# Patient Record
Sex: Male | Born: 1960 | Race: White | Hispanic: No | Marital: Married | Smoking: Never smoker
Health system: Southern US, Community
[De-identification: ages and names within clinical notes are randomized; demographics above are authoritative.]

## PROBLEM LIST (undated history)

## (undated) DIAGNOSIS — E785 Hyperlipidemia, unspecified: Secondary | ICD-10-CM

## (undated) DIAGNOSIS — J209 Acute bronchitis, unspecified: Secondary | ICD-10-CM

## (undated) DIAGNOSIS — M65839 Other synovitis and tenosynovitis, unspecified forearm: Secondary | ICD-10-CM

## (undated) DIAGNOSIS — R062 Wheezing: Secondary | ICD-10-CM

## (undated) DIAGNOSIS — K649 Unspecified hemorrhoids: Secondary | ICD-10-CM

## (undated) DIAGNOSIS — IMO0002 Reserved for concepts with insufficient information to code with codable children: Secondary | ICD-10-CM

## (undated) DIAGNOSIS — E1165 Type 2 diabetes mellitus with hyperglycemia: Secondary | ICD-10-CM

## (undated) DIAGNOSIS — H6690 Otitis media, unspecified, unspecified ear: Secondary | ICD-10-CM

## (undated) DIAGNOSIS — K429 Umbilical hernia without obstruction or gangrene: Secondary | ICD-10-CM

## (undated) DIAGNOSIS — I10 Essential (primary) hypertension: Secondary | ICD-10-CM

## (undated) DIAGNOSIS — M65849 Other synovitis and tenosynovitis, unspecified hand: Secondary | ICD-10-CM

## (undated) DIAGNOSIS — J309 Allergic rhinitis, unspecified: Secondary | ICD-10-CM

## (undated) HISTORY — DX: Essential (primary) hypertension: I10

## (undated) HISTORY — DX: Morbid (severe) obesity due to excess calories: E66.01

## (undated) HISTORY — DX: Unspecified hemorrhoids: K64.9

## (undated) HISTORY — DX: Acute bronchitis, unspecified: J20.9

## (undated) HISTORY — DX: Wheezing: R06.2

## (undated) HISTORY — PX: WISDOM TOOTH EXTRACTION: SHX21

## (undated) HISTORY — DX: Allergic rhinitis, unspecified: J30.9

## (undated) HISTORY — DX: Other synovitis and tenosynovitis, unspecified hand: M65.849

## (undated) HISTORY — DX: Type 2 diabetes mellitus with hyperglycemia: E11.65

## (undated) HISTORY — DX: Hyperlipidemia, unspecified: E78.5

## (undated) HISTORY — DX: Umbilical hernia without obstruction or gangrene: K42.9

## (undated) HISTORY — DX: Otitis media, unspecified, unspecified ear: H66.90

## (undated) HISTORY — DX: Reserved for concepts with insufficient information to code with codable children: IMO0002

## (undated) HISTORY — DX: Other synovitis and tenosynovitis, unspecified forearm: M65.839

---

## 1999-03-19 ENCOUNTER — Encounter: Payer: Self-pay | Admitting: Internal Medicine

## 1999-03-19 ENCOUNTER — Ambulatory Visit (HOSPITAL_COMMUNITY): Admission: RE | Admit: 1999-03-19 | Discharge: 1999-03-19 | Payer: Self-pay | Admitting: Internal Medicine

## 2004-08-21 ENCOUNTER — Ambulatory Visit: Payer: Self-pay | Admitting: Internal Medicine

## 2005-10-02 ENCOUNTER — Ambulatory Visit: Payer: Self-pay | Admitting: Family Medicine

## 2006-03-03 ENCOUNTER — Ambulatory Visit: Payer: Self-pay | Admitting: Internal Medicine

## 2006-03-10 ENCOUNTER — Ambulatory Visit: Payer: Self-pay | Admitting: Internal Medicine

## 2006-10-05 ENCOUNTER — Ambulatory Visit: Payer: Self-pay | Admitting: Internal Medicine

## 2006-12-07 ENCOUNTER — Ambulatory Visit: Payer: Self-pay | Admitting: Endocrinology

## 2007-07-03 ENCOUNTER — Encounter: Payer: Self-pay | Admitting: *Deleted

## 2007-07-03 DIAGNOSIS — I10 Essential (primary) hypertension: Secondary | ICD-10-CM

## 2007-07-03 DIAGNOSIS — K649 Unspecified hemorrhoids: Secondary | ICD-10-CM

## 2007-07-03 HISTORY — DX: Unspecified hemorrhoids: K64.9

## 2007-07-03 HISTORY — DX: Morbid (severe) obesity due to excess calories: E66.01

## 2007-07-03 HISTORY — DX: Essential (primary) hypertension: I10

## 2007-08-29 ENCOUNTER — Ambulatory Visit: Payer: Self-pay | Admitting: Internal Medicine

## 2007-08-29 LAB — CONVERTED CEMR LAB
ALT: 33 units/L (ref 0–53)
AST: 26 units/L (ref 0–37)
Albumin: 4 g/dL (ref 3.5–5.2)
Alkaline Phosphatase: 54 units/L (ref 39–117)
BUN: 9 mg/dL (ref 6–23)
Bacteria, UA: NEGATIVE
Basophils Absolute: 0 10*3/uL (ref 0.0–0.1)
Basophils Relative: 0.2 % (ref 0.0–1.0)
Bilirubin Urine: NEGATIVE
Bilirubin, Direct: 0.1 mg/dL (ref 0.0–0.3)
CO2: 28 meq/L (ref 19–32)
Calcium: 9.1 mg/dL (ref 8.4–10.5)
Chloride: 104 meq/L (ref 96–112)
Cholesterol: 159 mg/dL (ref 0–200)
Creatinine, Ser: 1 mg/dL (ref 0.4–1.5)
Crystals: NEGATIVE
Eosinophils Absolute: 0.3 10*3/uL (ref 0.0–0.6)
Eosinophils Relative: 3.1 % (ref 0.0–5.0)
GFR calc Af Amer: 104 mL/min
GFR calc non Af Amer: 86 mL/min
Glucose, Bld: 98 mg/dL (ref 70–99)
HCT: 40.6 % (ref 39.0–52.0)
HDL: 34.4 mg/dL — ABNORMAL LOW (ref >39.0)
Hemoglobin: 14.1 g/dL (ref 13.0–17.0)
Ketones, ur: NEGATIVE mg/dL
LDL Cholesterol: 107 mg/dL — ABNORMAL HIGH (ref 0–99)
Leukocytes, UA: NEGATIVE
Lymphocytes Relative: 26 % (ref 12.0–46.0)
MCHC: 34.7 g/dL (ref 30.0–36.0)
MCV: 86.8 fL (ref 78.0–100.0)
Monocytes Absolute: 0.6 10*3/uL (ref 0.2–0.7)
Monocytes Relative: 6 % (ref 3.0–11.0)
Neutro Abs: 6.1 10*3/uL (ref 1.4–7.7)
Neutrophils Relative %: 64.7 % (ref 43.0–77.0)
Nitrite: NEGATIVE
PSA: 0.3 ng/mL (ref 0.10–4.00)
Platelets: 300 10*3/uL (ref 150–400)
Potassium: 4.2 meq/L (ref 3.5–5.1)
RBC: 4.67 M/uL (ref 4.22–5.81)
RDW: 12.4 % (ref 11.5–14.6)
Sodium: 140 meq/L (ref 135–145)
Specific Gravity, Urine: 1.025 (ref 1.000–1.03)
Squamous Epithelial / HPF: NEGATIVE /lpf
TSH: 1.48 microintl units/mL (ref 0.35–5.50)
Total Bilirubin: 0.6 mg/dL (ref 0.3–1.2)
Total CHOL/HDL Ratio: 4.6
Total Protein, Urine: NEGATIVE mg/dL
Total Protein: 7.3 g/dL (ref 6.0–8.3)
Triglycerides: 87 mg/dL (ref 0–149)
Urine Glucose: NEGATIVE mg/dL
Urobilinogen, UA: 0.2 (ref 0.0–1.0)
VLDL: 17 mg/dL (ref 0–40)
WBC, UA: NONE SEEN cells/hpf
WBC: 9.4 10*3/uL (ref 4.5–10.5)
pH: 5.5 (ref 5.0–8.0)

## 2007-09-06 ENCOUNTER — Ambulatory Visit: Payer: Self-pay | Admitting: Internal Medicine

## 2007-09-06 DIAGNOSIS — E785 Hyperlipidemia, unspecified: Secondary | ICD-10-CM | POA: Insufficient documentation

## 2007-09-06 DIAGNOSIS — J309 Allergic rhinitis, unspecified: Secondary | ICD-10-CM

## 2007-09-06 HISTORY — DX: Allergic rhinitis, unspecified: J30.9

## 2007-09-06 HISTORY — DX: Hyperlipidemia, unspecified: E78.5

## 2007-11-16 ENCOUNTER — Ambulatory Visit: Payer: Self-pay | Admitting: Internal Medicine

## 2007-11-16 DIAGNOSIS — J209 Acute bronchitis, unspecified: Secondary | ICD-10-CM

## 2007-11-16 HISTORY — DX: Acute bronchitis, unspecified: J20.9

## 2008-06-23 ENCOUNTER — Emergency Department (HOSPITAL_COMMUNITY): Admission: EM | Admit: 2008-06-23 | Discharge: 2008-06-23 | Payer: Self-pay | Admitting: Emergency Medicine

## 2008-07-30 ENCOUNTER — Ambulatory Visit: Payer: Self-pay | Admitting: Internal Medicine

## 2008-07-30 DIAGNOSIS — IMO0002 Reserved for concepts with insufficient information to code with codable children: Secondary | ICD-10-CM | POA: Insufficient documentation

## 2008-07-30 HISTORY — DX: Reserved for concepts with insufficient information to code with codable children: IMO0002

## 2008-07-30 LAB — CONVERTED CEMR LAB
ALT: 27 units/L (ref 0–53)
AST: 23 units/L (ref 0–37)
Albumin: 3.9 g/dL (ref 3.5–5.2)
Alkaline Phosphatase: 51 units/L (ref 39–117)
Bilirubin, Direct: 0.2 mg/dL (ref 0.0–0.3)
Cholesterol: 150 mg/dL (ref 0–200)
HDL: 30.1 mg/dL — ABNORMAL LOW (ref >39.0)
LDL Cholesterol: 92 mg/dL (ref 0–99)
PSA: 1.21 ng/mL (ref 0.10–4.00)
Total Bilirubin: 0.6 mg/dL (ref 0.3–1.2)
Total CHOL/HDL Ratio: 5
Total Protein: 7.2 g/dL (ref 6.0–8.3)
Triglycerides: 141 mg/dL (ref 0–149)
VLDL: 28 mg/dL (ref 0–40)

## 2009-05-08 ENCOUNTER — Telehealth (INDEPENDENT_AMBULATORY_CARE_PROVIDER_SITE_OTHER): Payer: Self-pay | Admitting: *Deleted

## 2009-05-09 ENCOUNTER — Telehealth (INDEPENDENT_AMBULATORY_CARE_PROVIDER_SITE_OTHER): Payer: Self-pay | Admitting: *Deleted

## 2009-05-12 ENCOUNTER — Telehealth: Payer: Self-pay | Admitting: Internal Medicine

## 2009-06-06 ENCOUNTER — Telehealth: Payer: Self-pay | Admitting: Internal Medicine

## 2009-06-16 ENCOUNTER — Ambulatory Visit: Payer: Self-pay | Admitting: Internal Medicine

## 2009-06-17 LAB — CONVERTED CEMR LAB
ALT: 39 units/L (ref 0–53)
AST: 37 units/L (ref 0–37)
Albumin: 3.8 g/dL (ref 3.5–5.2)
Alkaline Phosphatase: 53 units/L (ref 39–117)
BUN: 7 mg/dL (ref 6–23)
Basophils Absolute: 0 10*3/uL (ref 0.0–0.1)
Basophils Relative: 0.2 % (ref 0.0–3.0)
Bilirubin Urine: NEGATIVE
Bilirubin, Direct: 0.1 mg/dL (ref 0.0–0.3)
CO2: 29 meq/L (ref 19–32)
Calcium: 9 mg/dL (ref 8.4–10.5)
Chloride: 106 meq/L (ref 96–112)
Cholesterol: 160 mg/dL (ref 0–200)
Creatinine, Ser: 1 mg/dL (ref 0.4–1.5)
Eosinophils Absolute: 0.3 10*3/uL (ref 0.0–0.7)
Eosinophils Relative: 4.7 % (ref 0.0–5.0)
GFR calc non Af Amer: 85.2 mL/min (ref >60)
Glucose, Bld: 111 mg/dL — ABNORMAL HIGH (ref 70–99)
HCT: 41.5 % (ref 39.0–52.0)
HDL: 36 mg/dL — ABNORMAL LOW (ref >39.00)
Hemoglobin: 14.2 g/dL (ref 13.0–17.0)
Ketones, ur: NEGATIVE mg/dL
LDL Cholesterol: 107 mg/dL — ABNORMAL HIGH (ref 0–99)
Leukocytes, UA: NEGATIVE
Lymphocytes Relative: 33.1 % (ref 12.0–46.0)
Lymphs Abs: 2.3 10*3/uL (ref 0.7–4.0)
MCHC: 34.1 g/dL (ref 30.0–36.0)
MCV: 88.8 fL (ref 78.0–100.0)
Monocytes Absolute: 0.5 10*3/uL (ref 0.1–1.0)
Monocytes Relative: 7.3 % (ref 3.0–12.0)
Neutro Abs: 3.9 10*3/uL (ref 1.4–7.7)
Neutrophils Relative %: 54.7 % (ref 43.0–77.0)
Nitrite: NEGATIVE
PSA: 0.64 ng/mL (ref 0.10–4.00)
Platelets: 236 10*3/uL (ref 150.0–400.0)
Potassium: 3.8 meq/L (ref 3.5–5.1)
RBC: 4.68 M/uL (ref 4.22–5.81)
RDW: 12.5 % (ref 11.5–14.6)
Sodium: 142 meq/L (ref 135–145)
Specific Gravity, Urine: 1.025 (ref 1.000–1.030)
TSH: 1.91 microintl units/mL (ref 0.35–5.50)
Total Bilirubin: 0.8 mg/dL (ref 0.3–1.2)
Total CHOL/HDL Ratio: 4
Total Protein, Urine: NEGATIVE mg/dL
Total Protein: 7.5 g/dL (ref 6.0–8.3)
Triglycerides: 83 mg/dL (ref 0.0–149.0)
Urine Glucose: 250 mg/dL
Urobilinogen, UA: 0.2 (ref 0.0–1.0)
VLDL: 16.6 mg/dL (ref 0.0–40.0)
WBC: 7 10*3/uL (ref 4.5–10.5)
pH: 6 (ref 5.0–8.0)

## 2009-06-18 ENCOUNTER — Ambulatory Visit: Payer: Self-pay | Admitting: Internal Medicine

## 2009-09-09 ENCOUNTER — Ambulatory Visit: Payer: Self-pay | Admitting: Internal Medicine

## 2009-09-09 DIAGNOSIS — R062 Wheezing: Secondary | ICD-10-CM

## 2009-09-09 HISTORY — DX: Wheezing: R06.2

## 2009-11-10 ENCOUNTER — Ambulatory Visit: Payer: Self-pay | Admitting: Internal Medicine

## 2009-11-10 DIAGNOSIS — H6690 Otitis media, unspecified, unspecified ear: Secondary | ICD-10-CM | POA: Insufficient documentation

## 2009-11-10 HISTORY — DX: Otitis media, unspecified, unspecified ear: H66.90

## 2010-06-11 ENCOUNTER — Ambulatory Visit: Payer: Self-pay | Admitting: Internal Medicine

## 2010-06-11 DIAGNOSIS — M65839 Other synovitis and tenosynovitis, unspecified forearm: Secondary | ICD-10-CM

## 2010-06-11 DIAGNOSIS — M65849 Other synovitis and tenosynovitis, unspecified hand: Secondary | ICD-10-CM

## 2010-06-11 HISTORY — DX: Other synovitis and tenosynovitis, unspecified forearm: M65.839

## 2010-08-31 ENCOUNTER — Ambulatory Visit: Payer: Self-pay | Admitting: Internal Medicine

## 2010-09-30 ENCOUNTER — Ambulatory Visit: Payer: Self-pay | Admitting: Internal Medicine

## 2010-09-30 DIAGNOSIS — E559 Vitamin D deficiency, unspecified: Secondary | ICD-10-CM

## 2010-10-02 LAB — CONVERTED CEMR LAB
ALT: 33 units/L (ref 0–53)
Alkaline Phosphatase: 62 units/L (ref 39–117)
Basophils Relative: 0.2 % (ref 0.0–3.0)
Bilirubin, Direct: 0.1 mg/dL (ref 0.0–0.3)
Chloride: 105 meq/L (ref 96–112)
Cholesterol: 136 mg/dL (ref 0–200)
Creatinine, Ser: 0.9 mg/dL (ref 0.4–1.5)
Eosinophils Relative: 3.6 % (ref 0.0–5.0)
LDL Cholesterol: 89 mg/dL (ref 0–99)
Leukocytes, UA: NEGATIVE
MCV: 88.6 fL (ref 78.0–100.0)
Monocytes Absolute: 0.4 10*3/uL (ref 0.1–1.0)
Neutrophils Relative %: 64.9 % (ref 43.0–77.0)
Nitrite: NEGATIVE
PSA: 0.38 ng/mL (ref 0.10–4.00)
Potassium: 4.5 meq/L (ref 3.5–5.1)
RBC: 4.59 M/uL (ref 4.22–5.81)
Sodium: 139 meq/L (ref 135–145)
Total CHOL/HDL Ratio: 4
Total Protein: 7 g/dL (ref 6.0–8.3)
Urobilinogen, UA: 0.2 (ref 0.0–1.0)
WBC: 8.5 10*3/uL (ref 4.5–10.5)

## 2010-10-06 ENCOUNTER — Ambulatory Visit
Admission: RE | Admit: 2010-10-06 | Discharge: 2010-10-06 | Payer: Self-pay | Source: Home / Self Care | Attending: Internal Medicine | Admitting: Internal Medicine

## 2010-10-06 ENCOUNTER — Encounter: Payer: Self-pay | Admitting: Internal Medicine

## 2010-10-06 LAB — CONVERTED CEMR LAB
HDL goal, serum: 40 mg/dL
LDL Goal: 130 mg/dL

## 2010-11-03 NOTE — Assessment & Plan Note (Signed)
Summary: COUGH--CONGESTION  STC   Vital Signs:  Patient profile:   51 year old male Height:      71 inches Weight:      337 pounds BMI:     47.17 O2 Sat:      96 % on Room air Temp:     98.2 degrees F oral Pulse rate:   85 / minute BP sitting:   108 / 60  (left arm) Cuff size:   large  Vitals Entered ByZella Ball Ewing (November 10, 2009 4:06 PM)  O2 Flow:  Room air CC: congestion and cough/RE   CC:  congestion and cough/RE.  History of Present Illness: here with 3 days onset severe URI adn left ear pain , pressure, feverish with mild ST and prod cough greenish sputum; no wheezing and Pt denies CP, sob, doe, wheezing, orthopnea, pnd, worsening LE edema, palps, dizziness or syncope   Overall good complaicne with meds, tolerating well, no other signfiicant symtpoms.  Pt denies new neuro symptoms such as headache, facial or extremity weakness   Problems Prior to Update: 1)  Bronchitis-acute  (ICD-466.0) 2)  Otitis Media, Acute, Left  (ICD-382.9) 3)  Wheezing  (ICD-786.07) 4)  Hepatotoxicity, Drug-induced, Risk of  (ICD-V58.69) 5)  Special Screening Malig Neoplasms Other Sites  (ICD-V76.49) 6)  Knee Sprain, Left  (ICD-844.9) 7)  Asthmatic Bronchitis, Acute  (ICD-466.0) 8)  Preventive Health Care  (ICD-V70.0) 9)  Allergic Rhinitis  (ICD-477.9) 10)  Hyperlipidemia  (ICD-272.4) 11)  Special Screening Malignant Neoplasm of Prostate  (ICD-V76.44) 12)  Routine General Medical Exam@[facility]  (ICD-V70.0) 13)  Obesity, Morbid  (ICD-278.01) 14)  Hemorrhoids  (ICD-455.6) 15)  Hypertension  (ICD-401.9)  Medications Prior to Update: 1)  Lisinopril 20 Mg  Tabs (Lisinopril) .... Take 1 By Mouth Once Daily 2)  Crestor 20 Mg  Tabs (Rosuvastatin Calcium) .Marland Kitchen.. 1 By Mouth Once Daily 3)  Ecotrin Low Strength 81 Mg  Tbec (Aspirin) .Marland Kitchen.. 1 By Mouth Qd 4)  Ibuprofen 800 Mg Tabs (Ibuprofen) .Marland Kitchen.. 1 By Mouth Q 6 Hr As Needed 5)  Azithromycin 250 Mg Tabs (Azithromycin) .... 2po Qd For 1 Day, Then  1po Qd For 4days, Then Stop 6)  Hydrocodone-Homatropine 5-1.5 Mg/46ml Syrp (Hydrocodone-Homatropine) .Marland Kitchen.. 1 Tsp By Mouth Q 6 Hrs As Needed Cough 7)  Prednisone 10 Mg Tabs (Prednisone) .... 3po Qd For 3days, Then 2po Qd For 3days, Then 1po Qd For 3days, Then Stop  Current Medications (verified): 1)  Lisinopril 20 Mg  Tabs (Lisinopril) .... Take 1 By Mouth Once Daily 2)  Crestor 20 Mg  Tabs (Rosuvastatin Calcium) .Marland Kitchen.. 1 By Mouth Once Daily 3)  Ecotrin Low Strength 81 Mg  Tbec (Aspirin) .Marland Kitchen.. 1 By Mouth Qd 4)  Ibuprofen 800 Mg Tabs (Ibuprofen) .Marland Kitchen.. 1 By Mouth Q 6 Hr As Needed 5)  Clarithromycin 500 Mg Tabs (Clarithromycin) .Marland Kitchen.. 1 By Mouth Two Times A Day 6)  Hydrocodone-Homatropine 5-1.5 Mg/38ml Syrp (Hydrocodone-Homatropine) .Marland Kitchen.. 1 Tsp By Mouth Q 6 Hrs As Needed Cough  Allergies (verified): No Known Drug Allergies  Past History:  Past Medical History: Last updated: 09/06/2007 Hypertension Hyperlipidemia Allergic rhinitis  Past Surgical History: Last updated: 09/06/2007 Denies surgical history  Social History: Last updated: 06/18/2009 Never Smoked Alcohol use-no Married  Risk Factors: Smoking Status: never (09/06/2007)  Review of Systems       all otherwise negative per pt -  Physical Exam  General:  alert and overweight-appearing.  , mild ill  Head:  normocephalic and atraumatic.   Eyes:  vision grossly intact, pupils equal, and pupils round.   Ears:  bilat tms' red, sinus nontender , but left tm with severe red and bulging as well  Nose:  nasal dischargemucosal pallor and mucosal erythema.   Mouth:  pharyngeal erythema and fair dentition.   Neck:  supple and cervical lymphadenopathy.   Lungs:  normal respiratory effort, normal breath sounds, and no wheezes.   Heart:  normal rate and regular rhythm.   Extremities:  no edema, no erythema    Impression & Recommendations:  Problem # 1:  OTITIS MEDIA, ACUTE, LEFT (ICD-382.9)  His updated medication list for this  problem includes:    Ecotrin Low Strength 81 Mg Tbec (Aspirin) .Marland Kitchen... 1 by mouth qd    Ibuprofen 800 Mg Tabs (Ibuprofen) .Marland Kitchen... 1 by mouth q 6 hr as needed    Clarithromycin 500 Mg Tabs (Clarithromycin) .Marland Kitchen... 1 by mouth two times a day treat as above, f/u any worsening signs or symptoms   Problem # 2:  BRONCHITIS-ACUTE (ICD-466.0)  His updated medication list for this problem includes:    Clarithromycin 500 Mg Tabs (Clarithromycin) .Marland Kitchen... 1 by mouth two times a day    Hydrocodone-homatropine 5-1.5 Mg/18ml Syrp (Hydrocodone-homatropine) .Marland Kitchen... 1 tsp by mouth q 6 hrs as needed cough treat as above, f/u any worsening signs or symptoms   Problem # 3:  WHEEZING (ICD-786.07) none this visit, reassured, does not appear need for prednisone this visit, as before  Problem # 4:  HYPERTENSION (ICD-401.9)  His updated medication list for this problem includes:    Lisinopril 20 Mg Tabs (Lisinopril) .Marland Kitchen... Take 1 by mouth once daily  BP today: 108/60 Prior BP: 110/64 (09/09/2009)  Labs Reviewed: K+: 3.8 (06/16/2009) Creat: : 1.0 (06/16/2009)   Chol: 160 (06/16/2009)   HDL: 36.00 (06/16/2009)   LDL: 107 (06/16/2009)   TG: 83.0 (06/16/2009) stable overall by hx and exam, ok to continue meds/tx as is   Complete Medication List: 1)  Lisinopril 20 Mg Tabs (Lisinopril) .... Take 1 by mouth once daily 2)  Crestor 20 Mg Tabs (Rosuvastatin calcium) .Marland Kitchen.. 1 by mouth once daily 3)  Ecotrin Low Strength 81 Mg Tbec (Aspirin) .Marland Kitchen.. 1 by mouth qd 4)  Ibuprofen 800 Mg Tabs (Ibuprofen) .Marland Kitchen.. 1 by mouth q 6 hr as needed 5)  Clarithromycin 500 Mg Tabs (Clarithromycin) .Marland Kitchen.. 1 by mouth two times a day 6)  Hydrocodone-homatropine 5-1.5 Mg/22ml Syrp (Hydrocodone-homatropine) .Marland Kitchen.. 1 tsp by mouth q 6 hrs as needed cough  Patient Instructions: 1)  Please take all new medications as prescribed 2)  Continue all previous medications as before this visit  3)  You can also use Mucinex OTC or it's generic for congestion  4)  Please  schedule a follow-up appointment in Sept 2011 with CPX labs for yearly exam, or sooner if needed Prescriptions: CLARITHROMYCIN 500 MG TABS (CLARITHROMYCIN) 1 by mouth two times a day  #20 x 0   Entered and Authorized by:   [name] MD   Signed by:   [name] MD on 11/10/2009   Method used:   Print then Give to Patient   RxID:   [id number] HYDROCODONE-HOMATROPINE 5-1.5 MG/5ML SYRP (HYDROCODONE-HOMATROPINE) 1 tsp by mouth q 6 hrs as needed cough  #6 oz x 1   Entered and Authorized by:   [name] MD   Signed by:   [name] MD on 11/10/2009   Method used:  Print then Give to Patient   RxID:   [id number]

## 2010-11-03 NOTE — Assessment & Plan Note (Signed)
Summary: hand pain/cd   Vital Signs:  Patient profile:   50 year old male Height:      71.5 inches Weight:      342 pounds BMI:     47.20 O2 Sat:      96 % on Room air Temp:     98.2 degrees F oral Pulse rate:   78 / minute BP sitting:   110 / 62  (left arm) Cuff size:   large  Vitals Entered By: Zella Ball Ewing CMA Duncan Dull) (June 11, 2010 11:37 AM)  O2 Flow:  Room air  CC: Hand pain/RE   CC:  Hand pain/RE.  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  Also pain on the right radiates to the right lateral epicondylar region when the wrist is extended.  Has gotten away from taking his statin, but wants to re-start.  Denies polydipsia, polyuria.  Pt denies CP, worsening sob, doe, wheezing, orthopnea, pnd, worsening LE edema, palps, dizziness or syncope  Pt denies new neuro symptoms such as headache, facial or extremity weakness  No fever, wt loss, night sweats, loss of appetite or other constitutional symptoms   Problems Prior to Update: 1)  Other Tenosynovitis of Hand and Wrist  (ICD-727.05) 2)  Otitis Media, Acute, Left  (ICD-382.9) 3)  Wheezing  (ICD-786.07) 4)  Hepatotoxicity, Drug-induced, Risk of  (ICD-V58.69) 5)  Special Screening Malig Neoplasms Other Sites  (ICD-V76.49) 6)  Knee Sprain, Left  (ICD-844.9) 7)  Asthmatic Bronchitis, Acute  (ICD-466.0) 8)  Preventive Health Care  (ICD-V70.0) 9)   Allergic Rhinitis  (ICD-477.9) 10)  Hyperlipidemia  (ICD-272.4) 11)  Special Screening Malignant Neoplasm of Prostate  (ICD-V76.44) 12)  Routine General Medical Exam@[facility]  (ICD-V70.0) 13)  Obesity, Morbid  (ICD-278.01) 14)  Hemorrhoids  (ICD-455.6) 15)  Hypertension  (ICD-401.9)  Medications Prior to Update: 1)  Lisinopril 20 Mg  Tabs (Lisinopril) .... Take 1 By Mouth Once Daily 2)  Crestor 20 Mg  Tabs (Rosuvastatin Calcium) .Marland Kitchen.. 1 By Mouth Once Daily 3)  Ecotrin Low Strength 81 Mg  Tbec (Aspirin) .Marland Kitchen.. 1 By Mouth Qd 4)  Ibuprofen 800 Mg Tabs (Ibuprofen) .Marland Kitchen.. 1 By Mouth Q 6 Hr As Needed 5)  Clarithromycin 500 Mg Tabs (Clarithromycin) .Marland Kitchen.. 1 By Mouth Two Times A Day 6)  Hydrocodone-Homatropine 5-1.5 Mg/20ml Syrp (Hydrocodone-Homatropine) .Marland Kitchen.. 1 Tsp By Mouth Q 6 Hrs As Needed Cough  Current Medications (verified): 1)  Lisinopril 20 Mg  Tabs (Lisinopril) .... Take 1 By Mouth Once Daily 2)  Crestor 20 Mg  Tabs (Rosuvastatin Calcium) .Marland Kitchen.. 1 By Mouth Once Daily 3)  Ecotrin Low Strength 81 Mg  Tbec (Aspirin) .Marland Kitchen.. 1 By Mouth Qd 4)  Ibuprofen 800 Mg Tabs (Ibuprofen) .Marland Kitchen.. 1 By Mouth Q 6 Hr As Needed 5)  Prednisone 10  Mg Tabs (Prednisone) .... 4po Qd For 3days, Then 3po Qd For 3days, Then 2po Qd For 3days, Then 1po Qd For 3 Days, Then Stop 6)  Tramadol Hcl 50 Mg Tabs (Tramadol Hcl) .Marland Kitchen.. 1 By Mouth Q 6 Hrs As Needed Pain  Allergies (verified): No Known Drug Allergies  Past History:  Past Medical History: Last updated: 09/06/2007 Hypertension Hyperlipidemia Allergic rhinitis  Past Surgical History: Last updated: 09/06/2007 Denies surgical history  Social History: Last updated: 06/18/2009 Never Smoked Alcohol use-no Married  Risk Factors: Smoking Status: never (09/06/2007)  Review of Systems       all otherwise negative per pt -    Physical Exam  General:  alert and overweight-appearing.  , mild ill  Head:  normocephalic and atraumatic.   Eyes:  vision grossly  intact, pupils equal, and pupils round.   Ears:  R ear normal and L ear normal.   Nose:  no external deformity and no nasal discharge.   Mouth:  no gingival abnormalities and pharynx pink and moist.   Neck:  supple and no masses.   Lungs:  normal respiratory effort and normal breath sounds.   Heart:  normal rate and regular rhythm.   Msk:  diffuse tender of all tendons to post right hand/wrist/arm Extremities:  no edema, no erythema  Neurologic:  strength normal in all extremities, sensation intact to light touch, and gait normal.   Skin:  color normal and no rashes.   Psych:  slightly anxious.     Impression & Recommendations:  Problem # 1:  OTHER TENOSYNOVITIS OF HAND AND WRIST (ICD-727.05)  bilat modearte on the right, mild on the left, likely due to overuse;  ok for depo shot today, and pred pack for home,;  could consider EMG/NCS but doubt needed at this time  Orders: Depo- Medrol 40mg  (J1030) Depo- Medrol 80mg  (J1040) Admin of Therapeutic Inj  intramuscular or subcutaneous (78295)  Problem # 2:  HYPERTENSION (ICD-401.9)  His updated medication list for this problem includes:    Lisinopril 20 Mg Tabs (Lisinopril) .Marland Kitchen... Take 1 by mouth once daily  BP today: 110/62 Prior BP: 108/60 (11/10/2009)  Labs Reviewed: K+: 3.8 (06/16/2009) Creat: : 1.0 (06/16/2009)   Chol: 160 (06/16/2009)   HDL: 36.00 (06/16/2009)   LDL: 107 (06/16/2009)   TG: 83.0 (06/16/2009) stable overall by hx and exam, ok to continue meds/tx as is   Problem # 3:  HYPERLIPIDEMIA (ICD-272.4)  His updated medication list for this problem includes:    Crestor 20 Mg Tabs (Rosuvastatin calcium) .Marland Kitchen... 1 by mouth once daily  Labs Reviewed: SGOT: 37 (06/16/2009)   SGPT: 39 (06/16/2009)   HDL:36.00 (06/16/2009), 30.1 (07/30/2008)  LDL:107 (06/16/2009), 92 (62/13/0865)  Chol:160 (06/16/2009), 150 (07/30/2008)  Trig:83.0 (06/16/2009), 141 (07/30/2008) re-start med - cont diet, f/u lipids with next visit  Complete  Medication List: 1)  Lisinopril 20 Mg Tabs (Lisinopril) .... Take 1 by mouth once daily 2)  Crestor 20 Mg Tabs (Rosuvastatin calcium) .Marland Kitchen.. 1 by mouth once daily 3)  Ecotrin Low Strength 81 Mg Tbec (Aspirin) .Marland Kitchen.. 1 by mouth qd 4)  Ibuprofen 800 Mg Tabs (Ibuprofen) .Marland Kitchen.. 1 by mouth q 6 hr as needed 5)  Prednisone 10 Mg Tabs (Prednisone) .... 4po qd for 3days, then 3po qd for 3days, then 2po qd for 3days, then 1po qd for 3 days, then stop 6)  Tramadol Hcl 50 Mg Tabs (Tramadol hcl) .Marland Kitchen.. 1 by mouth q 6 hrs as needed pain  Patient Instructions: 1)  you had the steroid shot today 2)  Please take all new medications as prescribed  - the prednisone for inflammation, and tramadol for pain 3)  please do not take the ibuprofen when you take the prednisone due to risk of stomach ulcers 4)  Continue all previous medications as before this visit 5)  Please schedule a follow-up appointment in 1 month with CPX labs Prescriptions: CRESTOR 20 MG  TABS (ROSUVASTATIN CALCIUM) 1 by mouth once daily  #30 x 0   Entered and Authorized by:   [name] MD   Signed by:   [name] MD on 06/11/2010   Method used:   Print then Give to Patient   RxID:   [id number] LISINOPRIL 20 MG  TABS (LISINOPRIL) take 1 by mouth once daily  #30 x 0   Entered and Authorized by:   [name] MD   Signed by:   [name] MD on 06/11/2010   Method used:   Print then Give to Patient   RxID:   [id number] TRAMADOL HCL 50 MG TABS (TRAMADOL HCL) 1 by mouth q 6 hrs as needed pain  #60 x 0   Entered and Authorized by:   [name] MD   Signed by:   [name] MD on 06/11/2010   Method used:   Print then Give to Patient   RxID:   [id number] PREDNISONE 10 MG TABS (PREDNISONE) 4po qd for 3days, then 3po qd for 3days, then 2po qd for 3days, then 1po qd for 3 days, then stop  #30 x 0   Entered and Authorized by:   [name] MD   Signed by:   [name] MD on 06/11/2010   Method used:   Print then Give to  Patient   RxID:   [id number]    Medication Administration  Injection # 1:    Medication: Depo- Medrol 40mg     Diagnosis: OTHER TENOSYNOVITIS OF HAND AND WRIST (ICD-727.05)    Route: IM    Site: LUOQ gluteus    Exp Date: 01/2013    Lot #: [id number]    Mfr: [employer]    Comments: patient received 120mg  Depo-Medrol    Patient tolerated injection without complications    Given by: Zella Ball Ewing CMA (AAMA) (June 11, 2010 1:03 PM)  Injection # 2:    Medication: Depo- Medrol 80mg     Diagnosis: OTHER TENOSYNOVITIS OF HAND AND WRIST (ICD-727.05)    Route: IM    Site: LUOQ gluteus    Exp Date: 01/2013    Lot #: [id number]    Mfr: [employer]    Given by: Zella Ball Ewing CMA [facility] Dull) (June 11, 2010 1:03 PM)  Orders Added: 1)  Depo- Medrol 40mg  [J1030] 2)  Depo- Medrol 80mg  [J1040] 3)  Admin of Therapeutic Inj  intramuscular or subcutaneous [96372] 4)  Est. Patient Level IV [42595]

## 2010-11-03 NOTE — Assessment & Plan Note (Signed)
Summary: cold/cd   Vital Signs:  Patient profile:   50 year old male Height:      71.5 inches Weight:      336.25 pounds BMI:     46.41 O2 Sat:      97 % on Room air Temp:     98.6 degrees F oral Pulse rate:   93 / minute BP sitting:   112 / 72  (left arm) Cuff size:   large  Vitals Entered By: Zella Ball Ewing CMA (AAMA) (August 31, 2010 11:07 AM)  O2 Flow:  Room air CC: Cough and congestion/RE   CC:  Cough and congestion/RE.  History of Present Illness: here to f/u;  overall doing ok but with acute onset midl to mod  3 days fever, ST, with prod cough with greenish sputum, but Pt denies CP, worsening sob, doe, wheezing, orthopnea, pnd, worsening LE edema, palps, dizziness or syncope  Pt denies new neuro symptoms such as headache, facial or extremity weakness  Pt denies polydipsia, polyuria   Overall good compliance with meds, trying to follow low chol diet, wt stable, little excercise however .  No recent wt loss, night sweats, loss of appetite or other constitutional symptoms   Preventive Screening-Counseling & Management      Drug Use:  no.    Problems Prior to Update: 1)  Bronchitis-acute  (ICD-466.0) 2)  Other Tenosynovitis of Hand and Wrist  (ICD-727.05) 3)  Otitis Media, Acute, Left  (ICD-382.9) 4)  Wheezing  (ICD-786.07) 5)  Hepatotoxicity, Drug-induced, Risk of  (ICD-V58.69) 6)  Special Screening Malig Neoplasms Other Sites  (ICD-V76.49) 7)  Knee Sprain, Left  (ICD-844.9) 8)  Asthmatic Bronchitis, Acute  (ICD-466.0) 9)  Preventive Health Care  (ICD-V70.0) 10)  Allergic Rhinitis  (ICD-477.9) 11)  Hyperlipidemia  (ICD-272.4) 12)  Special Screening Malignant Neoplasm of Prostate  (ICD-V76.44) 13)  Routine General Medical Exam[facility]  (ICD-V70.0) 14)  Obesity, Morbid  (ICD-278.01) 15)  Hemorrhoids  (ICD-455.6) 16)  Hypertension  (ICD-401.9)  Medications Prior to Update: 1)  Lisinopril 20 Mg  Tabs (Lisinopril) .... Take 1 By Mouth Once Daily 2)  Crestor 20  Mg  Tabs (Rosuvastatin Calcium) .Marland Kitchen.. 1 By Mouth Once Daily 3)  Ecotrin Low Strength 81 Mg  Tbec (Aspirin) .Marland Kitchen.. 1 By Mouth Qd 4)  Ibuprofen 800 Mg Tabs (Ibuprofen) .Marland Kitchen.. 1 By Mouth Q 6 Hr As Needed 5)  Prednisone 10 Mg Tabs (Prednisone) .... 4po Qd For 3days, Then 3po Qd For 3days, Then 2po Qd For 3days, Then 1po Qd For 3 Days, Then Stop 6)  Tramadol Hcl 50 Mg Tabs (Tramadol Hcl) .Marland Kitchen.. 1 By Mouth Q 6 Hrs As Needed Pain  Current Medications (verified): 1)  Lisinopril 20 Mg  Tabs (Lisinopril) .... Take 1 By Mouth Once Daily 2)  Crestor 20 Mg  Tabs (Rosuvastatin Calcium) .Marland Kitchen.. 1 By Mouth Once Daily 3)  Ecotrin Low Strength 81 Mg  Tbec (Aspirin) .Marland Kitchen.. 1 By Mouth Qd 4)  Ibuprofen 800 Mg Tabs (Ibuprofen) .Marland Kitchen.. 1 By Mouth Q 6 Hr As Needed 5)  Tramadol Hcl 50 Mg Tabs (Tramadol Hcl) .Marland Kitchen.. 1 By Mouth Q 6 Hrs As Needed Pain 6)  Azithromycin 250 Mg Tabs (Azithromycin) .... 2po Qd For 1 Day, Then 1po Qd For 4days, Then Stop 7)  Hydrocodone-Homatropine 5-1.5 Mg/28ml Syrp (Hydrocodone-Homatropine) .Marland Kitchen.. 1 Tsp By Mouth Q 6 Hrs As Needed  Allergies (verified): No Known Drug Allergies  Past History:  Past Medical History: Last updated: 09/06/2007 Hypertension Hyperlipidemia  Allergic rhinitis  Past Surgical History: Last updated: 09/06/2007 Denies surgical history  Social History: Last updated: 08/31/2010 Never Smoked Alcohol use-no Married Drug use-no  Risk Factors: Smoking Status: never (09/06/2007)  Social History: Never Smoked Alcohol use-no Married Drug use-no Drug Use:  no  Review of Systems       all otherwise negative per pt -    Physical Exam  General:  alert and overweight-appearing.  , mild ill  Head:  normocephalic and atraumatic.   Eyes:  vision grossly intact, pupils equal, and pupils round.   Ears:  bilat tm's red, sinus nontender Nose:  nasal dischargemucosal pallor and mucosal edema.   Mouth:  pharyngeal erythema and fair dentition.   Neck:  supple and cervical  lymphadenopathy.   Lungs:  normal respiratory effort and normal breath sounds.   Heart:  normal rate and regular rhythm.   Extremities:  no edema, no erythema    Impression & Recommendations:  Problem # 1:  BRONCHITIS-ACUTE (ICD-466.0)  no wheezing today - treat as above, f/u any worsening signs or symptoms   His updated medication list for this problem includes:    Azithromycin 250 Mg Tabs (Azithromycin) .Marland Kitchen... 2po qd for 1 day, then 1po qd for 4days, then stop    Hydrocodone-homatropine 5-1.5 Mg/64ml Syrp (Hydrocodone-homatropine) .Marland Kitchen... 1 tsp by mouth q 6 hrs as needed  Problem # 2:  HYPERTENSION (ICD-401.9)  His updated medication list for this problem includes:    Lisinopril 20 Mg Tabs (Lisinopril) .Marland Kitchen... Take 1 by mouth once daily  BP today: 112/72 Prior BP: 110/62 (06/11/2010)  Labs Reviewed: K+: 3.8 (06/16/2009) Creat: : 1.0 (06/16/2009)   Chol: 160 (06/16/2009)   HDL: 36.00 (06/16/2009)   LDL: 107 (06/16/2009)   TG: 83.0 (06/16/2009) stable overall by hx and exam, ok to continue meds/tx as is   Problem # 3:  HYPERLIPIDEMIA (ICD-272.4)  His updated medication list for this problem includes:    Crestor 20 Mg Tabs (Rosuvastatin calcium) .Marland Kitchen... 1 by mouth once daily  Labs Reviewed: SGOT: 37 (06/16/2009)   SGPT: 39 (06/16/2009)   HDL:36.00 (06/16/2009), 30.1 (07/30/2008)  LDL:107 (06/16/2009), 92 (94/85/4627)  Chol:160 (06/16/2009), 150 (07/30/2008)  Trig:83.0 (06/16/2009), 141 (07/30/2008) d/w pt - stable overall by hx and exam, ok to continue meds/tx as is , Pt to continue diet efforts, good med tolerance; to check labs next visit - goal LDL less than 100  Complete Medication List: 1)  Lisinopril 20 Mg Tabs (Lisinopril) .... Take 1 by mouth once daily 2)  Crestor 20 Mg Tabs (Rosuvastatin calcium) .Marland Kitchen.. 1 by mouth once daily 3)  Ecotrin Low Strength 81 Mg Tbec (Aspirin) .Marland Kitchen.. 1 by mouth qd 4)  Ibuprofen 800 Mg Tabs (Ibuprofen) .Marland Kitchen.. 1 by mouth q 6 hr as needed 5)  Tramadol  Hcl 50 Mg Tabs (Tramadol hcl) .Marland Kitchen.. 1 by mouth q 6 hrs as needed pain 6)  Azithromycin 250 Mg Tabs (Azithromycin) .... 2po qd for 1 day, then 1po qd for 4days, then stop 7)  Hydrocodone-homatropine 5-1.5 Mg/36ml Syrp (Hydrocodone-homatropine) .Marland Kitchen.. 1 tsp by mouth q 6 hrs as needed  Patient Instructions: 1)  Please take all new medications as prescribed 2)  Continue all previous medications as before this visit  3)  Please schedule a follow-up appointment in 2 months with CPX and labs Prescriptions: HYDROCODONE-HOMATROPINE 5-1.5 MG/5ML SYRP (HYDROCODONE-HOMATROPINE) 1 tsp by mouth q 6 hrs as needed  #6oz x 1   Entered and Authorized by:   [name]  MD   Signed by:   [name] MD on 08/31/2010   Method used:   Print then Give to Patient   RxID:   [id number] AZITHROMYCIN 250 MG TABS (AZITHROMYCIN) 2po qd for 1 day, then 1po qd for 4days, then stop  #6 x 1   Entered and Authorized by:   [name] MD   Signed by:   [name] MD on 08/31/2010   Method used:   Print then Give to Patient   RxID:   [id number]    Orders Added: 1)  Est. Patient Level IV [84696]

## 2010-11-05 NOTE — Assessment & Plan Note (Signed)
Summary: PHYSICAL  STC   Vital Signs:  Patient profile:   50 year old male Height:      71.5 inches Weight:      337.50 pounds BMI:     46.58 O2 Sat:      99 % on Room air Temp:     98.3 degrees F oral Pulse rate:   79 / minute BP sitting:   118 / 60  (left arm) Cuff size:   large  Vitals Entered By: Zella Ball Ewing CMA (AAMA) (October 06, 2010 1:11 PM)  O2 Flow:  Room air  CC: ADult Physical/RE, Lipid Management   CC:  ADult Physical/RE and Lipid Management.  History of Present Illness: here for wellness, overall doing ok;  Pt denies CP, worsening sob, doe, wheezing, orthopnea, pnd, worsening LE edema, palps, dizziness or syncope  Pt denies new neuro symptoms such as headache, facial or extremity weakness  Pt denies polydipsia, polyuria   Overall good compliance with meds, trying to follow low chol diet, wt stable, little excercise however .  No fever, wt loss, night sweats, loss of appetite or other constitutional symptoms  Denies worsening depressive symptoms, suicidal ideation, or panic.   Overall good compliance with meds, and good tolerability.  Pt states good ability with ADL's, low fall risk, home safety reviewed and adequate, no significant change in hearing or vision, trying to follow lower chol diet, and occasionally active only with regular excercise.  Does have mild nasal allergy symtpoms and post nasal gtt/mild non prod cough over several months   Problems Prior to Update: 1)  Other Tenosynovitis of Hand and Wrist  (ICD-727.05) 2)  Otitis Media, Acute, Left  (ICD-382.9) 3)  Wheezing  (ICD-786.07) 4)  Hepatotoxicity, Drug-induced, Risk of  (ICD-V58.69) 5)  Special Screening Malig Neoplasms Other Sites  (ICD-V76.49) 6)  Knee Sprain, Left  (ICD-844.9) 7)  Asthmatic Bronchitis, Acute  (ICD-466.0) 8)  Preventive Health Care  (ICD-V70.0) 9)  Allergic Rhinitis  (ICD-477.9) 10)  Hyperlipidemia  (ICD-272.4) 11)  Special Screening Malignant Neoplasm of Prostate   (ICD-V76.44) 12)  Routine General Medical Exam@[facility]  (ICD-V70.0) 13)  Obesity, Morbid  (ICD-278.01) 14)  Hemorrhoids  (ICD-455.6) 15)  Hypertension  (ICD-401.9)  Medications Prior to Update: 1)  Lisinopril 20 Mg  Tabs (Lisinopril) .... Take 1 By Mouth Once Daily 2)  Crestor 20 Mg  Tabs (Rosuvastatin Calcium) .Marland Kitchen.. 1 By Mouth Once Daily 3)  Ecotrin Low Strength 81 Mg  Tbec (Aspirin) .Marland Kitchen.. 1 By Mouth Qd 4)  Ibuprofen 800 Mg Tabs (Ibuprofen) .Marland Kitchen.. 1 By Mouth Q 6 Hr As Needed 5)  Tramadol Hcl 50 Mg Tabs (Tramadol Hcl) .Marland Kitchen.. 1 By Mouth Q 6 Hrs As Needed Pain 6)  Azithromycin 250 Mg Tabs (Azithromycin) .... 2po Qd For 1 Day, Then 1po Qd For 4days, Then Stop 7)  Hydrocodone-Homatropine 5-1.5 Mg/57ml Syrp (Hydrocodone-Homatropine) .Marland Kitchen.. 1 Tsp By Mouth Q 6 Hrs As Needed  Current Medications (verified): 1)  Lisinopril 20 Mg  Tabs (Lisinopril) .... Take 1 By Mouth Once Daily 2)  Crestor 20 Mg  Tabs (Rosuvastatin Calcium) .Marland Kitchen.. 1 By Mouth Once Daily 3)  Ecotrin Low Strength 81 Mg  Tbec (Aspirin) .Marland Kitchen.. 1 By Mouth Qd 4)  Ibuprofen 800 Mg Tabs (Ibuprofen) .Marland Kitchen.. 1 By Mouth Q 6 Hr As Needed  Allergies (verified): No Known Drug Allergies  Past History:  Past Medical History: Last updated: 09/06/2007 Hypertension Hyperlipidemia Allergic rhinitis  Past Surgical History: Last updated: 09/06/2007 Denies surgical  history  Family History: Last updated: Sep 28, 2007 father died with MI at 95 yo brother died with MI at 27yo  Social History: Last updated: 08/31/2010 Never Smoked Alcohol use-no Married Drug use-no  Risk Factors: Smoking Status: never (September 28, 2007)  Review of Systems  The patient denies anorexia, fever, vision loss, decreased hearing, hoarseness, chest pain, syncope, dyspnea on exertion, peripheral edema, prolonged cough, headaches, hemoptysis, abdominal pain, melena, hematochezia, severe indigestion/heartburn, hematuria, muscle weakness, suspicious skin lesions, transient  blindness, difficulty walking, depression, unusual weight change, abnormal bleeding, enlarged lymph nodes, and angioedema.         all otherwise negative per pt -    Physical Exam  General:  alert and overweight-appearing. Head:  normocephalic and atraumatic.   Eyes:  vision grossly intact, pupils equal, and pupils round.   Ears:  R ear normal and L ear normal.   Nose:  no external deformity and no nasal discharge.   Mouth:  no gingival abnormalities and pharynx pink and moist.   Neck:  supple and no masses.   Lungs:  normal respiratory effort and normal breath sounds.   Heart:  normal rate and regular rhythm.   Abdomen:  soft, non-tender, and normal bowel sounds.   Msk:  no joint tenderness and no joint swelling.   Extremities:  no edema, no erythema  Neurologic:  cranial nerves II-XII intact, strength normal in all extremities, and gait normal.   Skin:  color normal and no rashes.   Psych:  not depressed appearing and slightly anxious.     Impression & Recommendations:  Problem # 1:  Preventive Health Care (ICD-V70.0) Overall doing well, age appropriate education and counseling updated, referral for preventive services and immunizations addressed, dietary counseling and smoking status adressed , most recent labs reviewed, ecg reviewed I have personally reviewed and have noted 1.The patient's medical and social history 2.Their use of alcohol, tobacco or illicit drugs 3.Their current medications and supplements 4. Functional ability including ADL's, fall risk, home safety risk, hearing & visual impairment  5.Diet and physical activities 6.Evidence for depression or mood disorders The patients weight, height, BMI  have been recorded in the chart I have made referrals, counseling and provided education to the patient based review of the above  Orders: EKG w/ Interpretation (93000)  Problem # 2:  ALLERGIC RHINITIS (ICD-477.9) with cough - for otc allegra as needed   Complete  Medication List: 1)  Lisinopril 20 Mg Tabs (Lisinopril) .... Take 1 by mouth once daily 2)  Crestor 20 Mg Tabs (Rosuvastatin calcium) .Marland Kitchen.. 1 by mouth once daily 3)  Ecotrin Low Strength 81 Mg Tbec (Aspirin) .Marland Kitchen.. 1 by mouth qd 4)  Ibuprofen 800 Mg Tabs (Ibuprofen) .Marland Kitchen.. 1 by mouth q 6 hr as needed  Other Orders: Flu Vaccine 31yrs + (54270) Admin 1st Vaccine (62376)  Patient Instructions: 1)  you had the flu shot today 2)  Continue all previous medications as before this visit  3)  Please schedule a follow-up appointment in 1 year, or sooner if needed   Orders Added: 1)  EKG w/ Interpretation [93000] 2)  Flu Vaccine 63yrs + [90658] 3)  Admin 1st Vaccine [90471] 4)  Est. Patient 40-64 years [99396]   Immunizations Administered:  Influenza Vaccine # 1:    Vaccine Type: Fluvax 3+    Site: left deltoid    Mfr: Sanofi Pasteur    Dose: 0.5 ml    Route: IM    Given by: Zella Ball Ewing CMA (AAMA)  Exp. Date: 04/03/2011    Lot #: [id number]    VIS given: 04/28/10 version given October 06, 2010.  Flu Vaccine Consent Questions:    Do you have a history of severe allergic reactions to this vaccine? no    Any prior history of allergic reactions to egg and/or gelatin? no    Do you have a sensitivity to the preservative Thimersol? no    Do you have a past history of Guillan-Barre Syndrome? no    Do you currently have an acute febrile illness? no    Have you ever had a severe reaction to latex? no    Vaccine information given and explained to patient? yes   Immunizations Administered:  Influenza Vaccine # 1:    Vaccine Type: Fluvax 3+    Site: left deltoid    Mfr: Sanofi Pasteur    Dose: 0.5 ml    Route: IM    Given by: Zella Ball Ewing CMA (AAMA)    Exp. Date: 04/03/2011    Lot #: [id number]    VIS given: 04/28/10 version given October 06, 2010.

## 2011-07-05 LAB — DIFFERENTIAL
Basophils Absolute: 0
Basophils Relative: 0
Monocytes Absolute: 0.7
Neutro Abs: 5.7
Neutrophils Relative %: 60

## 2011-07-05 LAB — POCT I-STAT, CHEM 8
BUN: 11
Calcium, Ion: 1.18
Glucose, Bld: 112 — ABNORMAL HIGH
TCO2: 26

## 2011-07-05 LAB — POCT CARDIAC MARKERS
CKMB, poc: 1.1
Troponin i, poc: <0.05

## 2011-07-05 LAB — CBC
MCHC: 33.9
RDW: 13

## 2011-08-02 ENCOUNTER — Other Ambulatory Visit: Payer: Self-pay | Admitting: Internal Medicine

## 2011-09-15 ENCOUNTER — Ambulatory Visit (INDEPENDENT_AMBULATORY_CARE_PROVIDER_SITE_OTHER): Payer: PRIVATE HEALTH INSURANCE | Admitting: Internal Medicine

## 2011-09-15 ENCOUNTER — Encounter: Payer: Self-pay | Admitting: Internal Medicine

## 2011-09-15 VITALS — BP 102/60 | HR 89 | Temp 98.8°F | Ht 71.5 in | Wt 332.1 lb

## 2011-09-15 DIAGNOSIS — I10 Essential (primary) hypertension: Secondary | ICD-10-CM

## 2011-09-15 DIAGNOSIS — J309 Allergic rhinitis, unspecified: Secondary | ICD-10-CM

## 2011-09-15 DIAGNOSIS — Z0000 Encounter for general adult medical examination without abnormal findings: Secondary | ICD-10-CM

## 2011-09-15 DIAGNOSIS — J209 Acute bronchitis, unspecified: Secondary | ICD-10-CM

## 2011-09-15 DIAGNOSIS — Z0001 Encounter for general adult medical examination with abnormal findings: Secondary | ICD-10-CM | POA: Insufficient documentation

## 2011-09-15 MED ORDER — HYDROCODONE-HOMATROPINE 5-1.5 MG/5ML PO SYRP: 5.0000 mL | ORAL_SOLUTION | Freq: Four times a day (QID) | ORAL | Status: DC | PRN

## 2011-09-15 MED ORDER — ROSUVASTATIN CALCIUM 20 MG PO TABS: 20.0000 mg | ORAL_TABLET | Freq: Every day | ORAL | Status: DC

## 2011-09-15 MED ORDER — LEVOFLOXACIN 250 MG PO TABS: 250.0000 mg | ORAL_TABLET | Freq: Every day | ORAL | Status: AC

## 2011-09-15 MED ORDER — LISINOPRIL 20 MG PO TABS: 20.0000 mg | ORAL_TABLET | Freq: Every day | ORAL | Status: DC

## 2011-09-15 NOTE — Patient Instructions (Signed)
Take all new medications as prescribed Continue all other medications as before Please go to LAB in the Basement for the blood and/or urine tests to be done today Please call the phone number [phone] (the [employer] System) for results of testing in 2-3 days;  When calling, simply dial the number, and when prompted enter the MRN number above (the Medical Record Number) and the # key, then the message should start. Please return in 1 year for your yearly visit, or sooner if needed, with Lab testing done 3-5 days before  

## 2011-09-19 ENCOUNTER — Encounter: Payer: Self-pay | Admitting: Internal Medicine

## 2011-09-19 NOTE — Assessment & Plan Note (Signed)
Mild to mod, for antibx course,  to f/u any worsening symptoms or concerns 

## 2011-09-19 NOTE — Assessment & Plan Note (Signed)
Mild to mod, for allegra otc course,  to f/u any worsening symptoms or concerns

## 2011-09-19 NOTE — Assessment & Plan Note (Signed)
stable overall by hx and exam, most recent data reviewed with pt, and pt to continue medical treatment as before BP Readings from Last 3 Encounters:  09/15/11 102/60  10/06/10 118/60  08/31/10 112/72

## 2011-09-19 NOTE — Progress Notes (Signed)
Subjective:    Patient ID: Martin Lozano, male    DOB: [birth year], 50 y.o.   MRN: [id number]  HPI   Here for wellness and f/u;  Overall doing ok;  Pt denies CP, worsening SOB, DOE, wheezing, orthopnea, PND, worsening LE edema, palpitations, dizziness or syncope.  Pt denies neurological change such as new Headache, facial or extremity weakness.  Pt denies polydipsia, polyuria, or low sugar symptoms. Pt states overall good compliance with treatment and medications, good tolerability, and trying to follow lower cholesterol diet.  Pt denies worsening depressive symptoms, suicidal ideation or panic. No fever, wt loss, night sweats, loss of appetite, or other constitutional symptoms.  Pt states good ability with ADL's, low fall risk, home safety reviewed and adequate, no significant changes in hearing or vision, and occasionally active with exercise.  Also  Here with acute onset mild to mod 2-3 days ST, HA, general weakness and malaise, with prod cough greenish sputum,  Does have several wks ongoing nasal allergy symptoms with clear congestion, itch and sneeze, without fever, pain, ST, cough or wheezing. Past Medical History  Diagnosis Date  . ALLERGIC RHINITIS 09/06/2007  . ASTHMATIC BRONCHITIS, ACUTE 11/16/2007  . HEMORRHOIDS 07/03/2007  . HYPERLIPIDEMIA 09/06/2007  . HYPERTENSION 07/03/2007  . KNEE SPRAIN, LEFT 07/30/2008  . OBESITY, MORBID 07/03/2007  . Other tenosynovitis of hand and wrist 06/11/2010  . OTITIS MEDIA, ACUTE, LEFT 11/10/2009  . Wheezing 09/09/2009   No past surgical history on file.  reports that he has never smoked. He does not have any smokeless tobacco history on file. He reports that he does not drink alcohol or use illicit drugs. family history is not on file. No Known Allergies Current Outpatient Prescriptions on File Prior to Visit  Medication Sig Dispense Refill  . aspirin 81 MG tablet Take 81 mg by mouth daily.        Marland Kitchen ibuprofen (ADVIL,MOTRIN) 800 MG tablet Take 800 mg by  mouth every 6 (six) hours as needed.         Review of Systems Review of Systems  Constitutional: Negative for diaphoresis, activity change, appetite change and unexpected weight change.  HENT: Negative for hearing loss, ear pain, facial swelling, mouth sores and neck stiffness.   Eyes: Negative for pain, redness and visual disturbance.  Respiratory: Negative for shortness of breath and wheezing.   Cardiovascular: Negative for chest pain and palpitations.  Gastrointestinal: Negative for diarrhea, blood in stool, abdominal distention and rectal pain.  Genitourinary: Negative for hematuria, flank pain and decreased urine volume.  Musculoskeletal: Negative for myalgias and joint swelling.  Skin: Negative for color change and wound.  Neurological: Negative for syncope and numbness.  Hematological: Negative for adenopathy.  Psychiatric/Behavioral: Negative for hallucinations, self-injury, decreased concentration and agitation.      Objective:   Physical Exam BP 102/60  Pulse 89  Temp(Src) 98.8 F (37.1 C) (Oral)  Ht 5' 11.5" (1.816 m)  Wt 332 lb 2 oz (150.651 kg)  BMI 45.68 kg/m2  SpO2 98% Physical Exam  VS noted, mild ill Constitutional: Pt appears well-developed and well-nourished.  HENT: Head: Normocephalic.  Right Ear: External ear normal.  Left Ear: External ear normal.  Bilat tm's mild erythema.  Sinus nontender.  Pharynx mild erythema Eyes: Conjunctivae and EOM are normal. Pupils are equal, round, and reactive to light.  Neck: Normal range of motion. Neck supple.  Cardiovascular: Normal rate and regular rhythm.   Pulmonary/Chest: Effort normal and breath sounds normal.  Neurological: Pt is  alert. No cranial nerve deficit.  Skin: Skin is warm. No erythema.  Psychiatric: Pt behavior is normal. Thought content normal.     Assessment & Plan:

## 2011-09-22 ENCOUNTER — Other Ambulatory Visit: Payer: Self-pay | Admitting: Internal Medicine

## 2011-09-23 ENCOUNTER — Other Ambulatory Visit: Payer: Self-pay | Admitting: Internal Medicine

## 2011-09-23 NOTE — Telephone Encounter (Signed)
Faxed hardcopy to pharmacy. 

## 2011-09-23 NOTE — Telephone Encounter (Signed)
Done hardcopy to robin  

## 2011-10-07 ENCOUNTER — Other Ambulatory Visit: Payer: Self-pay | Admitting: Internal Medicine

## 2011-10-17 ENCOUNTER — Other Ambulatory Visit: Payer: Self-pay | Admitting: Internal Medicine

## 2011-10-18 ENCOUNTER — Other Ambulatory Visit: Payer: Self-pay | Admitting: Internal Medicine

## 2011-11-01 ENCOUNTER — Other Ambulatory Visit: Payer: Self-pay | Admitting: Internal Medicine

## 2011-11-04 ENCOUNTER — Other Ambulatory Visit (INDEPENDENT_AMBULATORY_CARE_PROVIDER_SITE_OTHER): Payer: PRIVATE HEALTH INSURANCE

## 2011-11-04 DIAGNOSIS — Z0000 Encounter for general adult medical examination without abnormal findings: Secondary | ICD-10-CM

## 2011-11-04 LAB — BASIC METABOLIC PANEL
CO2: 28 mEq/L (ref 19–32)
Chloride: 103 mEq/L (ref 96–112)
Creatinine, Ser: 1 mg/dL (ref 0.4–1.5)
Potassium: 3.7 mEq/L (ref 3.5–5.1)

## 2011-11-04 LAB — CBC WITH DIFFERENTIAL/PLATELET
Basophils Absolute: 0.1 10*3/uL (ref 0.0–0.1)
Eosinophils Absolute: 0.3 10*3/uL (ref 0.0–0.7)
Lymphocytes Relative: 26.9 % (ref 12.0–46.0)
MCHC: 34.5 g/dL (ref 30.0–36.0)
MCV: 87.8 fl (ref 78.0–100.0)
Monocytes Absolute: 0.4 10*3/uL (ref 0.1–1.0)
Neutrophils Relative %: 64.3 % (ref 43.0–77.0)
Platelets: 262 10*3/uL (ref 150.0–400.0)
RBC: 4.66 Mil/uL (ref 4.22–5.81)
RDW: 13 % (ref 11.5–14.6)

## 2011-11-04 LAB — URINALYSIS, ROUTINE W REFLEX MICROSCOPIC
Bilirubin Urine: NEGATIVE
Nitrite: NEGATIVE
Urobilinogen, UA: 0.2 (ref 0.0–1.0)

## 2011-11-04 LAB — TSH: TSH: 0.94 u[IU]/mL (ref 0.35–5.50)

## 2011-11-04 LAB — HEPATIC FUNCTION PANEL
ALT: 21 U/L (ref 0–53)
AST: 29 U/L (ref 0–37)
Alkaline Phosphatase: 57 U/L (ref 39–117)
Bilirubin, Direct: 0.2 mg/dL (ref 0.0–0.3)
Total Protein: 6.9 g/dL (ref 6.0–8.3)

## 2011-11-04 LAB — LIPID PANEL
Cholesterol: 144 mg/dL (ref 0–200)
Total CHOL/HDL Ratio: 4

## 2011-11-05 LAB — HEMOGLOBIN A1C: Hgb A1c MFr Bld: 6.1 % (ref 4.6–6.5)

## 2011-11-05 LAB — LDL CHOLESTEROL, DIRECT: Direct LDL: 86.5 mg/dL

## 2011-11-09 ENCOUNTER — Other Ambulatory Visit: Payer: Self-pay | Admitting: Internal Medicine

## 2012-01-04 ENCOUNTER — Ambulatory Visit (INDEPENDENT_AMBULATORY_CARE_PROVIDER_SITE_OTHER): Payer: PRIVATE HEALTH INSURANCE | Admitting: Internal Medicine

## 2012-01-04 ENCOUNTER — Encounter: Payer: Self-pay | Admitting: Internal Medicine

## 2012-01-04 VITALS — BP 104/62 | HR 82 | Temp 97.9°F | Ht 71.0 in | Wt 334.5 lb

## 2012-01-04 DIAGNOSIS — J309 Allergic rhinitis, unspecified: Secondary | ICD-10-CM

## 2012-01-04 DIAGNOSIS — J209 Acute bronchitis, unspecified: Secondary | ICD-10-CM | POA: Insufficient documentation

## 2012-01-04 DIAGNOSIS — I10 Essential (primary) hypertension: Secondary | ICD-10-CM

## 2012-01-04 MED ORDER — HYDROCODONE-HOMATROPINE 5-1.5 MG/5ML PO SYRP: 5.0000 mL | ORAL_SOLUTION | Freq: Four times a day (QID) | ORAL | Status: AC | PRN

## 2012-01-04 MED ORDER — AZITHROMYCIN 250 MG PO TABS: ORAL_TABLET | ORAL | Status: AC

## 2012-01-04 NOTE — Progress Notes (Signed)
  Subjective:    Patient ID: Martin Lozano, male    DOB: 10-03-1961, 51 y.o.   MRN: [id number]  HPI  Here with acute onset mild to mod 2-3 days ST, HA, general weakness and malaise, with prod cough greenish sputum, but Pt denies chest pain, increased sob or doe, wheezing, orthopnea, PND, increased LE swelling, palpitations, dizziness or syncope.  Pt denies new neurological symptoms such as new headache, or facial or extremity weakness or numbness   Pt denies polydipsia, polyuria, .  Pt states overall good compliance with meds, trying to follow lower cholesterol diet, wt overall stable but hard to lose.  Does have 1-2 wks returning nasal allergy symptoms with clear congestion, itch and sneeze, without fever, pain, ST, cough or wheezing.  Past Medical History  Diagnosis Date  . ALLERGIC RHINITIS 09/06/2007  . ASTHMATIC BRONCHITIS, ACUTE 11/16/2007  . HEMORRHOIDS 07/03/2007  . HYPERLIPIDEMIA 09/06/2007  . HYPERTENSION 07/03/2007  . KNEE SPRAIN, LEFT 07/30/2008  . OBESITY, MORBID 07/03/2007  . Other tenosynovitis of hand and wrist 06/11/2010  . OTITIS MEDIA, ACUTE, LEFT 11/10/2009  . Wheezing 09/09/2009   No past surgical history on file.  reports that he has never smoked. He does not have any smokeless tobacco history on file. He reports that he does not drink alcohol or use illicit drugs. family history is not on file. No Known Allergies Current Outpatient Prescriptions on File Prior to Visit  Medication Sig Dispense Refill  . aspirin 81 MG tablet Take 81 mg by mouth daily.        Marland Kitchen lisinopril (PRINIVIL,ZESTRIL) 20 MG tablet TAKE ONE TABLET BY MOUTH EVERY DAY  90 tablet  3  . rosuvastatin (CRESTOR) 20 MG tablet Take 1 tablet (20 mg total) by mouth daily.  90 tablet  3  . ibuprofen (ADVIL,MOTRIN) 800 MG tablet Take 800 mg by mouth every 6 (six) hours as needed.         Review of Systems Review of Systems  Constitutional: Negative for diaphoresis and unexpected weight change.  HENT: Negative for  drooling and tinnitus.   Eyes: Negative for photophobia and visual disturbance.  Respiratory: Negative for choking and stridor.   Gastrointestinal: Negative for vomiting and blood in stool.  Genitourinary: Negative for hematuria and decreased urine volume.    Objective:   Physical Exam BP 104/62  Pulse 82  Temp(Src) 97.9 F (36.6 C) (Oral)  Ht 5\' 11"  (1.803 m)  Wt 334 lb 8 oz (151.728 kg)  BMI 46.65 kg/m2  SpO2 97% Physical Exam  VS noted, mild ill Constitutional: Pt appears well-developed and well-nourished.  HENT: Head: Normocephalic.  Right Ear: External ear normal.  Left Ear: External ear normal.  Bilat tm's mild erythema.  Sinus nontender.  Pharynx mild erythema Eyes: Conjunctivae and EOM are normal. Pupils are equal, round, and reactive to light.  Neck: Normal range of motion. Neck supple.  Cardiovascular: Normal rate and regular rhythm.   Pulmonary/Chest: Effort normal and breath sounds normal.  Skin: Skin is warm. No erythema. No rash Psychiatric: Pt behavior is normal. Thought content normal.     Assessment & Plan:

## 2012-01-04 NOTE — Assessment & Plan Note (Signed)
Mild to mod, for otc allegra prn, to f/u any worsening symptoms or concerns 

## 2012-01-04 NOTE — Assessment & Plan Note (Signed)
stable overall by hx and exam, most recent data reviewed with pt, and pt to continue medical treatment as before  BP Readings from Last 3 Encounters:  01/04/12 104/62  09/15/11 102/60  10/06/10 118/60

## 2012-01-04 NOTE — Patient Instructions (Signed)
Take all new medications as prescribed Continue all other medications as before Please have the pharmacy call with any refills you may need.  

## 2012-01-04 NOTE — Assessment & Plan Note (Signed)
Mild to mod, for antibx course,  to f/u any worsening symptoms or concerns 

## 2012-01-26 ENCOUNTER — Other Ambulatory Visit: Payer: Self-pay

## 2012-01-26 MED ORDER — ROSUVASTATIN CALCIUM 20 MG PO TABS: 20.0000 mg | ORAL_TABLET | Freq: Every day | ORAL | Status: DC

## 2012-04-30 ENCOUNTER — Other Ambulatory Visit: Payer: Self-pay | Admitting: Internal Medicine

## 2012-06-20 ENCOUNTER — Other Ambulatory Visit: Payer: Self-pay

## 2012-06-20 MED ORDER — IBUPROFEN 800 MG PO TABS: 800.0000 mg | ORAL_TABLET | Freq: Four times a day (QID) | ORAL | Status: DC | PRN

## 2012-06-28 ENCOUNTER — Encounter: Payer: Self-pay | Admitting: Internal Medicine

## 2012-06-28 ENCOUNTER — Ambulatory Visit (INDEPENDENT_AMBULATORY_CARE_PROVIDER_SITE_OTHER): Payer: PRIVATE HEALTH INSURANCE | Admitting: Internal Medicine

## 2012-06-28 VITALS — BP 100/64 | HR 84 | Temp 98.8°F | Ht 71.5 in | Wt 340.1 lb

## 2012-06-28 DIAGNOSIS — I10 Essential (primary) hypertension: Secondary | ICD-10-CM

## 2012-06-28 DIAGNOSIS — E785 Hyperlipidemia, unspecified: Secondary | ICD-10-CM

## 2012-06-28 DIAGNOSIS — M25569 Pain in unspecified knee: Secondary | ICD-10-CM

## 2012-06-28 DIAGNOSIS — M25561 Pain in right knee: Secondary | ICD-10-CM

## 2012-06-28 MED ORDER — PREDNISONE 10 MG PO TABS: 10.0000 mg | ORAL_TABLET | Freq: Every day | ORAL | Status: DC

## 2012-06-28 MED ORDER — TRAMADOL HCL 50 MG PO TABS: 50.0000 mg | ORAL_TABLET | Freq: Four times a day (QID) | ORAL | Status: DC | PRN

## 2012-06-28 NOTE — Patient Instructions (Addendum)
Take all new medications as prescribed Continue all other medications as before Please call in 1-2 wks for orthopedic referral if not improved

## 2012-07-02 ENCOUNTER — Encounter: Payer: Self-pay | Admitting: Internal Medicine

## 2012-07-02 DIAGNOSIS — M25561 Pain in right knee: Secondary | ICD-10-CM | POA: Insufficient documentation

## 2012-07-02 NOTE — Progress Notes (Signed)
  Subjective:    Patient ID: Martin Lozano, male    DOB: 01-25-1961, 51 y.o.   MRN: [id number]  HPI  Here with c/o acute onset 2 wks right knee sharp pain, mild to mod, worse to walk and stand now for the past 3 days, ibuprofen not helping, not sure how started and denies any torque or twisting, no giveaways, trauma or fall, but has ? Mild swelling, worst to the first few steps especially, no fever.  Pt denies chest pain, increased sob or doe, wheezing, orthopnea, PND, increased LE swelling, palpitations, dizziness or syncope.   Pt denies polydipsia, polyuria.  Pt denies polydipsia, polyuria.   Pt denies fever, wt loss, night sweats, loss of appetite, or other constitutional symptoms.  Also trying to follow lower chol diet. Past Medical History  Diagnosis Date  . ALLERGIC RHINITIS 09/06/2007  . ASTHMATIC BRONCHITIS, ACUTE 11/16/2007  . HEMORRHOIDS 07/03/2007  . HYPERLIPIDEMIA 09/06/2007  . HYPERTENSION 07/03/2007  . KNEE SPRAIN, LEFT 07/30/2008  . OBESITY, MORBID 07/03/2007  . Other tenosynovitis of hand and wrist 06/11/2010  . OTITIS MEDIA, ACUTE, LEFT 11/10/2009  . Wheezing 09/09/2009   No past surgical history on file.  reports that he has never smoked. He does not have any smokeless tobacco history on file. He reports that he does not drink alcohol or use illicit drugs. family history is not on file. No Known Allergies Current Outpatient Prescriptions on File Prior to Visit  Medication Sig Dispense Refill  . aspirin 81 MG tablet Take 81 mg by mouth daily.        Marland Kitchen ibuprofen (ADVIL,MOTRIN) 800 MG tablet Take 1 tablet (800 mg total) by mouth every 6 (six) hours as needed.  30 tablet  2  . lisinopril (PRINIVIL,ZESTRIL) 20 MG tablet TAKE ONE TABLET BY MOUTH EVERY DAY  90 tablet  3  . rosuvastatin (CRESTOR) 20 MG tablet Take 1 tablet (20 mg total) by mouth daily.  90 tablet  3   Review of Systems  Constitutional: Negative for diaphoresis and unexpected weight change.  HENT: Negative for tinnitus.    Eyes: Negative for photophobia and visual disturbance.  Respiratory: Negative for choking and stridor.   Gastrointestinal: Negative for vomiting and blood in stool.  Genitourinary: Negative for hematuria and decreased urine volume.  Musculoskeletal: Negative for gait problem.  Skin: Negative for color change and wound.  Neurological: Negative for tremors and numbness.  Psychiatric/Behavioral: Negative for decreased concentration. The patient is not hyperactive.       Objective:   Physical Exam BP 100/64  Pulse 84  Temp 98.8 F (37.1 C) (Oral)  Ht 5' 11.5" (1.816 m)  Wt 340 lb 2 oz (154.28 kg)  BMI 46.78 kg/m2  SpO2 96% Physical Exam  VS noted, not ill appearing Constitutional: Pt appears well-developed and well-nourished.  HENT: Head: Normocephalic.  Right Ear: External ear normal.  Left Ear: External ear normal.  Eyes: Conjunctivae and EOM are normal. Pupils are equal, round, and reactive to light.  Neck: Normal range of motion. Neck supple.  Cardiovascular: Normal rate and regular rhythm.   Pulmonary/Chest: Effort normal and breath sounds normal.  Abd:  Soft, NT, non-distended, + BS Neurological: Pt is alert. Not confused  Right knee:  Trace effusion, NT except for tender paterllar tendon, some decreased ROM with small crepitus, ? anterolat click, lochman's neg Psychiatric: Pt behavior is normal. Thought content normal.     Assessment & Plan:

## 2012-07-02 NOTE — Assessment & Plan Note (Signed)
stable overall by hx and exam, most recent data reviewed with pt, and pt to continue medical treatment as before BP Readings from Last 3 Encounters:  06/28/12 100/64  01/04/12 104/62  09/15/11 102/60

## 2012-07-02 NOTE — Assessment & Plan Note (Signed)
stable overall by hx and exam, most recent data reviewed with pt, and pt to continue medical treatment as before Lab Results  Component Value Date   LDLCALC 89 10/02/2010   Also d/w pt, to cont med/diet,  to f/u any worsening symptoms or concerns

## 2012-07-02 NOTE — Assessment & Plan Note (Signed)
?   Ligamentous injury or cartilage damage vs tendonitis, for ultram and predpack asd,  to f/u any worsening symptoms or concerns

## 2012-09-21 ENCOUNTER — Other Ambulatory Visit (INDEPENDENT_AMBULATORY_CARE_PROVIDER_SITE_OTHER): Payer: PRIVATE HEALTH INSURANCE

## 2012-09-21 DIAGNOSIS — Z0000 Encounter for general adult medical examination without abnormal findings: Secondary | ICD-10-CM

## 2012-09-21 LAB — URINALYSIS, ROUTINE W REFLEX MICROSCOPIC
Nitrite: NEGATIVE
Specific Gravity, Urine: >=1.03 (ref 1.000–1.030)
Total Protein, Urine: NEGATIVE
Urine Glucose: 100
pH: 6 (ref 5.0–8.0)

## 2012-09-21 LAB — TSH: TSH: 1.11 u[IU]/mL (ref 0.35–5.50)

## 2012-09-21 LAB — LIPID PANEL
Cholesterol: 134 mg/dL (ref 0–200)
Triglycerides: 164 mg/dL — ABNORMAL HIGH (ref 0.0–149.0)
VLDL: 32.8 mg/dL (ref 0.0–40.0)

## 2012-09-21 LAB — BASIC METABOLIC PANEL
BUN: 14 mg/dL (ref 6–23)
Creatinine, Ser: 0.9 mg/dL (ref 0.4–1.5)
GFR: 92.53 mL/min (ref >60.00)
Potassium: 4.1 mEq/L (ref 3.5–5.1)

## 2012-09-21 LAB — CBC WITH DIFFERENTIAL/PLATELET
Basophils Absolute: 0 10*3/uL (ref 0.0–0.1)
Eosinophils Relative: 3 % (ref 0.0–5.0)
Lymphs Abs: 2.7 10*3/uL (ref 0.7–4.0)
Monocytes Relative: 6.1 % (ref 3.0–12.0)
Neutrophils Relative %: 61.9 % (ref 43.0–77.0)
Platelets: 233 10*3/uL (ref 150.0–400.0)
RDW: 13.2 % (ref 11.5–14.6)
WBC: 9.4 10*3/uL (ref 4.5–10.5)

## 2012-09-21 LAB — HEPATIC FUNCTION PANEL
ALT: 27 U/L (ref 0–53)
Albumin: 3.8 g/dL (ref 3.5–5.2)
Bilirubin, Direct: 0.1 mg/dL (ref 0.0–0.3)
Total Protein: 6.9 g/dL (ref 6.0–8.3)

## 2012-09-22 ENCOUNTER — Telehealth: Payer: Self-pay

## 2012-09-22 ENCOUNTER — Other Ambulatory Visit (INDEPENDENT_AMBULATORY_CARE_PROVIDER_SITE_OTHER): Payer: PRIVATE HEALTH INSURANCE

## 2012-09-22 DIAGNOSIS — R7309 Other abnormal glucose: Secondary | ICD-10-CM

## 2012-09-22 NOTE — Telephone Encounter (Signed)
addon to the lab

## 2012-09-25 ENCOUNTER — Ambulatory Visit (INDEPENDENT_AMBULATORY_CARE_PROVIDER_SITE_OTHER): Payer: PRIVATE HEALTH INSURANCE | Admitting: Internal Medicine

## 2012-09-25 ENCOUNTER — Encounter: Payer: Self-pay | Admitting: Internal Medicine

## 2012-09-25 VITALS — BP 120/68 | HR 80 | Temp 98.1°F | Ht 71.5 in | Wt 341.1 lb

## 2012-09-25 DIAGNOSIS — IMO0001 Reserved for inherently not codable concepts without codable children: Secondary | ICD-10-CM

## 2012-09-25 DIAGNOSIS — E119 Type 2 diabetes mellitus without complications: Secondary | ICD-10-CM | POA: Insufficient documentation

## 2012-09-25 DIAGNOSIS — Z0000 Encounter for general adult medical examination without abnormal findings: Secondary | ICD-10-CM

## 2012-09-25 HISTORY — DX: Reserved for inherently not codable concepts without codable children: IMO0001

## 2012-09-25 MED ORDER — ROSUVASTATIN CALCIUM 20 MG PO TABS: 20.0000 mg | ORAL_TABLET | Freq: Every day | ORAL | Status: DC

## 2012-09-25 MED ORDER — LISINOPRIL 20 MG PO TABS: 20.0000 mg | ORAL_TABLET | Freq: Every day | ORAL | Status: DC

## 2012-09-25 MED ORDER — TRAMADOL HCL 50 MG PO TABS: 50.0000 mg | ORAL_TABLET | Freq: Four times a day (QID) | ORAL | Status: DC | PRN

## 2012-09-25 NOTE — Patient Instructions (Addendum)
Please call or return if you change your mind about the flu shot and colonoscopy Continue all other medications as before Your refills were done as requested today You will be contacted regarding the referral for: Diabetes Education Thank you for enrolling in MyChart. Please follow the instructions below to securely access your online medical record. MyChart allows you to send messages to your doctor, view your test results, renew your prescriptions, schedule appointments, and more. To Log into MyChart, please go to [email or web address], and your Username is: [email or web address] Please return in 6 mo with Lab testing done 3-5 days before

## 2012-09-25 NOTE — Progress Notes (Signed)
Subjective:    Patient ID: Martin Lozano, male    DOB: 1961-08-15, 51 y.o.   MRN: [id number]  HPI  Here for wellness and f/u;  Overall doing ok;  Pt denies CP, worsening SOB, DOE, wheezing, orthopnea, PND, worsening LE edema, palpitations, dizziness or syncope.  Pt denies neurological change such as new Headache, facial or extremity weakness.  Pt denies polydipsia, polyuria, or low sugar symptoms. Pt states overall good compliance with treatment and medications, good tolerability, and trying to follow lower cholesterol diet.  Pt denies worsening depressive symptoms, suicidal ideation or panic. No fever, wt loss, night sweats, loss of appetite, or other constitutional symptoms.  Pt states good ability with ADL's, low fall risk, home safety reviewed and adequate, no significant changes in hearing or vision, and rarely active with exercise.   No acute complaints Past Medical History  Diagnosis Date  . ALLERGIC RHINITIS 09/06/2007  . ASTHMATIC BRONCHITIS, ACUTE 11/16/2007  . HEMORRHOIDS 07/03/2007  . HYPERLIPIDEMIA 09/06/2007  . HYPERTENSION 07/03/2007  . KNEE SPRAIN, LEFT 07/30/2008  . OBESITY, MORBID 07/03/2007  . Other tenosynovitis of hand and wrist 06/11/2010  . OTITIS MEDIA, ACUTE, LEFT 11/10/2009  . Wheezing 09/09/2009   No past surgical history on file.  reports that he has never smoked. He does not have any smokeless tobacco history on file. He reports that he does not drink alcohol or use illicit drugs. family history is not on file. No Known Allergies Current Outpatient Prescriptions on File Prior to Visit  Medication Sig Dispense Refill  . aspirin 81 MG tablet Take 81 mg by mouth daily.        Marland Kitchen lisinopril (PRINIVIL,ZESTRIL) 20 MG tablet TAKE ONE TABLET BY MOUTH EVERY DAY  90 tablet  3  . rosuvastatin (CRESTOR) 20 MG tablet Take 1 tablet (20 mg total) by mouth daily.  90 tablet  3  . ibuprofen (ADVIL,MOTRIN) 800 MG tablet Take 1 tablet (800 mg total) by mouth every 6 (six) hours as needed.   30 tablet  2  . predniSONE (DELTASONE) 10 MG tablet Take 1 tablet (10 mg total) by mouth daily. 3 tabs by mouth per day for 3 days, then 2 tabs per day for 3 days, then 1 tab per day for 3 days, then stop  18 tablet  0  . traMADol (ULTRAM) 50 MG tablet Take 1 tablet (50 mg total) by mouth every 6 (six) hours as needed for pain.  60 tablet  1   Review of Systems Review of Systems  Constitutional: Negative for diaphoresis, activity change, appetite change and unexpected weight change.  HENT: Negative for hearing loss, ear pain, facial swelling, mouth sores and neck stiffness.   Eyes: Negative for pain, redness and visual disturbance.  Respiratory: Negative for shortness of breath and wheezing.   Cardiovascular: Negative for chest pain and palpitations.  Gastrointestinal: Negative for diarrhea, blood in stool, abdominal distention and rectal pain.  Genitourinary: Negative for hematuria, flank pain and decreased urine volume.  Musculoskeletal: Negative for myalgias and joint swelling.  Skin: Negative for color change and wound.  Neurological: Negative for syncope and numbness.  Hematological: Negative for adenopathy.  Psychiatric/Behavioral: Negative for hallucinations, self-injury, decreased concentration and agitation.      Objective:   Physical Exam BP 120/68  Pulse 80  Temp 98.1 F (36.7 C) (Oral)  Ht 5' 11.5" (1.816 m)  Wt 341 lb 2 oz (154.733 kg)  BMI 46.91 kg/m2  SpO2 96% Physical Exam  VS noted Constitutional:  Pt is oriented to person, place, and time. Appears well-developed and well-nourished. /morbid obese HENT:  Head: Normocephalic and atraumatic.  Right Ear: External ear normal.  Left Ear: External ear normal.  Nose: Nose normal.  Mouth/Throat: Oropharynx is clear and moist.  Eyes: Conjunctivae and EOM are normal. Pupils are equal, round, and reactive to light.  Neck: Normal range of motion. Neck supple. No JVD present. No tracheal deviation present.  Cardiovascular:  Normal rate, regular rhythm, normal heart sounds and intact distal pulses.   Pulmonary/Chest: Effort normal and breath sounds normal.  Abdominal: Soft. Bowel sounds are normal. There is no tenderness.  Musculoskeletal: Normal range of motion. Exhibits no edema.  Lymphadenopathy:  Has no cervical adenopathy.  Neurological: Pt is alert and oriented to person, place, and time. Pt has normal reflexes. No cranial nerve deficit.  Skin: Skin is warm and dry. No rash noted.  Psychiatric:  Has  normal mood and affect. Behavior is normal.     Assessment & Plan:

## 2012-09-25 NOTE — Assessment & Plan Note (Signed)
New onset, hold on OHA at this time, for diet, exercise, wt loss, refer to DM education

## 2012-09-25 NOTE — Assessment & Plan Note (Addendum)
Overall doing well, age appropriate education and counseling updated, referrals for preventative services and immunizations addressed, dietary and smoking counseling addressed, most recent labs and ECG reviewed.  I have personally reviewed and have noted: 1) the patient's medical and social history 2) The pt's use of alcohol, tobacco, and illicit drugs 3) The patient's current medications and supplements 4) Functional ability including ADL's, fall risk, home safety risk, hearing and visual impairment 5) Diet and physical activities 6) Evidence for depression or mood disorder 7) The patient's height, weight, and BMI have been recorded in the chart I have made referrals, and provided counseling and education based on review of the above ECG reviewed as per emr, declines colonoscopy for now

## 2012-11-18 ENCOUNTER — Other Ambulatory Visit: Payer: Self-pay

## 2012-11-22 ENCOUNTER — Ambulatory Visit (INDEPENDENT_AMBULATORY_CARE_PROVIDER_SITE_OTHER): Payer: PRIVATE HEALTH INSURANCE | Admitting: Internal Medicine

## 2012-11-22 ENCOUNTER — Encounter: Payer: Self-pay | Admitting: Internal Medicine

## 2012-11-22 VITALS — BP 108/64 | HR 100 | Temp 99.9°F | Ht 72.0 in | Wt 339.0 lb

## 2012-11-22 DIAGNOSIS — J019 Acute sinusitis, unspecified: Secondary | ICD-10-CM

## 2012-11-22 DIAGNOSIS — I10 Essential (primary) hypertension: Secondary | ICD-10-CM

## 2012-11-22 DIAGNOSIS — J329 Chronic sinusitis, unspecified: Secondary | ICD-10-CM | POA: Insufficient documentation

## 2012-11-22 MED ORDER — ROSUVASTATIN CALCIUM 20 MG PO TABS: 20.0000 mg | ORAL_TABLET | Freq: Every day | ORAL | Status: DC

## 2012-11-22 MED ORDER — LISINOPRIL 20 MG PO TABS: 20.0000 mg | ORAL_TABLET | Freq: Every day | ORAL | Status: DC

## 2012-11-22 MED ORDER — HYDROCODONE-HOMATROPINE 5-1.5 MG/5ML PO SYRP: 5.0000 mL | ORAL_SOLUTION | Freq: Four times a day (QID) | ORAL | Status: DC | PRN

## 2012-11-22 MED ORDER — LEVOFLOXACIN 250 MG PO TABS: 250.0000 mg | ORAL_TABLET | Freq: Every day | ORAL | Status: DC

## 2012-11-22 NOTE — Patient Instructions (Signed)
Please take all new medication as prescribed Please continue all other medications as before, and refills have been done if requested. Please continue your efforts at being more active, low cholesterol diet, and weight control. Thank you for enrolling in MyChart. Please follow the instructions below to securely access your online medical record. MyChart allows you to send messages to your doctor, view your test results, renew your prescriptions, schedule appointments, and more.

## 2012-11-22 NOTE — Assessment & Plan Note (Signed)
Mild increased a1c recent, declines dietary education referral for now, to work on diet/excercise/lifestyle changes on his own for now, f/u a1c next visit, consider OHA for > 7 Lab Results  Component Value Date   HGBA1C 6.8* 09/22/2012

## 2012-11-22 NOTE — Progress Notes (Signed)
  Subjective:    Patient ID: Martin Lozano, male    DOB: July 25, 1961, 52 y.o.   MRN: [id number]  HPI   Here with 2-3 days acute onset fever, facial pain, pressure, headache, general weakness and malaise, and greenish d/c, with mild ST and cough, but pt denies chest pain, wheezing, increased sob or doe, orthopnea, PND, increased LE swelling, palpitations, dizziness or syncope.   Pt denies polydipsia, polyuria.  Pt states overall good compliance with meds, trying to follow lower cholesterol, diabetic diet, wt overall stable but little exercise however, plans to try to change this, and better diet.  Pt denies new neurological symptoms such as new headache, or facial or extremity weakness or numbness  Past Medical History  Diagnosis Date  . ALLERGIC RHINITIS 09/06/2007  . ASTHMATIC BRONCHITIS, ACUTE 11/16/2007  . HEMORRHOIDS 07/03/2007  . HYPERLIPIDEMIA 09/06/2007  . HYPERTENSION 07/03/2007  . KNEE SPRAIN, LEFT 07/30/2008  . OBESITY, MORBID 07/03/2007  . Other tenosynovitis of hand and wrist 06/11/2010  . OTITIS MEDIA, ACUTE, LEFT 11/10/2009  . Wheezing 09/09/2009  . Type II or unspecified type diabetes mellitus without mention of complication, uncontrolled 09/25/2012   No past surgical history on file.  reports that he has never smoked. He does not have any smokeless tobacco history on file. He reports that he does not drink alcohol or use illicit drugs. family history is not on file. No Known Allergies Current Outpatient Prescriptions on File Prior to Visit  Medication Sig Dispense Refill  . aspirin 81 MG tablet Take 81 mg by mouth daily.        . traMADol (ULTRAM) 50 MG tablet Take 1 tablet (50 mg total) by mouth every 6 (six) hours as needed for pain.  60 tablet  5   No current facility-administered medications on file prior to visit.   Review of Systems  Constitutional: Negative for unexpected weight change, or unusual diaphoresis  HENT: Negative for tinnitus.   Eyes: Negative for photophobia  and visual disturbance.  Respiratory: Negative for choking and stridor.   Gastrointestinal: Negative for vomiting and blood in stool.  Genitourinary: Negative for hematuria and decreased urine volume.  Musculoskeletal: Negative for acute joint swelling Skin: Negative for color change and wound.  Neurological: Negative for tremors and numbness other than noted  Psychiatric/Behavioral: Negative for decreased concentration or  hyperactivity.       Objective:   Physical Exam BP 108/64  Pulse 100  Temp(Src) 99.9 F (37.7 C) (Oral)  Ht 6' (1.829 m)  Wt 339 lb (153.769 kg)  BMI 45.97 kg/m2  SpO2 98% N/A VS noted,  Constitutional: Pt appears well-developed and well-nourished.  HENT: Head: NCAT.  Right Ear: External ear normal.  Left Ear: External ear normal.  Eyes: Conjunctivae and EOM are normal. Pupils are equal, round, and reactive to light.  Neck: Normal range of motion. Neck supple.  Bilat tm's with mild erythema.  Max sinus areas mild tender.  Pharynx with mild erythema, no exudate Cardiovascular: Normal rate and regular rhythm.   Pulmonary/Chest: Effort normal and breath sounds normal.  Neurological: Pt is alert. Not confused  Skin: Skin is warm. No erythema.  Psychiatric: Pt behavior is normal. Thought content normal.     Assessment & Plan:

## 2012-11-22 NOTE — Assessment & Plan Note (Signed)
stable overall by history and exam, recent data reviewed with pt, and pt to continue medical treatment as before,  to f/u any worsening symptoms or concerns BP Readings from Last 3 Encounters:  11/22/12 108/64  09/25/12 120/68  06/28/12 100/64

## 2013-01-08 ENCOUNTER — Telehealth: Payer: Self-pay | Admitting: Internal Medicine

## 2013-01-08 ENCOUNTER — Telehealth: Payer: Self-pay

## 2013-01-08 ENCOUNTER — Encounter: Payer: Self-pay | Admitting: Internal Medicine

## 2013-01-08 MED ORDER — ALPRAZOLAM 0.5 MG PO TABS: 1.0000 mg | ORAL_TABLET | Freq: Two times a day (BID) | ORAL | Status: DC | PRN

## 2013-01-08 NOTE — Telephone Encounter (Signed)
Done hardcopy to robin  Per pt email

## 2013-01-08 NOTE — Telephone Encounter (Signed)
Will forward to Dr. Jonny Ruiz

## 2013-01-08 NOTE — Telephone Encounter (Signed)
Faxed hardcopy to [facility] [address] [place]

## 2013-01-08 NOTE — Telephone Encounter (Signed)
A user error has taken place: encounter opened in error, closed for administrative reasons.

## 2013-05-03 ENCOUNTER — Encounter: Payer: Self-pay | Admitting: Internal Medicine

## 2013-05-03 ENCOUNTER — Ambulatory Visit (INDEPENDENT_AMBULATORY_CARE_PROVIDER_SITE_OTHER): Payer: PRIVATE HEALTH INSURANCE | Admitting: Internal Medicine

## 2013-05-03 VITALS — BP 110/70 | HR 81 | Temp 98.2°F | Ht 72.0 in | Wt 339.5 lb

## 2013-05-03 DIAGNOSIS — J209 Acute bronchitis, unspecified: Secondary | ICD-10-CM | POA: Insufficient documentation

## 2013-05-03 DIAGNOSIS — E785 Hyperlipidemia, unspecified: Secondary | ICD-10-CM

## 2013-05-03 DIAGNOSIS — I10 Essential (primary) hypertension: Secondary | ICD-10-CM

## 2013-05-03 DIAGNOSIS — Z0000 Encounter for general adult medical examination without abnormal findings: Secondary | ICD-10-CM

## 2013-05-03 MED ORDER — AZITHROMYCIN 250 MG PO TABS: ORAL_TABLET | ORAL | Status: DC

## 2013-05-03 MED ORDER — HYDROCODONE-HOMATROPINE 5-1.5 MG/5ML PO SYRP: 5.0000 mL | ORAL_SOLUTION | Freq: Four times a day (QID) | ORAL | Status: DC | PRN

## 2013-05-03 NOTE — Patient Instructions (Signed)
Please take all new medication as prescribed - the antibiotic , and cough medicine Please continue all other medications as before, and refills have been done if requested. Please have the pharmacy call with any other refills you may need. Please keep your appointments with your specialists as you may have planned Please continue your efforts at being more active, low cholesterol diet, and weight control. Please go to the LAB in the Basement (turn left off the elevator) for the tests to be done today You will be contacted by phone if any changes need to be made immediately.  Otherwise, you will receive a letter about your results with an explanation, but please check with MyChart first.  Please remember to sign up for My Chart if you have not done so, as this will be important to you in the future with finding out test results, communicating by private email, and scheduling acute appointments online when needed.  Please return in 6 months, or sooner if needed, with Lab testing done 3-5 days before

## 2013-05-03 NOTE — Progress Notes (Signed)
Subjective:    Patient ID: Martin Lozano, male    DOB: Jan 01, 1961, 52 y.o.   MRN: [id number]  HPI  Here with acute onset mild to mod 2-3 days ST, HA, general weakness and malaise, with prod cough greenish sputum, but Pt denies chest pain, increased sob or doe, wheezing, orthopnea, PND, increased LE swelling, palpitations, dizziness or syncope.  Pt denies polydipsia, polyuria, or low sugar symptoms such as weakness or confusion improved with po intake.  Pt denies new neurological symptoms such as new headache, or facial or extremity weakness or numbness.   Pt states overall good compliance with meds, has been trying to follow lower cholesterol, diabetic diet, with wt overall stable,  but little exercise however. Past Medical History  Diagnosis Date  . ALLERGIC RHINITIS 09/06/2007  . ASTHMATIC BRONCHITIS, ACUTE 11/16/2007  . HEMORRHOIDS 07/03/2007  . HYPERLIPIDEMIA 09/06/2007  . HYPERTENSION 07/03/2007  . KNEE SPRAIN, LEFT 07/30/2008  . OBESITY, MORBID 07/03/2007  . Other tenosynovitis of hand and wrist 06/11/2010  . OTITIS MEDIA, ACUTE, LEFT 11/10/2009  . Wheezing 09/09/2009  . Type II or unspecified type diabetes mellitus without mention of complication, uncontrolled 09/25/2012   No past surgical history on file.  reports that he has never smoked. He does not have any smokeless tobacco history on file. He reports that he does not drink alcohol or use illicit drugs. family history is not on file. No Known Allergies Current Outpatient Prescriptions on File Prior to Visit  Medication Sig Dispense Refill  . ALPRAZolam (XANAX) 0.5 MG tablet Take 2 tablets (1 mg total) by mouth 2 (two) times daily as needed for anxiety.  20 tablet  0  . aspirin 81 MG tablet Take 81 mg by mouth daily.        Marland Kitchen lisinopril (PRINIVIL,ZESTRIL) 20 MG tablet Take 1 tablet (20 mg total) by mouth daily.  90 tablet  3  . rosuvastatin (CRESTOR) 20 MG tablet Take 1 tablet (20 mg total) by mouth daily.  90 tablet  3  . traMADol  (ULTRAM) 50 MG tablet Take 1 tablet (50 mg total) by mouth every 6 (six) hours as needed for pain.  60 tablet  5   No current facility-administered medications on file prior to visit.   Review of Systems  Constitutional: Negative for unexpected weight change, or unusual diaphoresis  HENT: Negative for tinnitus.   Eyes: Negative for photophobia and visual disturbance.  Respiratory: Negative for choking and stridor.   Gastrointestinal: Negative for vomiting and blood in stool.  Genitourinary: Negative for hematuria and decreased urine volume.  Musculoskeletal: Negative for acute joint swelling Skin: Negative for color change and wound.  Neurological: Negative for tremors and numbness other than noted  Psychiatric/Behavioral: Negative for decreased concentration or  hyperactivity.       Objective:   Physical Exam BP 110/70  Pulse 81  Temp(Src) 98.2 F (36.8 C) (Oral)  Ht 6' (1.829 m)  Wt 339 lb 8 oz (153.996 kg)  BMI 46.03 kg/m2  SpO2 97% VS noted, mild ill Constitutional: Pt appears well-developed and well-nourished.  HENT: Head: NCAT.  Right Ear: External ear normal.  Left Ear: External ear normal.  Bilat tm's with mild erythema.  Max sinus areas mild tender.  Pharynx with mild erythema, no exudate Eyes: Conjunctivae and EOM are normal. Pupils are equal, round, and reactive to light.  Neck: Normal range of motion. Neck supple.  Cardiovascular: Normal rate and regular rhythm.   Pulmonary/Chest: Effort normal and breath sounds  normal.  Neurological: Pt is alert. Not confused  Skin: Skin is warm. No erythema.  Psychiatric: Pt behavior is normal. Thought content normal.     Assessment & Plan:

## 2013-05-06 NOTE — Assessment & Plan Note (Signed)
stable overall by history and exam, recent data reviewed with pt, and pt to continue medical treatment as before,  to f/u any worsening symptoms or concerns Lab Results  Component Value Date   LDLCALC 69 09/21/2012

## 2013-05-06 NOTE — Assessment & Plan Note (Signed)
Lab Results  Component Value Date   HGBA1C 6.8* 09/22/2012

## 2013-05-06 NOTE — Assessment & Plan Note (Signed)
stable overall by history and exam, recent data reviewed with pt, and pt to continue medical treatment as before,  to f/u any worsening symptoms or concerns BP Readings from Last 3 Encounters:  05/03/13 110/70  11/22/12 108/64  09/25/12 120/68

## 2013-05-06 NOTE — Assessment & Plan Note (Signed)
Mild to mod, for antibx course,  to f/u any worsening symptoms or concerns 

## 2013-08-09 ENCOUNTER — Other Ambulatory Visit: Payer: Self-pay

## 2013-08-24 ENCOUNTER — Telehealth: Payer: Self-pay

## 2013-08-24 DIAGNOSIS — Z0000 Encounter for general adult medical examination without abnormal findings: Secondary | ICD-10-CM

## 2013-08-24 NOTE — Telephone Encounter (Signed)
CPX labs entered  

## 2013-11-01 ENCOUNTER — Other Ambulatory Visit (INDEPENDENT_AMBULATORY_CARE_PROVIDER_SITE_OTHER): Payer: PRIVATE HEALTH INSURANCE

## 2013-11-01 DIAGNOSIS — E1165 Type 2 diabetes mellitus with hyperglycemia: Principal | ICD-10-CM

## 2013-11-01 DIAGNOSIS — E785 Hyperlipidemia, unspecified: Secondary | ICD-10-CM

## 2013-11-01 DIAGNOSIS — IMO0001 Reserved for inherently not codable concepts without codable children: Secondary | ICD-10-CM

## 2013-11-01 LAB — URINALYSIS, ROUTINE W REFLEX MICROSCOPIC
Ketones, ur: NEGATIVE
LEUKOCYTES UA: NEGATIVE
Nitrite: NEGATIVE
Specific Gravity, Urine: >=1.03 — AB (ref 1.000–1.030)
Total Protein, Urine: NEGATIVE
UROBILINOGEN UA: 0.2 (ref 0.0–1.0)
Urine Glucose: 250 — AB
pH: 5.5 (ref 5.0–8.0)

## 2013-11-01 LAB — BASIC METABOLIC PANEL
BUN: 15 mg/dL (ref 6–23)
CALCIUM: 9.1 mg/dL (ref 8.4–10.5)
CO2: 30 mEq/L (ref 19–32)
Chloride: 103 mEq/L (ref 96–112)
Creatinine, Ser: 1 mg/dL (ref 0.4–1.5)
GFR: 87.71 mL/min (ref >60.00)
Glucose, Bld: 131 mg/dL — ABNORMAL HIGH (ref 70–99)
Potassium: 4.1 mEq/L (ref 3.5–5.1)
SODIUM: 138 meq/L (ref 135–145)

## 2013-11-01 LAB — HEPATIC FUNCTION PANEL
ALT: 29 U/L (ref 0–53)
AST: 25 U/L (ref 0–37)
Albumin: 3.9 g/dL (ref 3.5–5.2)
Alkaline Phosphatase: 65 U/L (ref 39–117)
Bilirubin, Direct: 0 mg/dL (ref 0.0–0.3)
Total Bilirubin: 0.7 mg/dL (ref 0.3–1.2)
Total Protein: 6.8 g/dL (ref 6.0–8.3)

## 2013-11-01 LAB — CBC WITH DIFFERENTIAL/PLATELET
BASOS ABS: 0 10*3/uL (ref 0.0–0.1)
Basophils Relative: 0 % (ref 0.0–3.0)
EOS ABS: 0.3 10*3/uL (ref 0.0–0.7)
EOS PCT: 3.9 % (ref 0.0–5.0)
HCT: 41 % (ref 39.0–52.0)
Hemoglobin: 13.6 g/dL (ref 13.0–17.0)
Lymphocytes Relative: 31.5 % (ref 12.0–46.0)
Lymphs Abs: 2.6 10*3/uL (ref 0.7–4.0)
MCHC: 33.2 g/dL (ref 30.0–36.0)
MCV: 89 fl (ref 78.0–100.0)
MONO ABS: 0.5 10*3/uL (ref 0.1–1.0)
Monocytes Relative: 6.2 % (ref 3.0–12.0)
Neutro Abs: 4.8 10*3/uL (ref 1.4–7.7)
Neutrophils Relative %: 58.4 % (ref 43.0–77.0)
PLATELETS: 246 10*3/uL (ref 150.0–400.0)
RBC: 4.61 Mil/uL (ref 4.22–5.81)
RDW: 12.9 % (ref 11.5–14.6)
WBC: 8.3 10*3/uL (ref 4.5–10.5)

## 2013-11-01 LAB — LIPID PANEL
CHOLESTEROL: 117 mg/dL (ref 0–200)
HDL: 29.1 mg/dL — AB (ref >39.00)
LDL Cholesterol: 62 mg/dL (ref 0–99)
TRIGLYCERIDES: 132 mg/dL (ref 0.0–149.0)
Total CHOL/HDL Ratio: 4
VLDL: 26.4 mg/dL (ref 0.0–40.0)

## 2013-11-01 LAB — TSH: TSH: 0.87 u[IU]/mL (ref 0.35–5.50)

## 2013-11-01 LAB — MICROALBUMIN / CREATININE URINE RATIO
Creatinine,U: 321.3 mg/dL
MICROALB/CREAT RATIO: 0.2 mg/g (ref 0.0–30.0)
Microalb, Ur: 0.8 mg/dL (ref 0.0–1.9)

## 2013-11-01 LAB — HEMOGLOBIN A1C: Hgb A1c MFr Bld: 6.6 % — ABNORMAL HIGH (ref 4.6–6.5)

## 2013-11-01 LAB — PSA: PSA: 0.29 ng/mL (ref 0.10–4.00)

## 2013-11-09 ENCOUNTER — Encounter: Payer: Self-pay | Admitting: Internal Medicine

## 2013-11-09 ENCOUNTER — Ambulatory Visit (INDEPENDENT_AMBULATORY_CARE_PROVIDER_SITE_OTHER): Payer: PRIVATE HEALTH INSURANCE | Admitting: Internal Medicine

## 2013-11-09 VITALS — BP 102/62 | HR 82 | Temp 98.1°F | Ht 71.5 in | Wt 338.5 lb

## 2013-11-09 DIAGNOSIS — I10 Essential (primary) hypertension: Secondary | ICD-10-CM

## 2013-11-09 DIAGNOSIS — IMO0001 Reserved for inherently not codable concepts without codable children: Secondary | ICD-10-CM

## 2013-11-09 DIAGNOSIS — Z0000 Encounter for general adult medical examination without abnormal findings: Secondary | ICD-10-CM

## 2013-11-09 DIAGNOSIS — E1165 Type 2 diabetes mellitus with hyperglycemia: Secondary | ICD-10-CM

## 2013-11-09 MED ORDER — ROSUVASTATIN CALCIUM 20 MG PO TABS: 20.0000 mg | ORAL_TABLET | Freq: Every day | ORAL | Status: DC

## 2013-11-09 MED ORDER — LISINOPRIL 20 MG PO TABS: 20.0000 mg | ORAL_TABLET | Freq: Every day | ORAL | Status: DC

## 2013-11-09 NOTE — Progress Notes (Signed)
Subjective:    Patient ID: Martin RossettiRonald B Krontz, male    DOB: 1961/04/05, 53 y.o.   MRN: [id number]  HPI  /Here for wellness and f/u;  Overall doing ok;  Pt denies CP, worsening SOB, DOE, wheezing, orthopnea, PND, worsening LE edema, palpitations, dizziness or syncope.  Pt denies neurological change such as new headache, facial or extremity weakness.  Pt denies polydipsia, polyuria, or low sugar symptoms. Pt states overall good compliance with treatment and medications, good tolerability, and has been trying to follow lower cholesterol diet.  Pt denies worsening depressive symptoms, suicidal ideation or panic. No fever, night sweats, wt loss, loss of appetite, or other constitutional symptoms.  Pt states good ability with ADL's, has low fall risk, home safety reviewed and adequate, no other significant changes in hearing or vision, and only occasionally active with exercise. Plans to try do more. 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 No new complaints. Denies OSA symtpoms. Hard to lose wt Past Medical History  Diagnosis Date  . ALLERGIC RHINITIS 09/06/2007  . ASTHMATIC BRONCHITIS, ACUTE 11/16/2007  . HEMORRHOIDS 07/03/2007  . HYPERLIPIDEMIA 09/06/2007  . HYPERTENSION 07/03/2007  . KNEE SPRAIN, LEFT 07/30/2008  . OBESITY, MORBID 07/03/2007  . Other tenosynovitis of hand and wrist 06/11/2010  . OTITIS MEDIA, ACUTE, LEFT 11/10/2009  . Wheezing 09/09/2009  . Type II or unspecified type diabetes mellitus without mention of complication, uncontrolled 09/25/2012   No past surgical history on file.  reports that he has never smoked. He does not have any smokeless tobacco history on file. He reports that he does not drink alcohol or use illicit drugs. family history is not on file. No Known Allergies Current Outpatient Prescriptions on File Prior to Visit  Medication Sig Dispense Refill  . aspirin 81 MG tablet Take 81 mg by mouth daily.         No current facility-administered medications on  file prior to visit.   Review of Systems Constitutional: Negative for diaphoresis, activity change, appetite change or unexpected weight change.  HENT: Negative for hearing loss, ear pain, facial swelling, mouth sores and neck stiffness.   Eyes: Negative for pain, redness and visual disturbance.  Respiratory: Negative for shortness of breath and wheezing.   Cardiovascular: Negative for chest pain and palpitations.  Gastrointestinal: Negative for diarrhea, blood in stool, abdominal distention or other pain Genitourinary: Negative for hematuria, flank pain or change in urine volume.  Musculoskeletal: Negative for myalgias and joint swelling.  Skin: Negative for color change and wound.  Neurological: Negative for syncope and numbness. other than noted Hematological: Negative for adenopathy.  Psychiatric/Behavioral: Negative for hallucinations, self-injury, decreased concentration and agitation.      Objective:   Physical Exam BP 102/62  Pulse 82  Temp(Src) 98.1 F (36.7 C) (Oral)  Ht 5' 11.5" (1.816 m)  Wt 338 lb 8 oz (153.543 kg)  BMI 46.56 kg/m2  SpO2 98% VS noted,  Constitutional: Pt is oriented to person, place, and time. Appears well-developed and well-nourished. /morbid obese Head: Normocephalic and atraumatic.  Right Ear: External ear normal.  Left Ear: External ear normal.  Nose: Nose normal.  Mouth/Throat: Oropharynx is clear and moist.  Eyes: Conjunctivae and EOM are normal. Pupils are equal, round, and reactive to light.  Neck: Normal range of motion. Neck supple. No JVD present. No tracheal deviation present.  Cardiovascular: Normal rate, regular rhythm, normal heart sounds and intact distal pulses.   Pulmonary/Chest: Effort normal and breath sounds normal.  Abdominal: Soft.  Bowel sounds are normal. There is no tenderness. No HSM  Musculoskeletal: Normal range of motion. Exhibits no edema.  Lymphadenopathy:  Has no cervical adenopathy.  Neurological: Pt is alert and  oriented to person, place, and time. Pt has normal reflexes. No cranial nerve deficit.  Skin: Skin is warm and dry. No rash noted.  Psychiatric:  Has  normal mood and affect. Behavior is normal.     Assessment & Plan:

## 2013-11-09 NOTE — Assessment & Plan Note (Signed)
stable overall by history and exam, recent data reviewed with pt, and pt to continue medical treatment as before,  to f/u any worsening symptoms or concerns Lab Results  Component Value Date   HGBA1C 6.6* 11/01/2013

## 2013-11-09 NOTE — Assessment & Plan Note (Signed)
stable overall by history and exam, recent data reviewed with pt, and pt to continue medical treatment as before,  to f/u any worsening symptoms or concerns BP Readings from Last 3 Encounters:  11/09/13 102/62  05/03/13 110/70  11/22/12 108/64

## 2013-11-09 NOTE — Progress Notes (Signed)
Pre-visit discussion using our clinic review tool. No additional management support is needed unless otherwise documented below in the visit note.  

## 2013-11-09 NOTE — Patient Instructions (Signed)
Please continue all other medications as before, and refills have been done if requested. Please have the pharmacy call with any other refills you may need.  Please continue your efforts at being more active, low cholesterol diet, and weight control. You are otherwise up to date with prevention measures today.  Please keep your appointments with your specialists as you may have planned  Your EKG and lab work was OK today  Please remember to sign up for MyChart if you have not done so, as this will be important to you in the future with finding out test results, communicating by private email, and scheduling acute appointments online when needed.  Please return in 6 months, or sooner if needed, with Lab testing done 3-5 days before

## 2013-11-09 NOTE — Assessment & Plan Note (Addendum)

## 2013-11-14 ENCOUNTER — Telehealth: Payer: Self-pay

## 2013-11-14 NOTE — Telephone Encounter (Signed)
Relevant patient education assigned to patient using Emmi. ° °

## 2014-05-09 ENCOUNTER — Telehealth: Payer: Self-pay | Admitting: Internal Medicine

## 2014-05-09 ENCOUNTER — Ambulatory Visit: Payer: PRIVATE HEALTH INSURANCE | Admitting: Internal Medicine

## 2014-05-09 DIAGNOSIS — Z0289 Encounter for other administrative examinations: Secondary | ICD-10-CM

## 2014-05-09 NOTE — Telephone Encounter (Signed)
Patient no showed for office visit 08/06.  He did call back in later on wanting to know if it was a must for him to reschedule this appt?

## 2014-05-09 NOTE — Telephone Encounter (Signed)
Ok for ROV next available 

## 2014-05-10 NOTE — Telephone Encounter (Signed)
Left vm to call back to reschedule

## 2014-06-07 ENCOUNTER — Encounter: Payer: Self-pay | Admitting: Internal Medicine

## 2014-06-07 ENCOUNTER — Ambulatory Visit (INDEPENDENT_AMBULATORY_CARE_PROVIDER_SITE_OTHER): Payer: PRIVATE HEALTH INSURANCE | Admitting: Internal Medicine

## 2014-06-07 VITALS — BP 108/60 | HR 92 | Temp 98.3°F | Resp 12 | Wt 328.1 lb

## 2014-06-07 DIAGNOSIS — J310 Chronic rhinitis: Secondary | ICD-10-CM

## 2014-06-07 DIAGNOSIS — J029 Acute pharyngitis, unspecified: Secondary | ICD-10-CM

## 2014-06-07 MED ORDER — AMOXICILLIN 500 MG PO CAPS: 500.0000 mg | ORAL_CAPSULE | Freq: Three times a day (TID) | ORAL | Status: DC

## 2014-06-07 NOTE — Patient Instructions (Signed)
Zicam Melts or Zinc lozenges as per package label for sore throat . Complementary options include  vitamin C 2000 mg daily; & Echinacea for 4-7 days. Report persistent or progressive fever; discolored nasal or chest secretions; or frontal headache or facial  pain.     Plain Mucinex (NOT D) for thick secretions ;force NON dairy fluids .   Nasal cleansing in the shower as discussed with lather of mild shampoo.After 10 seconds wash off lather while  exhaling through nostrils. Make sure that all residual soap is removed to prevent irritation.  Flonase OR Nasacort AQ 1 spray in each nostril twice a day as needed. Use the "crossover" technique into opposite nostril spraying toward opposite ear @ 45 degree angle, not straight up into nostril.  Use a Neti pot daily only  as needed for significant sinus congestion; going from open side to congested side . Plain Allegra (NOT D )  160 daily , Loratidine 10 mg , OR Zyrtec 10 mg @ bedtime  as needed for itchy eyes & sneezing.  Fill the  prescription for antibiotic it there is not dramatic improvement in the next 72 hours. 

## 2014-06-07 NOTE — Progress Notes (Signed)
   Subjective:    Patient ID: Martin Lozano, male    DOB: 02/22/1961, 53 y.o.   MRN: [id number]  HPI  He was in close contact with an ill individual 06/05/14. Apparently they were sitting beside each other @ a card table when that person became ill and threw up; he was actually exposed to the vomitus.  He has developed some sore throat and itchy, watery eyes as well as some sneezing.  He had scant nasal purulence.  He has not initiated any treatment.  Review of Systems  Frontal headache, facial pain , nasal purulence, dental pain, sore throat , otic pain or otic discharge denied. No fever , chills or sweats.      Objective:   Physical Exam General appearance:adequately  nourished; no acute distress or increased work of breathing is present.  No  lymphadenopathy about the head, neck, or axilla noted.   As per CDC Guidelines ,Epic documents severe obesity as being present . Eyes: No conjunctival inflammation or lid edema is present. There is no scleral icterus. Ears:  External ear exam shows no significant lesions or deformities.  Otoscopic examination reveals clear canals, tympanic membranes are intact bilaterally without bulging, retraction, inflammation or discharge. Nose:  External nasal examination shows no deformity or inflammation. Nasal mucosa are pink and moist without lesions or exudates. No septal dislocation or deviation.No obstruction to airflow.  Oral exam: Dental hygiene is good; lips and gums are healthy appearing.There is no oropharyngeal erythema or exudate noted.  Neck:  No deformities, thyromegaly, masses, or tenderness noted.   Supple with full range of motion without pain.  Heart:  Normal rate and regular rhythm. S1 and S2 normal without gallop, murmur, click, rub or other extra sounds. Lungs:Chest clear to auscultation; no wheezes, rhonchi,rales ,or rubs present.No increased work of breathing.   Extremities:  No cyanosis, edema, or clubbing  noted  Skin: Warm & dry  w/o jaundice or tenting.         Assessment & Plan:  #1 non allergic rhinitis with sore throat #2 possible viral gastroenteritis exposure See orders & AVS

## 2014-06-07 NOTE — Progress Notes (Signed)
Pre visit review using our clinic review tool, if applicable. No additional management support is needed unless otherwise documented below in the visit note. 

## 2014-06-11 ENCOUNTER — Telehealth: Payer: Self-pay | Admitting: Internal Medicine

## 2014-06-11 MED ORDER — HYDROCODONE-HOMATROPINE 5-1.5 MG/5ML PO SYRP: 5.0000 mL | ORAL_SOLUTION | Freq: Four times a day (QID) | ORAL | Status: DC | PRN

## 2014-06-11 NOTE — Telephone Encounter (Signed)
Patient advised he needs to pick up this script since it can not be called to the pharmacy. Script is at the front desk.

## 2014-06-11 NOTE — Telephone Encounter (Signed)
Patient seen dr. Alwyn Ren last week for sore throat.  He is requesting a script for cough syrup to be called [facility] to [facility] [place].

## 2014-06-11 NOTE — Telephone Encounter (Signed)
Hydromet 1 tsp q 6 hrs prn #100 cc

## 2014-06-28 ENCOUNTER — Telehealth: Payer: Self-pay | Admitting: *Deleted

## 2014-06-28 MED ORDER — HYDROCODONE-HOMATROPINE 5-1.5 MG/5ML PO SYRP: 5.0000 mL | ORAL_SOLUTION | Freq: Four times a day (QID) | ORAL | Status: DC | PRN

## 2014-06-28 NOTE — Telephone Encounter (Signed)
Done hardcopy to stephanie 

## 2014-06-28 NOTE — Telephone Encounter (Signed)
Left msg on triage stating his cough is not better wanting a refill on cough syrup...Raechel Chute

## 2014-09-13 ENCOUNTER — Telehealth: Payer: Self-pay | Admitting: Internal Medicine

## 2014-09-13 MED ORDER — ALPRAZOLAM 0.5 MG PO TABS: 0.5000 mg | ORAL_TABLET | Freq: Every day | ORAL | Status: DC | PRN

## 2014-09-13 NOTE — Telephone Encounter (Signed)
Done hardcopy to robin  

## 2014-09-13 NOTE — Telephone Encounter (Signed)
Patient is taking a couple flights.  The first is next Tuesday and the other is two weeks after that.  He is requesting a med to be called in for his flight since he does not like flying.  He is requesting what ever Dr. Jonny RuizJohn sent in last time he flew.  [facility] [place].

## 2014-09-16 NOTE — Telephone Encounter (Signed)
[facility]

## 2014-10-22 ENCOUNTER — Encounter: Payer: Self-pay | Admitting: Internal Medicine

## 2014-10-22 ENCOUNTER — Ambulatory Visit (INDEPENDENT_AMBULATORY_CARE_PROVIDER_SITE_OTHER): Payer: PRIVATE HEALTH INSURANCE | Admitting: Internal Medicine

## 2014-10-22 VITALS — BP 138/82 | HR 69 | Temp 97.7°F | Wt 334.0 lb

## 2014-10-22 DIAGNOSIS — Z0000 Encounter for general adult medical examination without abnormal findings: Secondary | ICD-10-CM

## 2014-10-22 DIAGNOSIS — J069 Acute upper respiratory infection, unspecified: Secondary | ICD-10-CM | POA: Diagnosis not present

## 2014-10-22 DIAGNOSIS — E119 Type 2 diabetes mellitus without complications: Secondary | ICD-10-CM | POA: Diagnosis not present

## 2014-10-22 MED ORDER — AZITHROMYCIN 250 MG PO TABS: ORAL_TABLET | ORAL | Status: DC

## 2014-10-22 MED ORDER — HYDROCODONE-HOMATROPINE 5-1.5 MG/5ML PO SYRP: 5.0000 mL | ORAL_SOLUTION | Freq: Four times a day (QID) | ORAL | Status: DC | PRN

## 2014-10-22 NOTE — Progress Notes (Signed)
Pre visit review using our clinic review tool, if applicable. No additional management support is needed unless otherwise documented below in the visit note. 

## 2014-10-22 NOTE — Assessment & Plan Note (Signed)
Mild to mod, for antibx course,  to f/u any worsening symptoms or concerns 

## 2014-10-22 NOTE — Assessment & Plan Note (Signed)
stable overall by history and exam, recent data reviewed with pt, and pt to continue medical treatment as before,  to f/u any worsening symptoms or concerns Lab Results  Component Value Date   HGBA1C 6.6* 11/01/2013

## 2014-10-22 NOTE — Patient Instructions (Signed)
Please take all new medication as prescribed - the antibiotic, and cough medicine  Please continue all other medications as before, and refills have been done if requested.  Please have the pharmacy call with any other refills you may need.  Please continue your efforts at being more active, low cholesterol diet, and weight control.  You are otherwise up to date with prevention measures today.  Please keep your appointments with your specialists as you may have planned  Please go to the LAB in the Basement (turn left off the elevator) for the tests to be done at your convenience  You will be contacted by phone if any changes need to be made immediately.  Otherwise, you will receive a letter about your results with an explanation, but please check with MyChart first.  Please remember to sign up for MyChart if you have not done so, as this will be important to you in the future with finding out test results, communicating by private email, and scheduling acute appointments online when needed.  Please return in 6 months, or sooner if needed, with Lab testing done 3-5 days before

## 2014-10-22 NOTE — Progress Notes (Signed)
Subjective:    Patient ID: Martin Lozano, male    DOB: 10-30-1960, 54 y.o.   MRN: [id number]  HPI  Here for wellness and f/u;  Overall doing ok;  Pt denies CP, worsening SOB, DOE, wheezing, orthopnea, PND, worsening LE edema, palpitations, dizziness or syncope.  Pt denies neurological change such as new headache, facial or extremity weakness.  Pt denies polydipsia, polyuria, or low sugar symptoms. Pt states overall good compliance with treatment and medications, good tolerability, and has been trying to follow lower cholesterol diet.  Pt denies worsening depressive symptoms, suicidal ideation or panic. No fever, night sweats, wt loss, loss of appetite, or other constitutional symptoms.  Pt states good ability with ADL's, has low fall risk, home safety reviewed and adequate, no other significant changes in hearing or vision, and only occasionally active with exercise.  Also incidentally  Here with 2-3 days acute onset fever, facial pain, pressure, headache, general weakness and malaise, and greenish d/c, with mild ST and cough.   Past Medical History  Diagnosis Date  . ALLERGIC RHINITIS 09/06/2007  . ASTHMATIC BRONCHITIS, ACUTE 11/16/2007  . HEMORRHOIDS 07/03/2007  . HYPERLIPIDEMIA 09/06/2007  . HYPERTENSION 07/03/2007  . KNEE SPRAIN, LEFT 07/30/2008  . OBESITY, MORBID 07/03/2007  . Other tenosynovitis of hand and wrist 06/11/2010  . OTITIS MEDIA, ACUTE, LEFT 11/10/2009  . Wheezing 09/09/2009  . Type II or unspecified type diabetes mellitus without mention of complication, uncontrolled 09/25/2012   No past surgical history on file.  reports that he has never smoked. He does not have any smokeless tobacco history on file. He reports that he does not drink alcohol or use illicit drugs. family history is not on file. No Known Allergies Current Outpatient Prescriptions on File Prior to Visit  Medication Sig Dispense Refill  . aspirin 81 MG tablet Take 81 mg by mouth daily.      Marland Kitchen. lisinopril  (PRINIVIL,ZESTRIL) 20 MG tablet Take 1 tablet (20 mg total) by mouth daily. 90 tablet 3  . rosuvastatin (CRESTOR) 20 MG tablet Take 1 tablet (20 mg total) by mouth daily. 90 tablet 3   No current facility-administered medications on file prior to visit.   Review of Systems Constitutional: Negative for increased diaphoresis, other activity, appetite or other siginficant weight change  HENT: Negative for worsening hearing loss, ear pain, facial swelling, mouth sores and neck stiffness.   Eyes: Negative for other worsening pain, redness or visual disturbance.  Respiratory: Negative for shortness of breath and wheezing.   Cardiovascular: Negative for chest pain and palpitations.  Gastrointestinal: Negative for diarrhea, blood in stool, abdominal distention or other pain Genitourinary: Negative for hematuria, flank pain or change in urine volume.  Musculoskeletal: Negative for myalgias or other joint complaints.  Skin: Negative for color change and wound.  Neurological: Negative for syncope and numbness. other than noted Hematological: Negative for adenopathy. or other swelling Psychiatric/Behavioral: Negative for hallucinations, self-injury, decreased concentration or other worsening agitation.      Objective:   Physical Exam BP 138/82 mmHg  Pulse 69  Temp(Src) 97.7 F (36.5 C) (Oral)  Wt 334 lb (151.501 kg)  SpO2 98% VS noted, mild ill Constitutional: Pt is oriented to person, place, and time. Appears well-developed and well-nourished.  Head: Normocephalic and atraumatic.  Right Ear: External ear normal.  Left Ear: External ear normal.  Nose: Nose normal.  Bilat tm's with mild erythema.  Max sinus areas mild tender.  Pharynx with mild erythema, no exudate Mouth/Throat: Oropharynx is  clear and moist.  Eyes: Conjunctivae and EOM are normal. Pupils are equal, round, and reactive to light.  Neck: Normal range of motion. Neck supple. No JVD present. No tracheal deviation present.    Cardiovascular: Normal rate, regular rhythm, normal heart sounds and intact distal pulses.   Pulmonary/Chest: Effort normal and breath sounds without rales or wheezing  Abdominal: Soft. Bowel sounds are normal. NT. No HSM  Musculoskeletal: Normal range of motion. Exhibits no edema.  Lymphadenopathy:  Has no cervical adenopathy.  Neurological: Pt is alert and oriented to person, place, and time. Pt has normal reflexes. No cranial nerve deficit. Motor grossly intact Skin: Skin is warm and dry. No rash noted.  Psychiatric:  Has normal mood and affect. Behavior is normal.     Assessment & Plan:

## 2014-10-22 NOTE — Assessment & Plan Note (Signed)

## 2014-10-28 ENCOUNTER — Other Ambulatory Visit (INDEPENDENT_AMBULATORY_CARE_PROVIDER_SITE_OTHER): Payer: PRIVATE HEALTH INSURANCE

## 2014-10-28 DIAGNOSIS — Z0000 Encounter for general adult medical examination without abnormal findings: Secondary | ICD-10-CM | POA: Diagnosis not present

## 2014-10-28 DIAGNOSIS — E119 Type 2 diabetes mellitus without complications: Secondary | ICD-10-CM

## 2014-10-28 LAB — CBC WITH DIFFERENTIAL/PLATELET
Basophils Absolute: 0 10*3/uL (ref 0.0–0.1)
Basophils Relative: 0.4 % (ref 0.0–3.0)
Eosinophils Absolute: 0.2 10*3/uL (ref 0.0–0.7)
Eosinophils Relative: 2.3 % (ref 0.0–5.0)
HCT: 45.8 % (ref 39.0–52.0)
HEMOGLOBIN: 15.4 g/dL (ref 13.0–17.0)
LYMPHS PCT: 25.3 % (ref 12.0–46.0)
Lymphs Abs: 2.6 10*3/uL (ref 0.7–4.0)
MCHC: 33.8 g/dL (ref 30.0–36.0)
MCV: 86.1 fl (ref 78.0–100.0)
Monocytes Absolute: 0.7 10*3/uL (ref 0.1–1.0)
Monocytes Relative: 6.5 % (ref 3.0–12.0)
NEUTROS PCT: 65.5 % (ref 43.0–77.0)
Neutro Abs: 6.7 10*3/uL (ref 1.4–7.7)
Platelets: 281 10*3/uL (ref 150.0–400.0)
RBC: 5.31 Mil/uL (ref 4.22–5.81)
RDW: 12.6 % (ref 11.5–15.5)
WBC: 10.3 10*3/uL (ref 4.0–10.5)

## 2014-10-28 LAB — URINALYSIS, ROUTINE W REFLEX MICROSCOPIC
Bilirubin Urine: NEGATIVE
Ketones, ur: NEGATIVE
Leukocytes, UA: NEGATIVE
Nitrite: NEGATIVE
Specific Gravity, Urine: >=1.03 — AB (ref 1.000–1.030)
TOTAL PROTEIN, URINE-UPE24: NEGATIVE
Urine Glucose: 500 — AB
Urobilinogen, UA: 0.2 (ref 0.0–1.0)
pH: 5.5 (ref 5.0–8.0)

## 2014-10-28 LAB — HEPATIC FUNCTION PANEL
ALT: 25 U/L (ref 0–53)
AST: 20 U/L (ref 0–37)
Albumin: 4.1 g/dL (ref 3.5–5.2)
Alkaline Phosphatase: 58 U/L (ref 39–117)
BILIRUBIN TOTAL: 0.4 mg/dL (ref 0.2–1.2)
Bilirubin, Direct: 0.1 mg/dL (ref 0.0–0.3)
Total Protein: 7.2 g/dL (ref 6.0–8.3)

## 2014-10-28 LAB — BASIC METABOLIC PANEL
BUN: 14 mg/dL (ref 6–23)
CALCIUM: 9.5 mg/dL (ref 8.4–10.5)
CHLORIDE: 102 meq/L (ref 96–112)
CO2: 28 meq/L (ref 19–32)
CREATININE: 0.9 mg/dL (ref 0.40–1.50)
GFR: 94.12 mL/min (ref >60.00)
Glucose, Bld: 141 mg/dL — ABNORMAL HIGH (ref 70–99)
Potassium: 4.4 mEq/L (ref 3.5–5.1)
Sodium: 137 mEq/L (ref 135–145)

## 2014-10-28 LAB — LIPID PANEL
Cholesterol: 162 mg/dL (ref 0–200)
HDL: 37.2 mg/dL — ABNORMAL LOW (ref >39.00)
LDL Cholesterol: 101 mg/dL — ABNORMAL HIGH (ref 0–99)
NonHDL: 124.8
TRIGLYCERIDES: 120 mg/dL (ref 0.0–149.0)
Total CHOL/HDL Ratio: 4
VLDL: 24 mg/dL (ref 0.0–40.0)

## 2014-10-28 LAB — MICROALBUMIN / CREATININE URINE RATIO
CREATININE, U: 216.5 mg/dL
MICROALB UR: 0.9 mg/dL (ref 0.0–1.9)
Microalb Creat Ratio: 0.4 mg/g (ref 0.0–30.0)

## 2014-10-28 LAB — TSH: TSH: 1.93 u[IU]/mL (ref 0.35–4.50)

## 2014-10-28 LAB — HEMOGLOBIN A1C: Hgb A1c MFr Bld: 7.1 % — ABNORMAL HIGH (ref 4.6–6.5)

## 2014-10-28 LAB — PSA: PSA: 0.35 ng/mL (ref 0.10–4.00)

## 2014-11-19 ENCOUNTER — Encounter: Payer: Self-pay | Admitting: Internal Medicine

## 2014-11-19 ENCOUNTER — Other Ambulatory Visit: Payer: Self-pay | Admitting: *Deleted

## 2014-11-19 MED ORDER — ROSUVASTATIN CALCIUM 20 MG PO TABS: 20.0000 mg | ORAL_TABLET | Freq: Every day | ORAL | Status: DC

## 2014-11-19 MED ORDER — LISINOPRIL 20 MG PO TABS: 20.0000 mg | ORAL_TABLET | Freq: Every day | ORAL | Status: DC

## 2014-11-19 NOTE — Telephone Encounter (Signed)
Patient would like someone to call him regarding his recent lab results. CB# [phone]

## 2014-11-19 NOTE — Telephone Encounter (Signed)
Pt called in to get refills on   Lisinopril  Rosuvastatin    Requesting [facility] [place]

## 2014-11-19 NOTE — Telephone Encounter (Signed)
Sent email needing refill on his lisinopril & crestor,, [facility].../lmb,/lmb

## 2015-01-13 ENCOUNTER — Ambulatory Visit (INDEPENDENT_AMBULATORY_CARE_PROVIDER_SITE_OTHER): Payer: PRIVATE HEALTH INSURANCE | Admitting: Family

## 2015-01-13 ENCOUNTER — Encounter: Payer: Self-pay | Admitting: Family

## 2015-01-13 VITALS — BP 118/60 | HR 100 | Temp 98.4°F | Resp 18 | Ht 71.5 in | Wt 337.8 lb

## 2015-01-13 DIAGNOSIS — R05 Cough: Secondary | ICD-10-CM

## 2015-01-13 DIAGNOSIS — R059 Cough, unspecified: Secondary | ICD-10-CM

## 2015-01-13 MED ORDER — CEFUROXIME AXETIL 250 MG PO TABS: 250.0000 mg | ORAL_TABLET | Freq: Two times a day (BID) | ORAL | Status: DC

## 2015-01-13 MED ORDER — HYDROCODONE-HOMATROPINE 5-1.5 MG/5ML PO SYRP: 5.0000 mL | ORAL_SOLUTION | Freq: Three times a day (TID) | ORAL | Status: DC | PRN

## 2015-01-13 NOTE — Progress Notes (Signed)
Pre visit review using our clinic review tool, if applicable. No additional management support is needed unless otherwise documented below in the visit note. 

## 2015-01-13 NOTE — Progress Notes (Signed)
   Subjective:    Patient ID: Martin Lozano, male    DOB: 03-05-1961, 54 y.o.   MRN: [id number]  Chief Complaint  Patient presents with  . Cough    x2 days, sore throat, congestion and cough    HPI:  Martin RossettiRonald B Fontanella is a 54 y.o. male who presents today for an acute visit.   This is a new problem. Associated symptoms of sore throat, cough, and congestion having going on for approximately 2 days. Has a history of seasonal allergies. Denies any modifying factors that make it better or worse. Denies any recent antibiotic use.   No Known Allergies   Current Outpatient Prescriptions on File Prior to Visit  Medication Sig Dispense Refill  . aspirin 81 MG tablet Take 81 mg by mouth daily.      Marland Kitchen. lisinopril (PRINIVIL,ZESTRIL) 20 MG tablet Take 1 tablet (20 mg total) by mouth daily. 90 tablet 3  . rosuvastatin (CRESTOR) 20 MG tablet Take 1 tablet (20 mg total) by mouth daily. 90 tablet 3   No current facility-administered medications on file prior to visit.    Review of Systems  Constitutional: Negative for fever and chills.  HENT: Positive for congestion, sinus pressure and sore throat.   Respiratory: Positive for cough. Negative for chest tightness and shortness of breath.   Neurological: Negative for headaches.      Objective:    BP 118/60 mmHg  Pulse 100  Temp(Src) 98.4 F (36.9 C) (Oral)  Resp 18  Ht 5' 11.5" (1.816 m)  Wt 337 lb 12.8 oz (153.225 kg)  BMI 46.46 kg/m2  SpO2 96% Nursing note and vital signs reviewed.  Physical Exam  Constitutional: He is oriented to person, place, and time. He appears well-developed and well-nourished. No distress.  Obese male seated in the chair; dressed appropriately and appears his stated age.   HENT:  Right Ear: Hearing, tympanic membrane, external ear and ear canal normal.  Left Ear: Hearing, tympanic membrane, external ear and ear canal normal.  Nose: Nose normal. Right sinus exhibits no maxillary sinus tenderness and no  frontal sinus tenderness. Left sinus exhibits no maxillary sinus tenderness and no frontal sinus tenderness.  Mouth/Throat: Uvula is midline, oropharynx is clear and moist and mucous membranes are normal.  Cardiovascular: Normal rate, regular rhythm, normal heart sounds and intact distal pulses.   Pulmonary/Chest: Effort normal and breath sounds normal.  Neurological: He is alert and oriented to person, place, and time.  Skin: Skin is warm and dry.  Psychiatric: He has a normal mood and affect. His behavior is normal. Judgment and thought content normal.       Assessment & Plan:

## 2015-01-13 NOTE — Assessment & Plan Note (Signed)
Symptoms and exam consistent with viral upper respiratory infection. Continue over-the-counter medications as needed for symptom relief and supportive care.*Hycodan as needed for cough and sleep. Patient provided prescription for Ceftin if symptoms worsen or fail to improve in the next 3-5 days. Follow-up if symptoms worsen or fail to improve.

## 2015-01-13 NOTE — Patient Instructions (Signed)
Thank you for choosing [facility].  Summary/Instructions:  Your prescription(s) have been submitted to your pharmacy or been printed and provided for you. Please take as directed and contact our office if you believe you are having problem(s) with the medication(s) or have any questions.  If your symptoms worsen or fail to improve, please contact our office for further instruction, or in case of emergency go directly to the emergency room at the closest medical facility.   General Recommendations:    Please drink plenty of fluids.  Get plenty of rest   Sleep in humidified air  Use saline nasal sprays  Netti pot   OTC Medications:  Decongestants - helps relieve congestion   Flonase (generic fluticasone) or Nasacort (generic triamcinolone) - please make sure to use the "cross-over" technique at a 45 degree angle towards the opposite eye as opposed to straight up the nasal passageway.   Sudafed (generic pseudoephedrine - Note this is the one that is available behind the pharmacy counter); Products with phenylephrine (-PE) may also be used but is often not as effective as pseudoephedrine.   If you have HIGH BLOOD PRESSURE - Coricidin HBP; AVOID any product that is -D as this contains pseudoephedrine which may increase your blood pressure.  Afrin (oxymetazoline) every 6-8 hours for up to 3 days.   Allergies - helps relieve runny nose, itchy eyes and sneezing   Claritin (generic loratidine), Allegra (fexofenidine), or Zyrtec (generic cyrterizine) for runny nose. These medications should not cause drowsiness.  Note - Benadryl (generic diphenhydramine) may be used however may cause drowsiness  Cough -   Delsym or Robitussin (generic dextromethorphan)  Expectorants - helps loosen mucus to ease removal   Mucinex (generic guaifenesin) as directed on the package.  Headaches / General Aches   Tylenol (generic acetaminophen) - DO NOT EXCEED 3 grams (3,000 mg) in a 24  hour time period  Advil/Motrin (generic ibuprofen)   Sore Throat -   Salt water gargle   Chloraseptic (generic benzocaine) spray or lozenges / Sucrets (generic dyclonine)      

## 2015-01-20 ENCOUNTER — Telehealth: Payer: Self-pay | Admitting: Internal Medicine

## 2015-01-20 ENCOUNTER — Other Ambulatory Visit: Payer: Self-pay | Admitting: Family

## 2015-01-20 MED ORDER — HYDROCODONE-HOMATROPINE 5-1.5 MG/5ML PO SYRP: 5.0000 mL | ORAL_SOLUTION | Freq: Three times a day (TID) | ORAL | Status: DC | PRN

## 2015-01-20 NOTE — Telephone Encounter (Signed)
LVM making pt aware

## 2015-01-20 NOTE — Telephone Encounter (Signed)
Pt has an appt with Dr Jonny Ruizjohn tomorrow but wants to know if he can get a refill on his cough meds today.  He said that he has not slept in 48 hours

## 2015-01-20 NOTE — Telephone Encounter (Signed)
Medication refilled

## 2015-01-21 ENCOUNTER — Encounter: Payer: Self-pay | Admitting: Internal Medicine

## 2015-01-21 ENCOUNTER — Ambulatory Visit (INDEPENDENT_AMBULATORY_CARE_PROVIDER_SITE_OTHER): Payer: PRIVATE HEALTH INSURANCE | Admitting: Internal Medicine

## 2015-01-21 VITALS — BP 120/74 | HR 71 | Temp 98.9°F | Resp 18 | Ht 71.5 in | Wt 335.0 lb

## 2015-01-21 DIAGNOSIS — R0609 Other forms of dyspnea: Secondary | ICD-10-CM | POA: Diagnosis not present

## 2015-01-21 DIAGNOSIS — I10 Essential (primary) hypertension: Secondary | ICD-10-CM

## 2015-01-21 DIAGNOSIS — R059 Cough, unspecified: Secondary | ICD-10-CM

## 2015-01-21 DIAGNOSIS — J309 Allergic rhinitis, unspecified: Secondary | ICD-10-CM

## 2015-01-21 DIAGNOSIS — R05 Cough: Secondary | ICD-10-CM | POA: Diagnosis not present

## 2015-01-21 MED ORDER — TRIAMCINOLONE ACETONIDE 55 MCG/ACT NA AERO: 2.0000 | INHALATION_SPRAY | Freq: Every day | NASAL | Status: DC

## 2015-01-21 MED ORDER — PREDNISONE 10 MG PO TABS: ORAL_TABLET | ORAL | Status: DC

## 2015-01-21 MED ORDER — CETIRIZINE HCL 10 MG PO TABS: 10.0000 mg | ORAL_TABLET | Freq: Every day | ORAL | Status: DC

## 2015-01-21 MED ORDER — ALBUTEROL SULFATE HFA 108 (90 BASE) MCG/ACT IN AERS: 2.0000 | INHALATION_SPRAY | Freq: Four times a day (QID) | RESPIRATORY_TRACT | Status: DC | PRN

## 2015-01-21 MED ORDER — METHYLPREDNISOLONE ACETATE 80 MG/ML IJ SUSP
80.0000 mg | Freq: Once | INTRAMUSCULAR | Status: AC
  Administered 2015-01-21: 80 mg via INTRAMUSCULAR

## 2015-01-21 NOTE — Progress Notes (Signed)
Subjective:    Patient ID: Martin Lozano, male    DOB: Dec 17, 1960, 54 y.o.   MRN: [id number]  HPI  Here to f/u, seen per NP, started 1 wk ago, but unfort cough persisted, in fact worse at night, hard to sleep, even with cough medicine.  No fever, but has congestion. Does not feel, but uncomfortable.  Does have several wks ongoing nasal allergy symptoms with clearish congestion, itch and sneezing, without fever, pain, ST, cough, swelling or wheezing.  Pt denies chest pain,  wheezing, orthopnea, PND, increased LE swelling, palpitations, dizziness or syncope. But has had signficant sob/doe out of proportion to unusual with hiking the past weekend Past Medical History  Diagnosis Date  . ALLERGIC RHINITIS 09/06/2007  . ASTHMATIC BRONCHITIS, ACUTE 11/16/2007  . HEMORRHOIDS 07/03/2007  . HYPERLIPIDEMIA 09/06/2007  . HYPERTENSION 07/03/2007  . KNEE SPRAIN, LEFT 07/30/2008  . OBESITY, MORBID 07/03/2007  . Other tenosynovitis of hand and wrist 06/11/2010  . OTITIS MEDIA, ACUTE, LEFT 11/10/2009  . Wheezing 09/09/2009  . Type II or unspecified type diabetes mellitus without mention of complication, uncontrolled 09/25/2012   No past surgical history on file.  reports that he has never smoked. He does not have any smokeless tobacco history on file. He reports that he does not drink alcohol or use illicit drugs. family history is not on file. No Known Allergies Current Outpatient Prescriptions on File Prior to Visit  Medication Sig Dispense Refill  . aspirin 81 MG tablet Take 81 mg by mouth daily.      . cefUROXime (CEFTIN) 250 MG tablet Take 1 tablet (250 mg total) by mouth 2 (two) times daily with a meal. 20 tablet 0  . HYDROcodone-homatropine (HYCODAN) 5-1.5 MG/5ML syrup Take 5 mLs by mouth every 8 (eight) hours as needed for cough. 120 mL 0  . lisinopril (PRINIVIL,ZESTRIL) 20 MG tablet Take 1 tablet (20 mg total) by mouth daily. 90 tablet 3  . rosuvastatin (CRESTOR) 20 MG tablet Take 1 tablet (20 mg  total) by mouth daily. 90 tablet 3   No current facility-administered medications on file prior to visit.    Review of Systems  Constitutional: Negative for unusual diaphoresis or night sweats HENT: Negative for ringing in ear or discharge Eyes: Negative for double vision or worsening visual disturbance.  Respiratory: Negative for choking and stridor.   Gastrointestinal: Negative for vomiting or other signifcant bowel change Genitourinary: Negative for hematuria or change in urine volume.  Musculoskeletal: Negative for other MSK pain or swelling Skin: Negative for color change and worsening wound.  Neurological: Negative for tremors and numbness other than noted  Psychiatric/Behavioral: Negative for decreased concentration or agitation other than above       Objective:   Physical Exam BP 120/74 mmHg  Pulse 71  Temp(Src) 98.9 F (37.2 C) (Oral)  Resp 18  Ht 5' 11.5" (1.816 m)  Wt 335 lb (151.955 kg)  BMI 46.08 kg/m2  SpO2 97% VS noted, not ill appearing Constitutional: Pt appears in no significant distress HENT: Head: NCAT.  Right Ear: External ear normal.  Left Ear: External ear normal.  Bilat tm's with mild erythema.  Max sinus areas non tender.  Pharynx with mild erythema, no exudate Eyes: . Pupils are equal, round, and reactive to light. Conjunctivae and EOM are normal Neck: Normal range of motion. Neck supple.  Cardiovascular: Normal rate and regular rhythm.   Pulmonary/Chest: Effort normal and breath sounds decreased without rales or frank wheezing Neurological: Pt is alert.  Not confused , motor grossly intact Skin: Skin is warm. No rash, no LE edema Psychiatric: Pt behavior is normal. No agitation.      Assessment & Plan:

## 2015-01-21 NOTE — Patient Instructions (Addendum)
You had the steroid shot today  Please take all new medication as prescribed - the prednisone, zyrtec and nasacort for allergies and ? Asthma  Please take all new medication as prescribed - also the inhaler if needed for shortness or breath or wheezing  Please continue all other medications as before, and refills have been done if requested.  Please have the pharmacy call with any other refills you may need.  Please keep your appointments with your specialists as you may have planned  Please go to the XRAY Department in the Basement (go straight as you get off the elevator) for the x-ray testing tomorrow  You will be contacted by phone if any changes need to be made immediately.  Otherwise, you will receive a letter about your results with an explanation, but please check with MyChart first.  Please remember to sign up for MyChart if you have not done so, as this will be important to you in the future with finding out test results, communicating by private email, and scheduling acute appointments online when needed.

## 2015-01-21 NOTE — Progress Notes (Signed)
Pre visit review using our clinic review tool, if applicable. No additional management support is needed unless otherwise documented below in the visit note. 

## 2015-01-22 ENCOUNTER — Ambulatory Visit (INDEPENDENT_AMBULATORY_CARE_PROVIDER_SITE_OTHER)
Admission: RE | Admit: 2015-01-22 | Discharge: 2015-01-22 | Disposition: A | Discharge status: Home / Self Care | Payer: PRIVATE HEALTH INSURANCE | Source: Ambulatory Visit | Attending: Internal Medicine | Admitting: Internal Medicine

## 2015-01-22 DIAGNOSIS — R05 Cough: Secondary | ICD-10-CM | POA: Diagnosis not present

## 2015-01-22 DIAGNOSIS — J309 Allergic rhinitis, unspecified: Secondary | ICD-10-CM | POA: Diagnosis not present

## 2015-01-22 DIAGNOSIS — R0609 Other forms of dyspnea: Secondary | ICD-10-CM | POA: Diagnosis not present

## 2015-01-22 DIAGNOSIS — R059 Cough, unspecified: Secondary | ICD-10-CM

## 2015-02-01 NOTE — Assessment & Plan Note (Signed)
With seasonal flare - for depomeddrol IM, predpac asd, zyrtec/flonase asd, to f/u any worsening symptoms or concerns

## 2015-02-01 NOTE — Assessment & Plan Note (Signed)
stable overall by history and exam, recent data reviewed with pt, and pt to continue medical treatment as before,  to f/u any worsening symptoms or concerns BP Readings from Last 3 Encounters:  01/21/15 120/74  01/13/15 118/60  10/22/14 138/82

## 2015-02-01 NOTE — Assessment & Plan Note (Signed)
?   Atypical asthma vs other, for cxr, trial MDI, steroid tx as above,  to f/u any worsening symptoms or concerns

## 2015-02-01 NOTE — Assessment & Plan Note (Signed)
Also for MDI inhaler prn,  to f/u any worsening symptoms or concerns

## 2015-03-08 ENCOUNTER — Encounter: Payer: Self-pay | Admitting: Family Medicine

## 2015-03-08 ENCOUNTER — Ambulatory Visit (INDEPENDENT_AMBULATORY_CARE_PROVIDER_SITE_OTHER): Payer: PRIVATE HEALTH INSURANCE | Admitting: Family Medicine

## 2015-03-08 VITALS — BP 118/78 | HR 78 | Temp 98.4°F | Wt 338.5 lb

## 2015-03-08 DIAGNOSIS — J0100 Acute maxillary sinusitis, unspecified: Secondary | ICD-10-CM | POA: Diagnosis not present

## 2015-03-08 MED ORDER — HYDROCODONE-HOMATROPINE 5-1.5 MG/5ML PO SYRP: 5.0000 mL | ORAL_SOLUTION | ORAL | Status: DC | PRN

## 2015-03-08 MED ORDER — AZITHROMYCIN 250 MG PO TABS: ORAL_TABLET | ORAL | Status: DC

## 2015-03-08 MED ORDER — METHYLPREDNISOLONE 4 MG PO TBPK
ORAL_TABLET | ORAL | Status: DC
Start: 2015-03-08 — End: 2015-06-24

## 2015-03-08 NOTE — Progress Notes (Signed)
   Subjective:    Patient ID: Vivien RossettiRonald B Swartzendruber, male    DOB: June 29, 1961, 54 y.o.   MRN: [id number]  HPI Here for 5 days of sinus pressure, HA, PND, and coughing up green sputum. On Claritin and Flonase.    Review of Systems  Constitutional: Negative.   HENT: Positive for congestion, postnasal drip and sinus pressure. Negative for sore throat.   Eyes: Negative.   Respiratory: Positive for cough and chest tightness.        Objective:   Physical Exam  Constitutional: He appears well-developed and well-nourished.  HENT:  Right Ear: External ear normal.  Left Ear: External ear normal.  Nose: Nose normal.  Mouth/Throat: Oropharynx is clear and moist.  Eyes: Conjunctivae are normal.  Pulmonary/Chest: Effort normal and breath sounds normal. No respiratory distress. He has no wheezes. He has no rales.  Lymphadenopathy:    He has no cervical adenopathy.          Assessment & Plan:  Sinusitis, treat with a Zpack and a Medrol dose pack.

## 2015-03-08 NOTE — Progress Notes (Signed)
Pre visit review using our clinic review tool, if applicable. No additional management support is needed unless otherwise documented below in the visit note. 

## 2015-06-10 ENCOUNTER — Encounter: Payer: Self-pay | Admitting: Internal Medicine

## 2015-06-13 MED ORDER — ALPRAZOLAM 0.5 MG PO TABS: 0.5000 mg | ORAL_TABLET | Freq: Two times a day (BID) | ORAL | Status: DC | PRN

## 2015-06-13 NOTE — Telephone Encounter (Signed)
Done hardcopy to Dahlia  

## 2015-06-13 NOTE — Telephone Encounter (Signed)
Patient called stating he has not received a response from his MyChart msg sent on 06/11/15. He is flying out early Monday morning and states this is now an urgent matter. CB# [phone]

## 2015-06-24 ENCOUNTER — Ambulatory Visit (INDEPENDENT_AMBULATORY_CARE_PROVIDER_SITE_OTHER): Payer: PRIVATE HEALTH INSURANCE | Admitting: Internal Medicine

## 2015-06-24 ENCOUNTER — Encounter: Payer: Self-pay | Admitting: Internal Medicine

## 2015-06-24 VITALS — BP 118/68 | HR 75 | Temp 97.9°F | Resp 18 | Ht 71.5 in | Wt 337.2 lb

## 2015-06-24 DIAGNOSIS — J209 Acute bronchitis, unspecified: Secondary | ICD-10-CM

## 2015-06-24 DIAGNOSIS — J0100 Acute maxillary sinusitis, unspecified: Secondary | ICD-10-CM | POA: Diagnosis not present

## 2015-06-24 MED ORDER — AMOXICILLIN 500 MG PO CAPS: 500.0000 mg | ORAL_CAPSULE | Freq: Three times a day (TID) | ORAL | Status: DC

## 2015-06-24 MED ORDER — HYDROCODONE-HOMATROPINE 5-1.5 MG/5ML PO SYRP: 5.0000 mL | ORAL_SOLUTION | Freq: Four times a day (QID) | ORAL | Status: DC | PRN

## 2015-06-24 NOTE — Progress Notes (Signed)
   Subjective:    Patient ID: Martin Lozano, male    DOB: 1961/02/11, 54 y.o.   MRN: [id number]  HPI His symptoms began 06/21/15 as cough and sore throat. This progressed to a cough productive of green/yellow secretions. He also had some green-yellow nasal secretions. The volume from the chest was greater than the volume of nasal secretions. He had been flying going to and from [facility] a week before.  With the chest congestion he's had some wheezing. He also describes maxillary sinus pressure. He has itchy, watery eyes. This is in the context of history of seasonal allergies.  He's taken Robitussin up for tablespoons at a time.  He quit smoking 22 years ago. He does not have a history of asthma.  Review of Systems  He has no fever, chills, or sweats. There is no frontal sinus pain. He has no otic pain, dental pain, halitosis, or otic discharge. There is no shortness of breath with the cough. He also denies paroxysmal nocturnal dyspnea.      Objective:   Physical Exam  General appearance:Adequately nourished; no acute distress or increased work of breathing is present.  BMI 46.39  Lymphatic: No  lymphadenopathy about the head, neck, or axilla .  Eyes: No conjunctival inflammation or lid edema is present. There is no scleral icterus.  Ears:  External ear exam shows no significant lesions or deformities.  Otoscopic examination reveals clear canals, tympanic membranes are intact bilaterally without bulging, retraction, inflammation or discharge.  Nose:  External nasal examination shows no deformity or inflammation. Nasal mucosa are markedly erythematous without lesions or exudates No septal dislocation or deviation.No obstruction to airflow.   Oral exam: Dental hygiene is good; lips and gums are healthy appearing.There is no oropharyngeal erythema or exudate .  Neck:  No deformities, thyromegaly, masses, or tenderness noted.   Supple with full range of motion without pain.   Heart:   Normal rate and regular rhythm. S1 and S2 normal without gallop, murmur, click, rub or other extra sounds.   Lungs:Chest clear to auscultation; no wheezes, rhonchi,rales ,or rubs present.  Extremities:  No cyanosis, edema, or clubbing  noted    Skin: Warm & dry w/o tenting or jaundice. No significant lesions or rash.        Assessment & Plan:  #1 acute bronchitis w/o bronchospasm #2 URI, acute vs maxillary sinusitis Plan: See orders and recommendations

## 2015-06-24 NOTE — Progress Notes (Signed)
Pre visit review using our clinic review tool, if applicable. No additional management support is needed unless otherwise documented below in the visit note. 

## 2015-06-24 NOTE — Patient Instructions (Signed)
Plain Mucinex (NOT D) for thick secretions ;force NON dairy fluids .   Nasal cleansing in the shower as discussed with lather of mild shampoo.After 10 seconds wash off lather while  exhaling through nostrils. Make sure that all residual soap is removed to prevent irritation.  Flonase OR Nasacort AQ 1 spray in each nostril twice a day as needed. Use the "crossover" technique into opposite nostril spraying toward opposite ear @ 45 degree angle, not straight up into nostril.  Plain Allegra (NOT D )  160 daily , Loratidine 10 mg , OR Zyrtec 10 mg @ bedtime  as needed for itchy eyes & sneezing.  To use Breo: Pull cap down to release medication. Blow out as much as possible then inhale powder as deeply as possible. Hold breath to count of ten then exhale.  Gargle and spit after use. Lot #:  [id number] Expiration date: 4/18

## 2015-06-25 ENCOUNTER — Ambulatory Visit: Payer: PRIVATE HEALTH INSURANCE | Admitting: Internal Medicine

## 2015-09-09 ENCOUNTER — Ambulatory Visit (INDEPENDENT_AMBULATORY_CARE_PROVIDER_SITE_OTHER): Payer: PRIVATE HEALTH INSURANCE | Admitting: Internal Medicine

## 2015-09-09 ENCOUNTER — Encounter: Payer: Self-pay | Admitting: Internal Medicine

## 2015-09-09 VITALS — BP 110/70 | HR 77 | Temp 98.6°F | Resp 16 | Ht 71.5 in | Wt 336.0 lb

## 2015-09-09 DIAGNOSIS — J209 Acute bronchitis, unspecified: Secondary | ICD-10-CM

## 2015-09-09 MED ORDER — MOXIFLOXACIN HCL 400 MG PO TABS: 400.0000 mg | ORAL_TABLET | Freq: Every day | ORAL | Status: AC

## 2015-09-09 MED ORDER — HYDROCODONE-HOMATROPINE 5-1.5 MG/5ML PO SYRP: 5.0000 mL | ORAL_SOLUTION | Freq: Three times a day (TID) | ORAL | Status: DC | PRN

## 2015-09-09 NOTE — Patient Instructions (Signed)

## 2015-09-10 NOTE — Progress Notes (Signed)
Subjective:  Patient ID: Martin Lozano, male    DOB: 05-17-1961  Age: 54 y.o. MRN: [id number]  CC: Cough   HPI Martin Lozano presents for  A 4 day history of cough productive of green phlegm with chills but no fever.  Outpatient Prescriptions Prior to Visit  Medication Sig Dispense Refill  . aspirin 81 MG tablet Take 81 mg by mouth daily.      Marland Kitchen. lisinopril (PRINIVIL,ZESTRIL) 20 MG tablet Take 1 tablet (20 mg total) by mouth daily. 90 tablet 3  . rosuvastatin (CRESTOR) 20 MG tablet Take 1 tablet (20 mg total) by mouth daily. 90 tablet 3  . ALPRAZolam (XANAX) 0.5 MG tablet Take 1 tablet (0.5 mg total) by mouth 2 (two) times daily as needed for anxiety. 10 tablet 0  . amoxicillin (AMOXIL) 500 MG capsule Take 1 capsule (500 mg total) by mouth 3 (three) times daily. 30 capsule 0  . HYDROcodone-homatropine (HYDROMET) 5-1.5 MG/5ML syrup Take 5 mLs by mouth every 6 (six) hours as needed for cough. 120 mL 0   No facility-administered medications prior to visit.    ROS Review of Systems  Constitutional: Positive for chills. Negative for fever, diaphoresis, activity change, appetite change and fatigue.  HENT: Positive for sore throat. Negative for congestion, facial swelling, sinus pressure, sneezing, trouble swallowing and voice change.   Eyes: Negative.   Respiratory: Positive for cough. Negative for apnea, choking, chest tightness, shortness of breath, wheezing and stridor.   Cardiovascular: Negative.  Negative for chest pain, palpitations and leg swelling.  Gastrointestinal: Negative.  Negative for nausea, abdominal pain and diarrhea.  Endocrine: Negative.   Genitourinary: Negative.   Musculoskeletal: Negative.  Negative for myalgias and back pain.  Skin: Negative.  Negative for color change and rash.  Allergic/Immunologic: Negative.   Neurological: Negative.   Hematological: Negative.  Negative for adenopathy. Does not bruise/bleed easily.  Psychiatric/Behavioral: Negative.      Objective:  BP 110/70 mmHg  Pulse 77  Temp(Src) 98.6 F (37 C) (Oral)  Resp 16  Ht 5' 11.5" (1.816 m)  Wt 336 lb (152.409 kg)  BMI 46.21 kg/m2  SpO2 95%  BP Readings from Last 3 Encounters:  09/09/15 110/70  06/24/15 118/68  03/08/15 118/78    Wt Readings from Last 3 Encounters:  09/09/15 336 lb (152.409 kg)  06/24/15 337 lb 4 oz (152.976 kg)  03/08/15 338 lb 8 oz (153.543 kg)    Physical Exam  Constitutional: He is oriented to person, place, and time.  Non-toxic appearance. He does not have a sickly appearance. He does not appear ill. No distress.  HENT:  Mouth/Throat: Oropharynx is clear and moist. No oropharyngeal exudate.  Eyes: Conjunctivae are normal. Right eye exhibits no discharge. Left eye exhibits no discharge. No scleral icterus.  Neck: Normal range of motion. Neck supple. No JVD present. No tracheal deviation present. No thyromegaly present.  Cardiovascular: Normal rate, regular rhythm, normal heart sounds and intact distal pulses.  Exam reveals no gallop and no friction rub.   No murmur heard. Pulmonary/Chest: Effort normal and breath sounds normal. No stridor. No respiratory distress. He has no wheezes. He has no rales. He exhibits no tenderness.  Abdominal: Soft. Bowel sounds are normal. He exhibits no distension and no mass. There is no tenderness. There is no rebound and no guarding.  Musculoskeletal: Normal range of motion. He exhibits no edema or tenderness.  Lymphadenopathy:    He has no cervical adenopathy.  Neurological: He is oriented  to person, place, and time.  Skin: Skin is warm and dry. No rash noted. He is not diaphoretic. No erythema. No pallor.  Psychiatric: He has a normal mood and affect. His behavior is normal. Judgment and thought content normal.  Vitals reviewed.   Lab Results  Component Value Date   WBC 10.3 10/28/2014   HGB 15.4 10/28/2014   HCT 45.8 10/28/2014   PLT 281.0 10/28/2014   GLUCOSE 141* 10/28/2014   CHOL 162  10/28/2014   TRIG 120.0 10/28/2014   HDL 37.20* 10/28/2014   LDLDIRECT 86.5 11/04/2011   LDLCALC 101* 10/28/2014   ALT 25 10/28/2014   AST 20 10/28/2014   NA 137 10/28/2014   K 4.4 10/28/2014   CL 102 10/28/2014   CREATININE 0.90 10/28/2014   BUN 14 10/28/2014   CO2 28 10/28/2014   TSH 1.93 10/28/2014   PSA 0.35 10/28/2014   HGBA1C 7.1* 10/28/2014   MICROALBUR 0.9 10/28/2014    Dg Chest 2 View  01/22/2015  CLINICAL DATA:  Five days of dyspnea on exertion, 7 days of cough, history of bronchitis and pneumonia. EXAM: CHEST  2 VIEW COMPARISON:  Portable chest x-ray of June 23, 2008 FINDINGS: The lungs are adequately inflated and clear. The heart and pulmonary vascularity are normal. The mediastinum is normal in width. There is no pleural effusion. The bony thorax is unremarkable. IMPRESSION: There is no pneumonia nor other active cardiopulmonary disease. Electronically Signed   By: David  [place] M.D.   On: 01/22/2015 11:53    Assessment & Plan:   Martin Lozano was seen today for cough.  Diagnoses and all orders for this visit:  Acute bronchitis, unspecified organism-  I will treat the infection with Avelox and will control his cough with Hycodan. -     moxifloxacin (AVELOX) 400 MG tablet; Take 1 tablet (400 mg total) by mouth daily. -     HYDROcodone-homatropine (HYCODAN) 5-1.5 MG/5ML syrup; Take 5 mLs by mouth every 8 (eight) hours as needed for cough.   I have discontinued [name]'s ALPRAZolam, amoxicillin, and HYDROcodone-homatropine. I am also having him start on moxifloxacin and HYDROcodone-homatropine. Additionally, I am having him maintain his aspirin, rosuvastatin, and lisinopril.  Meds ordered this encounter  Medications  . moxifloxacin (AVELOX) 400 MG tablet    Sig: Take 1 tablet (400 mg total) by mouth daily.    Dispense:  7 tablet    Refill:  0  . HYDROcodone-homatropine (HYCODAN) 5-1.5 MG/5ML syrup    Sig: Take 5 mLs by mouth every 8 (eight) hours as needed for  cough.    Dispense:  120 mL    Refill:  0     Follow-up: Return in about 3 weeks (around 09/30/2015).  Sanda Linger, MD

## 2015-11-28 ENCOUNTER — Other Ambulatory Visit: Payer: Self-pay | Admitting: Internal Medicine

## 2016-01-09 ENCOUNTER — Other Ambulatory Visit: Payer: PRIVATE HEALTH INSURANCE

## 2016-01-12 ENCOUNTER — Telehealth: Payer: Self-pay | Admitting: Internal Medicine

## 2016-01-12 DIAGNOSIS — Z125 Encounter for screening for malignant neoplasm of prostate: Secondary | ICD-10-CM

## 2016-01-12 DIAGNOSIS — Z0000 Encounter for general adult medical examination without abnormal findings: Secondary | ICD-10-CM

## 2016-01-12 NOTE — Telephone Encounter (Signed)
Called pt no answer LMOM lab order has been entered...Raechel Chute/lmb

## 2016-01-12 NOTE — Telephone Encounter (Signed)
4.10.17 Pt is requesting lab work orders be put in before his CPE on 01/15/16 at 2:30. If the orders can or can't be put in, please call pt and let them know. Thanks! MS

## 2016-01-13 ENCOUNTER — Other Ambulatory Visit (INDEPENDENT_AMBULATORY_CARE_PROVIDER_SITE_OTHER): Payer: PRIVATE HEALTH INSURANCE

## 2016-01-13 DIAGNOSIS — Z125 Encounter for screening for malignant neoplasm of prostate: Secondary | ICD-10-CM

## 2016-01-13 DIAGNOSIS — Z0000 Encounter for general adult medical examination without abnormal findings: Secondary | ICD-10-CM

## 2016-01-13 LAB — LIPID PANEL
CHOL/HDL RATIO: 4
Cholesterol: 131 mg/dL (ref 0–200)
HDL: 31.1 mg/dL — AB (ref >39.00)
LDL CALC: 78 mg/dL (ref 0–99)
NonHDL: 99.64
TRIGLYCERIDES: 107 mg/dL (ref 0.0–149.0)
VLDL: 21.4 mg/dL (ref 0.0–40.0)

## 2016-01-13 LAB — CBC WITH DIFFERENTIAL/PLATELET
BASOS PCT: 0.3 % (ref 0.0–3.0)
Basophils Absolute: 0 10*3/uL (ref 0.0–0.1)
EOS ABS: 0.2 10*3/uL (ref 0.0–0.7)
Eosinophils Relative: 2.1 % (ref 0.0–5.0)
HCT: 43.4 % (ref 39.0–52.0)
HEMOGLOBIN: 14.9 g/dL (ref 13.0–17.0)
LYMPHS ABS: 2.6 10*3/uL (ref 0.7–4.0)
Lymphocytes Relative: 24 % (ref 12.0–46.0)
MCHC: 34.2 g/dL (ref 30.0–36.0)
MCV: 84.7 fl (ref 78.0–100.0)
MONO ABS: 0.6 10*3/uL (ref 0.1–1.0)
Monocytes Relative: 5.7 % (ref 3.0–12.0)
NEUTROS PCT: 67.9 % (ref 43.0–77.0)
Neutro Abs: 7.3 10*3/uL (ref 1.4–7.7)
PLATELETS: 264 10*3/uL (ref 150.0–400.0)
RBC: 5.13 Mil/uL (ref 4.22–5.81)
RDW: 12.9 % (ref 11.5–15.5)
WBC: 10.7 10*3/uL — AB (ref 4.0–10.5)

## 2016-01-13 LAB — HEPATIC FUNCTION PANEL
ALBUMIN: 4.2 g/dL (ref 3.5–5.2)
ALT: 17 U/L (ref 0–53)
AST: 15 U/L (ref 0–37)
Alkaline Phosphatase: 66 U/L (ref 39–117)
Bilirubin, Direct: 0.1 mg/dL (ref 0.0–0.3)
TOTAL PROTEIN: 7 g/dL (ref 6.0–8.3)
Total Bilirubin: 0.6 mg/dL (ref 0.2–1.2)

## 2016-01-13 LAB — PSA: PSA: 0.34 ng/mL (ref 0.10–4.00)

## 2016-01-13 LAB — URINALYSIS, ROUTINE W REFLEX MICROSCOPIC
Bilirubin Urine: NEGATIVE
KETONES UR: NEGATIVE
Leukocytes, UA: NEGATIVE
Nitrite: NEGATIVE
PH: 5.5 (ref 5.0–8.0)
SPECIFIC GRAVITY, URINE: 1.025 (ref 1.000–1.030)
TOTAL PROTEIN, URINE-UPE24: NEGATIVE
URINE GLUCOSE: 250 — AB
UROBILINOGEN UA: 0.2 (ref 0.0–1.0)

## 2016-01-13 LAB — TSH: TSH: 1.11 u[IU]/mL (ref 0.35–4.50)

## 2016-01-13 LAB — BASIC METABOLIC PANEL
BUN: 13 mg/dL (ref 6–23)
CHLORIDE: 100 meq/L (ref 96–112)
CO2: 28 mEq/L (ref 19–32)
CREATININE: 1.03 mg/dL (ref 0.40–1.50)
Calcium: 9.5 mg/dL (ref 8.4–10.5)
GFR: 80.18 mL/min (ref >60.00)
GLUCOSE: 167 mg/dL — AB (ref 70–99)
Potassium: 4.6 mEq/L (ref 3.5–5.1)
Sodium: 137 mEq/L (ref 135–145)

## 2016-01-14 ENCOUNTER — Encounter: Payer: PRIVATE HEALTH INSURANCE | Admitting: Internal Medicine

## 2016-01-15 ENCOUNTER — Other Ambulatory Visit (INDEPENDENT_AMBULATORY_CARE_PROVIDER_SITE_OTHER): Payer: PRIVATE HEALTH INSURANCE

## 2016-01-15 ENCOUNTER — Ambulatory Visit (INDEPENDENT_AMBULATORY_CARE_PROVIDER_SITE_OTHER): Payer: PRIVATE HEALTH INSURANCE | Admitting: Internal Medicine

## 2016-01-15 ENCOUNTER — Encounter: Payer: Self-pay | Admitting: Internal Medicine

## 2016-01-15 VITALS — BP 142/64 | HR 104 | Temp 98.4°F | Resp 18 | Wt 336.0 lb

## 2016-01-15 DIAGNOSIS — Z1159 Encounter for screening for other viral diseases: Secondary | ICD-10-CM

## 2016-01-15 DIAGNOSIS — I10 Essential (primary) hypertension: Secondary | ICD-10-CM | POA: Diagnosis not present

## 2016-01-15 DIAGNOSIS — R739 Hyperglycemia, unspecified: Secondary | ICD-10-CM

## 2016-01-15 DIAGNOSIS — E785 Hyperlipidemia, unspecified: Secondary | ICD-10-CM | POA: Diagnosis not present

## 2016-01-15 DIAGNOSIS — R6889 Other general symptoms and signs: Secondary | ICD-10-CM

## 2016-01-15 DIAGNOSIS — Z0001 Encounter for general adult medical examination with abnormal findings: Secondary | ICD-10-CM

## 2016-01-15 DIAGNOSIS — Z0000 Encounter for general adult medical examination without abnormal findings: Secondary | ICD-10-CM

## 2016-01-15 LAB — HEPATIC FUNCTION PANEL
ALK PHOS: 71 U/L (ref 39–117)
ALT: 17 U/L (ref 0–53)
AST: 14 U/L (ref 0–37)
Albumin: 4 g/dL (ref 3.5–5.2)
BILIRUBIN DIRECT: 0.1 mg/dL (ref 0.0–0.3)
BILIRUBIN TOTAL: 0.4 mg/dL (ref 0.2–1.2)
TOTAL PROTEIN: 7 g/dL (ref 6.0–8.3)

## 2016-01-15 LAB — LIPID PANEL
CHOLESTEROL: 138 mg/dL (ref 0–200)
HDL: 29.4 mg/dL — ABNORMAL LOW (ref >39.00)
LDL CALC: 79 mg/dL (ref 0–99)
NONHDL: 109.01
Total CHOL/HDL Ratio: 5
Triglycerides: 148 mg/dL (ref 0.0–149.0)
VLDL: 29.6 mg/dL (ref 0.0–40.0)

## 2016-01-15 LAB — BASIC METABOLIC PANEL
BUN: 14 mg/dL (ref 6–23)
CALCIUM: 9.1 mg/dL (ref 8.4–10.5)
CO2: 29 meq/L (ref 19–32)
CREATININE: 1.12 mg/dL (ref 0.40–1.50)
Chloride: 101 mEq/L (ref 96–112)
GFR: 72.79 mL/min (ref >60.00)
Glucose, Bld: 196 mg/dL — ABNORMAL HIGH (ref 70–99)
Potassium: 4.2 mEq/L (ref 3.5–5.1)
SODIUM: 137 meq/L (ref 135–145)

## 2016-01-15 LAB — HEMOGLOBIN A1C: HEMOGLOBIN A1C: 8.5 % — AB (ref 4.6–6.5)

## 2016-01-15 NOTE — Progress Notes (Signed)
Pre visit review using our clinic review tool, if applicable. No additional management support is needed unless otherwise documented below in the visit note. 

## 2016-01-15 NOTE — Assessment & Plan Note (Signed)
stable overall by history and exam, recent data reviewed with pt, and pt to continue medical treatment as before,  to f/u any worsening symptoms or concerns Lab Results  Component Value Date   LDLCALC 79 01/15/2016

## 2016-01-15 NOTE — Patient Instructions (Signed)

## 2016-01-15 NOTE — Assessment & Plan Note (Addendum)
stable overall by history and exam, recent data reviewed with pt, and pt to continue medical treatment as before,  to f/u any worsening symptoms or concerns, for f/u a1c today, cont diet and wt loss efforts  In addition to the time spent performing CPE, I spent an additional 15 minutes face to face,in which greater than 50% of this time was spent in counseling and coordination of care for patient's acute illness as documented.

## 2016-01-15 NOTE — Assessment & Plan Note (Signed)
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
## 2016-01-15 NOTE — Assessment & Plan Note (Signed)
Likely situational, declines med changes, o/w stable overall by history and exam, recent data reviewed with pt, and pt to continue medical treatment as before,  to f/u any worsening symptoms or concerns BP Readings from Last 3 Encounters:  01/15/16 142/64  09/09/15 110/70  06/24/15 118/68

## 2016-01-16 LAB — HEPATITIS C ANTIBODY: HCV Ab: NEGATIVE

## 2016-01-20 ENCOUNTER — Encounter: Payer: Self-pay | Admitting: Internal Medicine

## 2016-01-20 ENCOUNTER — Other Ambulatory Visit: Payer: Self-pay | Admitting: Internal Medicine

## 2016-01-20 MED ORDER — METFORMIN HCL ER 500 MG PO TB24: ORAL_TABLET | ORAL | Status: DC

## 2016-01-21 NOTE — Telephone Encounter (Signed)
Corinne to call pt to find out his question or concern please

## 2016-01-21 NOTE — Telephone Encounter (Signed)
Patient called back.  He does not want to make appointment.  Is requesting Corrine to call back at [phone]

## 2016-02-12 ENCOUNTER — Encounter: Payer: Self-pay | Admitting: Internal Medicine

## 2016-02-12 ENCOUNTER — Ambulatory Visit (INDEPENDENT_AMBULATORY_CARE_PROVIDER_SITE_OTHER): Payer: PRIVATE HEALTH INSURANCE | Admitting: Internal Medicine

## 2016-02-12 VITALS — BP 134/80 | HR 103 | Temp 98.1°F | Resp 20 | Wt 331.0 lb

## 2016-02-12 DIAGNOSIS — R05 Cough: Secondary | ICD-10-CM | POA: Diagnosis not present

## 2016-02-12 DIAGNOSIS — E119 Type 2 diabetes mellitus without complications: Secondary | ICD-10-CM | POA: Diagnosis not present

## 2016-02-12 DIAGNOSIS — I10 Essential (primary) hypertension: Secondary | ICD-10-CM | POA: Diagnosis not present

## 2016-02-12 DIAGNOSIS — R059 Cough, unspecified: Secondary | ICD-10-CM

## 2016-02-12 MED ORDER — HYDROCODONE-HOMATROPINE 5-1.5 MG/5ML PO SYRP: 5.0000 mL | ORAL_SOLUTION | Freq: Four times a day (QID) | ORAL | Status: DC | PRN

## 2016-02-12 MED ORDER — AZITHROMYCIN 250 MG PO TABS: ORAL_TABLET | ORAL | Status: DC

## 2016-02-12 NOTE — Patient Instructions (Signed)
Please take all new medication as prescribed - the antibiotic, and cough medicine as needed  Please continue all other medications as before, and refills have been done if requested.  Please have the pharmacy call with any other refills you may need.  Please continue your efforts at being more active, low cholesterol diet, and weight control.  Please keep your appointments with your specialists as you may have planned   

## 2016-02-12 NOTE — Progress Notes (Signed)
   Subjective:    Patient ID: Martin Lozano, male    DOB: 1961/08/17, 55 y.o.   MRN: [id number]  HPI  Here with acute onset mild to mod 2-3 days ST, HA, general weakness and malaise, with prod cough greenish sputum, but Pt denies chest pain, increased sob or doe, wheezing, orthopnea, PND, increased LE swelling, palpitations, dizziness or syncope.  Pt denies new neurological symptoms such as new headache, or facial or extremity weakness or numbness   Pt denies polydipsia, polyuria, or low sugar symptoms such as weakness or confusion improved with po intake.  Pt states overall good compliance with meds, trying to follow lower cholesterol, diabetic diet, wt overall stable but little exercise however.    Past Medical History  Diagnosis Date  . ALLERGIC RHINITIS 09/06/2007  . ASTHMATIC BRONCHITIS, ACUTE 11/16/2007  . HEMORRHOIDS 07/03/2007  . HYPERLIPIDEMIA 09/06/2007  . HYPERTENSION 07/03/2007  . KNEE SPRAIN, LEFT 07/30/2008  . OBESITY, MORBID 07/03/2007  . Other tenosynovitis of hand and wrist 06/11/2010  . OTITIS MEDIA, ACUTE, LEFT 11/10/2009  . Wheezing 09/09/2009  . Type II or unspecified type diabetes mellitus without mention of complication, uncontrolled 09/25/2012   No past surgical history on file.  reports that he has never smoked. He does not have any smokeless tobacco history on file. He reports that he drinks alcohol. He reports that he does not use illicit drugs. family history is not on file. No Known Allergies Current Outpatient Prescriptions on File Prior to Visit  Medication Sig Dispense Refill  . aspirin 81 MG tablet Take 81 mg by mouth daily.      Marland Kitchen. lisinopril (PRINIVIL,ZESTRIL) 20 MG tablet TAKE 1 TABLET EVERY DAY 90 tablet 3  . metFORMIN (GLUCOPHAGE XR) 500 MG 24 hr tablet 2 tabs by mouth in the AM 180 tablet 3  . rosuvastatin (CRESTOR) 20 MG tablet TAKE 1 TABLET (20 MG TOTAL) BY MOUTH DAILY. 90 tablet 3   No current facility-administered medications on file prior to visit.    Review of Systems  Constitutional: Negative for unusual diaphoresis or night sweats HENT: Negative for ear swelling or discharge Eyes: Negative for worsening visual haziness  Respiratory: Negative for choking and stridor.   Gastrointestinal: Negative for distension or worsening eructation Genitourinary: Negative for retention or change in urine volume.  Musculoskeletal: Negative for other MSK pain or swelling Skin: Negative for color change and worsening wound Neurological: Negative for tremors and numbness other than noted  Psychiatric/Behavioral: Negative for decreased concentration or agitation other than above       Objective:   Physical Exam BP 134/80 mmHg  Pulse 103  Temp(Src) 98.1 F (36.7 C) (Oral)  Resp 20  Wt 331 lb (150.141 kg)  SpO2 98% VS noted, mild ill Constitutional: Pt appears in no apparent distress HENT: Head: NCAT.  Right Ear: External ear normal.  Left Ear: External ear normal.  Bilat tm's with mild erythema.  Max sinus areas non tender.  Pharynx with mild erythema, no exudate Eyes: . Pupils are equal, round, and reactive to light. Conjunctivae and EOM are normal Neck: Normal range of motion. Neck supple.  Cardiovascular: Normal rate and regular rhythm.   Pulmonary/Chest: Effort normal and breath sounds without rales or wheezing.  Neurological: Pt is alert. Not confused , motor grossly intact Skin: Skin is warm. No rash, no LE edema Psychiatric: Pt behavior is normal. No agitation.     Assessment & Plan:

## 2016-02-12 NOTE — Progress Notes (Signed)
Pre visit review using our clinic review tool, if applicable. No additional management support is needed unless otherwise documented below in the visit note. 

## 2016-02-12 NOTE — Assessment & Plan Note (Signed)
stable overall by history and exam, recent data reviewed with pt, and pt to continue medical treatment as before,  to f/u any worsening symptoms or concerns Lab Results  Component Value Date   HGBA1C 8.5* 01/15/2016

## 2016-02-12 NOTE — Assessment & Plan Note (Signed)
Mild to mod, c/w bornchitis vs pna, declines cxr, for antibx course, cough med prn,  to f/u any worsening symptoms or concerns °

## 2016-02-12 NOTE — Assessment & Plan Note (Signed)
stable overall by history and exam, recent data reviewed with pt, and pt to continue medical treatment as before,  to f/u any worsening symptoms or concerns BP Readings from Last 3 Encounters:  02/12/16 134/80  01/15/16 142/64  09/09/15 110/70

## 2016-02-26 ENCOUNTER — Ambulatory Visit: Payer: PRIVATE HEALTH INSURANCE | Admitting: Internal Medicine

## 2016-06-25 ENCOUNTER — Ambulatory Visit (INDEPENDENT_AMBULATORY_CARE_PROVIDER_SITE_OTHER): Payer: 59 | Admitting: Internal Medicine

## 2016-06-25 ENCOUNTER — Encounter: Payer: Self-pay | Admitting: Internal Medicine

## 2016-06-25 VITALS — BP 118/58 | HR 84 | Temp 98.3°F | Ht 71.5 in | Wt 329.0 lb

## 2016-06-25 DIAGNOSIS — Z23 Encounter for immunization: Secondary | ICD-10-CM

## 2016-06-25 DIAGNOSIS — I10 Essential (primary) hypertension: Secondary | ICD-10-CM

## 2016-06-25 DIAGNOSIS — E119 Type 2 diabetes mellitus without complications: Secondary | ICD-10-CM | POA: Diagnosis not present

## 2016-06-25 DIAGNOSIS — R002 Palpitations: Secondary | ICD-10-CM | POA: Diagnosis not present

## 2016-06-25 DIAGNOSIS — R05 Cough: Secondary | ICD-10-CM

## 2016-06-25 DIAGNOSIS — R059 Cough, unspecified: Secondary | ICD-10-CM

## 2016-06-25 MED ORDER — LEVOFLOXACIN 500 MG PO TABS: 500.0000 mg | ORAL_TABLET | Freq: Every day | ORAL | 0 refills | Status: AC

## 2016-06-25 MED ORDER — HYDROCODONE-HOMATROPINE 5-1.5 MG/5ML PO SYRP: 5.0000 mL | ORAL_SOLUTION | Freq: Four times a day (QID) | ORAL | 0 refills | Status: AC | PRN

## 2016-06-25 NOTE — Patient Instructions (Signed)
Please take all new medication as prescribed - the antibiotic, and the cough medicine  Please continue all other medications as before, and refills have been done if requested.  Please have the pharmacy call with any other refills you may need.  Please keep your appointments with your specialists as you may have planned   

## 2016-06-25 NOTE — Assessment & Plan Note (Addendum)
stable overall by history and exam, recent data reviewed with pt, and pt to continue medical treatment as before,  to f/u any worsening symptoms or concerns Lab Results  Component Value Date   HGBA1C 8.5 (H) 01/15/2016   Pt to call for worsening polys with illness

## 2016-06-25 NOTE — Progress Notes (Signed)
Subjective:    Patient ID: Martin RossettiRonald B Lozano, male    DOB: [birth year], 55 y.o.   MRN: [id number]  HPI Here with acute onset mild to mod 2-3 days ST, HA, general weakness and malaise, with prod cough greenish sputum, but Pt denies chest pain, increased sob or doe, wheezing, orthopnea, PND, increased LE swelling,  dizziness or syncope.  Pt denies new neurological symptoms such as new headache, or facial or extremity weakness or numbness   Pt denies polydipsia, polyuria, Has also had 3 episodes palpitations assoc with low sugar over 18 mo, lasts seconds only, feels like a fluttering, but no other symptoms such as pain, dizziness or syncope. Past Medical History:  Diagnosis Date  . ALLERGIC RHINITIS 09/06/2007  . ASTHMATIC BRONCHITIS, ACUTE 11/16/2007  . HEMORRHOIDS 07/03/2007  . HYPERLIPIDEMIA 09/06/2007  . HYPERTENSION 07/03/2007  . KNEE SPRAIN, LEFT 07/30/2008  . OBESITY, MORBID 07/03/2007  . Other tenosynovitis of hand and wrist 06/11/2010  . OTITIS MEDIA, ACUTE, LEFT 11/10/2009  . Type II or unspecified type diabetes mellitus without mention of complication, uncontrolled 09/25/2012  . Wheezing 09/09/2009   No past surgical history on file.  reports that he has never smoked. He does not have any smokeless tobacco history on file. He reports that he drinks alcohol. He reports that he does not use drugs. family history is not on file. No Known Allergies Current Outpatient Prescriptions on File Prior to Visit  Medication Sig Dispense Refill  . aspirin 81 MG tablet Take 81 mg by mouth daily.      Marland Kitchen. lisinopril (PRINIVIL,ZESTRIL) 20 MG tablet TAKE 1 TABLET EVERY DAY 90 tablet 3  . metFORMIN (GLUCOPHAGE XR) 500 MG 24 hr tablet 2 tabs by mouth in the AM 180 tablet 3  . rosuvastatin (CRESTOR) 20 MG tablet TAKE 1 TABLET (20 MG TOTAL) BY MOUTH DAILY. 90 tablet 3   No current facility-administered medications on file prior to visit.    Review of Systems  Constitutional: Negative for unusual diaphoresis or  night sweats HENT: Negative for ear swelling or discharge Eyes: Negative for worsening visual haziness  Respiratory: Negative for choking and stridor.   Gastrointestinal: Negative for distension or worsening eructation Genitourinary: Negative for retention or change in urine volume.  Musculoskeletal: Negative for other MSK pain or swelling Skin: Negative for color change and worsening wound Neurological: Negative for tremors and numbness other than noted  Psychiatric/Behavioral: Negative for decreased concentration or agitation other than above       Objective:   Physical Exam BP (!) 118/58 (BP Location: Left Arm, Patient Position: Sitting, Cuff Size: Large)   Pulse 84   Temp 98.3 F (36.8 C) (Oral)   Ht 5' 11.5" (1.816 m)   Wt (!) 329 lb (149.2 kg)   SpO2 97%   BMI 45.25 kg/m  VS noted, mild ill Constitutional: Pt appears in no apparent distress HENT: Head: NCAT.  Right Ear: External ear normal.  Left Ear: External ear normal.  Bilat tm's with mild erythema.  Max sinus areas non tender.  Pharynx with mild erythema, no exudate Eyes: . Pupils are equal, round, and reactive to light. Conjunctivae and EOM are normal Neck: Normal range of motion. Neck supple.  Cardiovascular: Normal rate and regular rhythm.   Pulmonary/Chest: Effort normal and breath sounds decreased without rales or wheezing.  Neurological: Pt is alert. Not confused , motor grossly intact Skin: Skin is warm. No rash, no LE edema Psychiatric: Pt behavior is normal. No agitation.  Assessment & Plan:

## 2016-06-25 NOTE — Assessment & Plan Note (Signed)
Mild to mod, c/w bronchitis vs pna, declines cxr, for antibx course, cough med prn,  to f/u any worsening symptoms or concerns 

## 2016-06-25 NOTE — Progress Notes (Signed)
Pre visit review using our clinic review tool, if applicable. No additional management support is needed unless otherwise documented below in the visit note. 

## 2016-06-25 NOTE — Assessment & Plan Note (Signed)
stable overall by history and exam, recent data reviewed with pt, and pt to continue medical treatment as before,  to f/u any worsening symptoms or concerns BP Readings from Last 3 Encounters:  06/25/16 (!) 118/58  02/12/16 134/80  01/15/16 (!) 142/64

## 2016-06-25 NOTE — Assessment & Plan Note (Signed)
Suspect benign and very infrequent, declines ecg, ok to follow

## 2016-09-28 IMAGING — CR DG CHEST 2V
2 series · 2 of 2 positions shown · non-contrast
Comparison: Portable chest x-ray June 23, 2008

CLINICAL DATA: Five days of dyspnea on exertion, 7 days of cough,
history of bronchitis and pneumonia.

EXAM:
CHEST  2 VIEW

[view not recorded (1 of 2)]
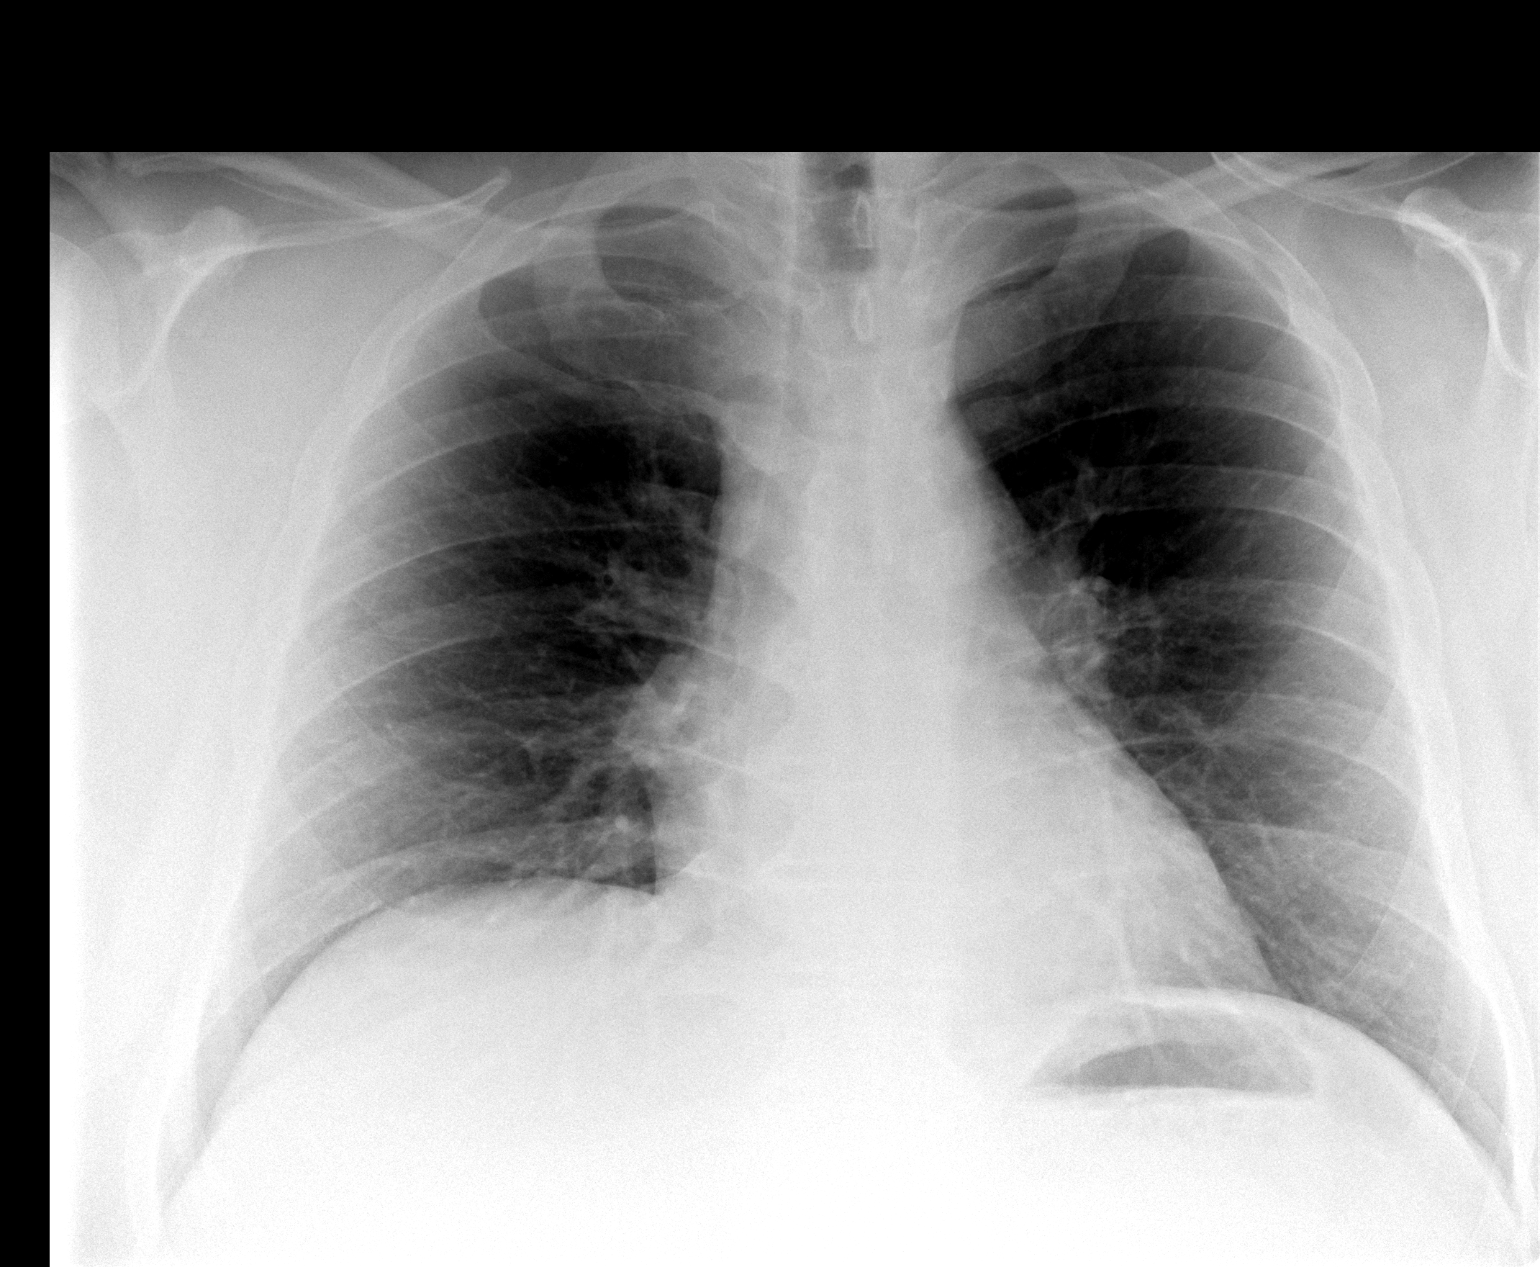

[view not recorded (2 of 2)]
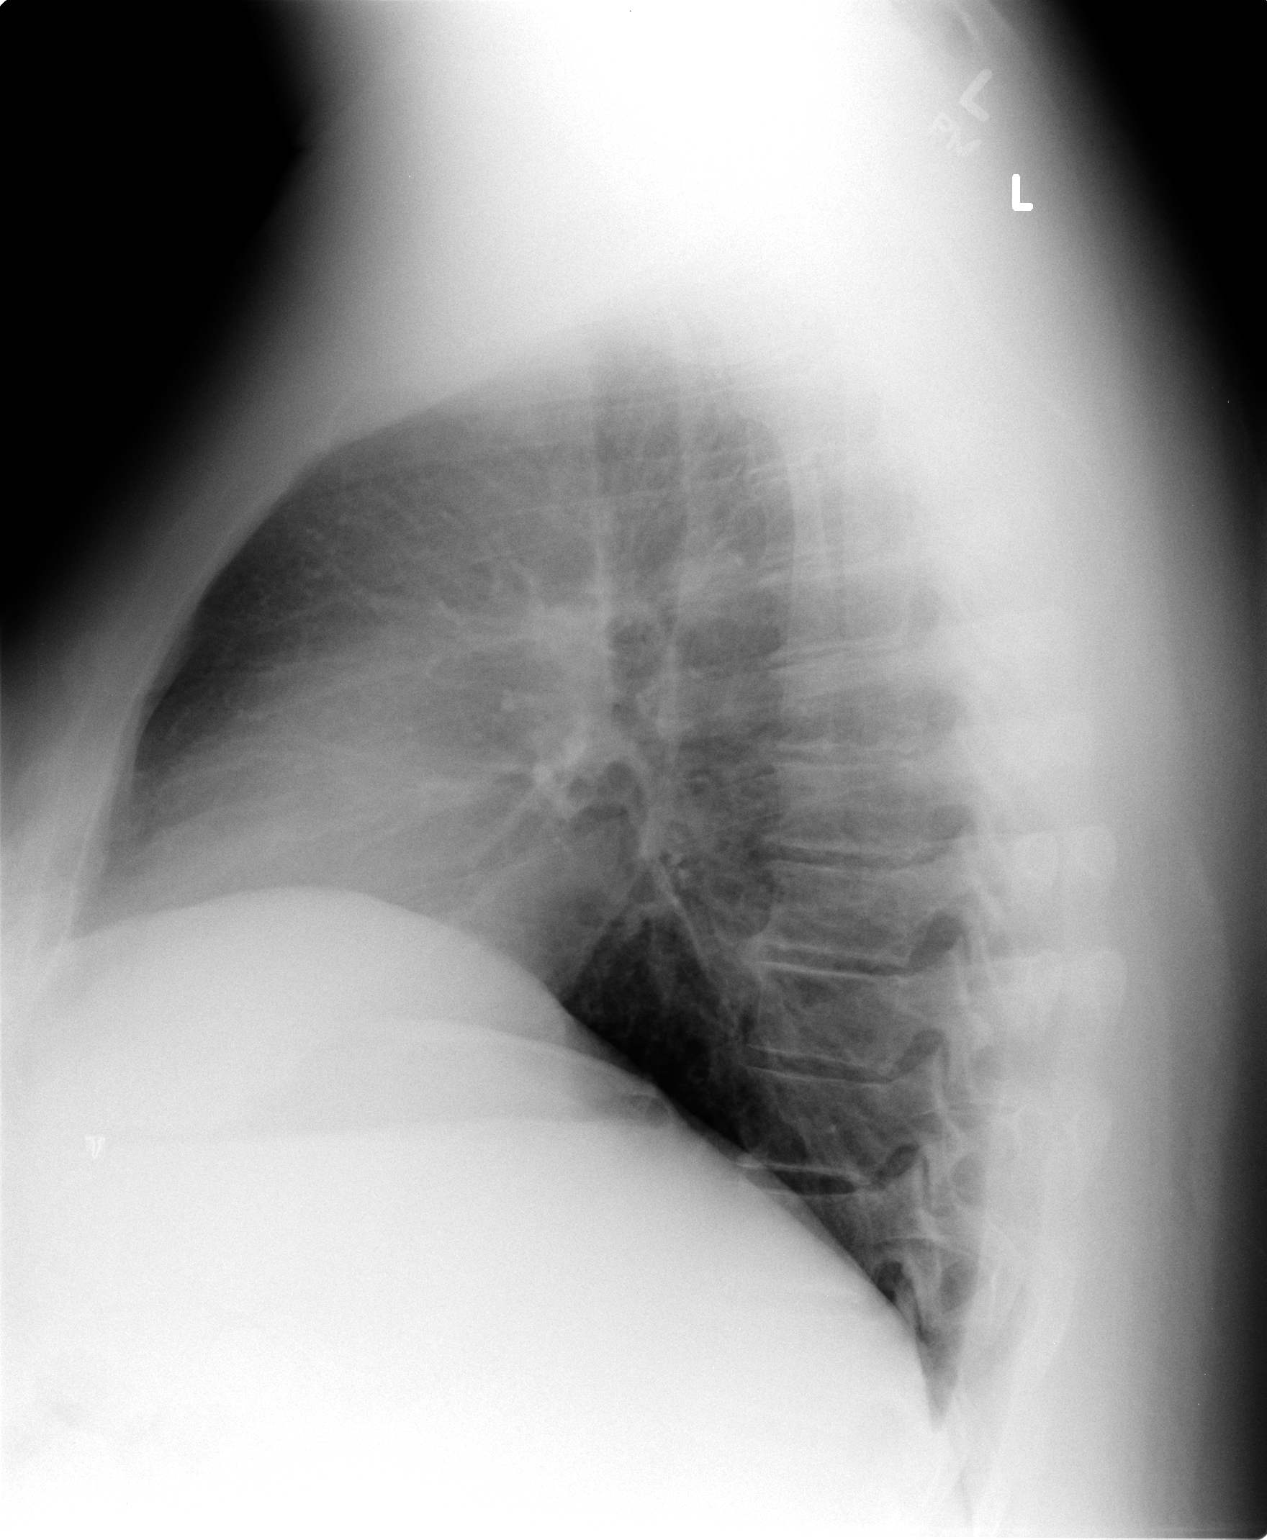

[2 of 2 positions shown; findings below may reference images not displayed]

FINDINGS: The lungs are adequately inflated and clear. The heart and pulmonary
vascularity are normal. The mediastinum is normal in width. There is
no pleural effusion. The bony thorax is unremarkable.
IMPRESSION: There is no pneumonia nor other active cardiopulmonary disease.

## 2016-10-24 ENCOUNTER — Other Ambulatory Visit: Payer: Self-pay | Admitting: Internal Medicine

## 2017-01-20 ENCOUNTER — Telehealth: Payer: Self-pay | Admitting: Internal Medicine

## 2017-01-20 DIAGNOSIS — Z125 Encounter for screening for malignant neoplasm of prostate: Secondary | ICD-10-CM

## 2017-01-20 DIAGNOSIS — E119 Type 2 diabetes mellitus without complications: Secondary | ICD-10-CM

## 2017-01-20 DIAGNOSIS — I10 Essential (primary) hypertension: Secondary | ICD-10-CM

## 2017-01-20 DIAGNOSIS — E785 Hyperlipidemia, unspecified: Secondary | ICD-10-CM

## 2017-01-20 DIAGNOSIS — Z1329 Encounter for screening for other suspected endocrine disorder: Secondary | ICD-10-CM

## 2017-01-20 DIAGNOSIS — Z0000 Encounter for general adult medical examination without abnormal findings: Secondary | ICD-10-CM

## 2017-01-20 NOTE — Telephone Encounter (Signed)
Patient has scheduled CPE for 6/7.  He is requesting labs to be entered so he can have done before appt.

## 2017-01-20 NOTE — Telephone Encounter (Signed)
Done. I will inform pt.

## 2017-01-28 ENCOUNTER — Other Ambulatory Visit: Payer: Self-pay | Admitting: Internal Medicine

## 2017-02-25 ENCOUNTER — Other Ambulatory Visit (INDEPENDENT_AMBULATORY_CARE_PROVIDER_SITE_OTHER): Payer: Self-pay

## 2017-02-25 DIAGNOSIS — Z125 Encounter for screening for malignant neoplasm of prostate: Secondary | ICD-10-CM

## 2017-02-25 DIAGNOSIS — Z0000 Encounter for general adult medical examination without abnormal findings: Secondary | ICD-10-CM

## 2017-02-25 DIAGNOSIS — E785 Hyperlipidemia, unspecified: Secondary | ICD-10-CM

## 2017-02-25 DIAGNOSIS — Z1329 Encounter for screening for other suspected endocrine disorder: Secondary | ICD-10-CM

## 2017-02-25 DIAGNOSIS — E119 Type 2 diabetes mellitus without complications: Secondary | ICD-10-CM

## 2017-02-25 DIAGNOSIS — I10 Essential (primary) hypertension: Secondary | ICD-10-CM

## 2017-02-25 LAB — BASIC METABOLIC PANEL
BUN: 11 mg/dL (ref 6–23)
CHLORIDE: 102 meq/L (ref 96–112)
CO2: 29 meq/L (ref 19–32)
Calcium: 9.4 mg/dL (ref 8.4–10.5)
Creatinine, Ser: 1 mg/dL (ref 0.40–1.50)
GFR: 82.61 mL/min (ref >60.00)
GLUCOSE: 161 mg/dL — AB (ref 70–99)
POTASSIUM: 4.8 meq/L (ref 3.5–5.1)
Sodium: 139 mEq/L (ref 135–145)

## 2017-02-25 LAB — URINALYSIS, ROUTINE W REFLEX MICROSCOPIC
Bilirubin Urine: NEGATIVE
KETONES UR: NEGATIVE
LEUKOCYTES UA: NEGATIVE
Nitrite: NEGATIVE
PH: 5.5 (ref 5.0–8.0)
Total Protein, Urine: NEGATIVE
URINE GLUCOSE: 500 — AB
UROBILINOGEN UA: 0.2 (ref 0.0–1.0)
WBC, UA: NONE SEEN (ref 0–?)

## 2017-02-25 LAB — CBC WITH DIFFERENTIAL/PLATELET
BASOS ABS: 0.1 10*3/uL (ref 0.0–0.1)
Basophils Relative: 0.9 % (ref 0.0–3.0)
Eosinophils Absolute: 0.3 10*3/uL (ref 0.0–0.7)
Eosinophils Relative: 3.2 % (ref 0.0–5.0)
HEMATOCRIT: 42.4 % (ref 39.0–52.0)
Hemoglobin: 14.2 g/dL (ref 13.0–17.0)
LYMPHS PCT: 27.2 % (ref 12.0–46.0)
Lymphs Abs: 2.3 10*3/uL (ref 0.7–4.0)
MCHC: 33.5 g/dL (ref 30.0–36.0)
MCV: 85.9 fl (ref 78.0–100.0)
MONOS PCT: 5.8 % (ref 3.0–12.0)
Monocytes Absolute: 0.5 10*3/uL (ref 0.1–1.0)
NEUTROS PCT: 62.9 % (ref 43.0–77.0)
Neutro Abs: 5.4 10*3/uL (ref 1.4–7.7)
Platelets: 236 10*3/uL (ref 150.0–400.0)
RBC: 4.93 Mil/uL (ref 4.22–5.81)
RDW: 13.3 % (ref 11.5–15.5)
WBC: 8.6 10*3/uL (ref 4.0–10.5)

## 2017-02-25 LAB — TSH: TSH: 1.07 u[IU]/mL (ref 0.35–4.50)

## 2017-02-25 LAB — PSA: PSA: 0.29 ng/mL (ref 0.10–4.00)

## 2017-02-25 LAB — HEPATIC FUNCTION PANEL
ALBUMIN: 4.2 g/dL (ref 3.5–5.2)
ALT: 17 U/L (ref 0–53)
AST: 14 U/L (ref 0–37)
Alkaline Phosphatase: 55 U/L (ref 39–117)
Bilirubin, Direct: 0.1 mg/dL (ref 0.0–0.3)
TOTAL PROTEIN: 6.8 g/dL (ref 6.0–8.3)
Total Bilirubin: 0.4 mg/dL (ref 0.2–1.2)

## 2017-02-25 LAB — LIPID PANEL
CHOL/HDL RATIO: 4
Cholesterol: 135 mg/dL (ref 0–200)
HDL: 32.3 mg/dL — ABNORMAL LOW (ref >39.00)
LDL Cholesterol: 80 mg/dL (ref 0–99)
NONHDL: 102.8
Triglycerides: 114 mg/dL (ref 0.0–149.0)
VLDL: 22.8 mg/dL (ref 0.0–40.0)

## 2017-02-25 LAB — HEMOGLOBIN A1C: Hgb A1c MFr Bld: 7.8 % — ABNORMAL HIGH (ref 4.6–6.5)

## 2017-03-10 ENCOUNTER — Encounter: Payer: Self-pay | Admitting: Internal Medicine

## 2017-03-10 ENCOUNTER — Ambulatory Visit (INDEPENDENT_AMBULATORY_CARE_PROVIDER_SITE_OTHER): Payer: Managed Care, Other (non HMO) | Admitting: Internal Medicine

## 2017-03-10 VITALS — BP 118/74 | HR 78 | Ht 71.0 in | Wt 328.0 lb

## 2017-03-10 DIAGNOSIS — Z23 Encounter for immunization: Secondary | ICD-10-CM

## 2017-03-10 DIAGNOSIS — Z114 Encounter for screening for human immunodeficiency virus [HIV]: Secondary | ICD-10-CM | POA: Diagnosis not present

## 2017-03-10 DIAGNOSIS — Z0000 Encounter for general adult medical examination without abnormal findings: Secondary | ICD-10-CM

## 2017-03-10 DIAGNOSIS — E119 Type 2 diabetes mellitus without complications: Secondary | ICD-10-CM

## 2017-03-10 MED ORDER — METFORMIN HCL ER 500 MG PO TB24: ORAL_TABLET | ORAL | 3 refills | Status: DC

## 2017-03-10 NOTE — Progress Notes (Signed)
Subjective:    Patient ID: Martin RossettiRonald B Minihan, male    DOB: 04-05-1961, 56 y.o.   MRN: [id number]  HPI  Here for wellness and f/u;  Overall doing ok;  Pt denies Chest pain, worsening SOB, DOE, wheezing, orthopnea, PND, worsening LE edema, palpitations, dizziness or syncope.  Pt denies neurological change such as new headache, facial or extremity weakness.  Pt denies polydipsia, polyuria, or low sugar symptoms. Pt states overall good compliance with treatment and medications, good tolerability, and has been trying to follow appropriate diet.  Pt denies worsening depressive symptoms, suicidal ideation or panic. No fever, night sweats, wt loss, loss of appetite, or other constitutional symptoms.  Pt states good ability with ADL's, has low fall risk, home safety reviewed and adequate, no other significant changes in hearing or vision, and only occasionally active with exercise Wt Readings from Last 3 Encounters:  03/10/17 (!) 328 lb (148.8 kg)  06/25/16 (!) 329 lb (149.2 kg)  02/12/16 (!) 331 lb (150.1 kg)   Past Medical History:  Diagnosis Date  . ALLERGIC RHINITIS 09/06/2007  . ASTHMATIC BRONCHITIS, ACUTE 11/16/2007  . HEMORRHOIDS 07/03/2007  . HYPERLIPIDEMIA 09/06/2007  . HYPERTENSION 07/03/2007  . KNEE SPRAIN, LEFT 07/30/2008  . OBESITY, MORBID 07/03/2007  . Other tenosynovitis of hand and wrist 06/11/2010  . OTITIS MEDIA, ACUTE, LEFT 11/10/2009  . Type II or unspecified type diabetes mellitus without mention of complication, uncontrolled 09/25/2012  . Wheezing 09/09/2009   No past surgical history on file.  reports that he has never smoked. He has never used smokeless tobacco. He reports that he drinks alcohol. He reports that he does not use drugs. family history is not on file. No Known Allergies Current Outpatient Prescriptions on File Prior to Visit  Medication Sig Dispense Refill  . aspirin 81 MG tablet Take 81 mg by mouth daily.      Marland Kitchen. lisinopril (PRINIVIL,ZESTRIL) 20 MG tablet TAKE 1  TABLET EVERY DAY 90 tablet 2  . rosuvastatin (CRESTOR) 20 MG tablet TAKE 1 TABLET EVERY DAY 90 tablet 2   No current facility-administered medications on file prior to visit.    Review of Systems Constitutional: Negative for other unusual diaphoresis, sweats, appetite or weight changes HENT: Negative for other worsening hearing loss, ear pain, facial swelling, mouth sores or neck stiffness.   Eyes: Negative for other worsening pain, redness or other visual disturbance.  Respiratory: Negative for other stridor or swelling Cardiovascular: Negative for other palpitations or other chest pain  Gastrointestinal: Negative for worsening diarrhea or loose stools, blood in stool, distention or other pain Genitourinary: Negative for hematuria, flank pain or other change in urine volume.  Musculoskeletal: Negative for myalgias or other joint swelling.  Skin: Negative for other color change, or other wound or worsening drainage.  Neurological: Negative for other syncope or numbness. Hematological: Negative for other adenopathy or swelling Psychiatric/Behavioral: Negative for hallucinations, other worsening agitation, SI, self-injury, or new decreased concentration All other system neg per pt    Objective:   Physical Exam  BP 118/74   Pulse 78   Ht 5\' 11"  (1.803 m)   Wt (!) 328 lb (148.8 kg)   SpO2 99%   BMI 45.75 kg/m  VS noted,  Constitutional: Pt is oriented to person, place, and time. Appears well-developed and well-nourished, in no significant distress and comfortable Head: Normocephalic and atraumatic  Eyes: Conjunctivae and EOM are normal. Pupils are equal, round, and reactive to light Right Ear: External ear normal without discharge  Left Ear: External ear normal without discharge Nose: Nose without discharge or deformity Mouth/Throat: Oropharynx is without other ulcerations and moist  Neck: Normal range of motion. Neck supple. No JVD present. No tracheal deviation present or  significant neck LA or mass Cardiovascular: Normal rate, regular rhythm, normal heart sounds and intact distal pulses.   Pulmonary/Chest: WOB normal and breath sounds without rales or wheezing  Abdominal: Soft. Bowel sounds are normal. NT. No HSM  Musculoskeletal: Normal range of motion. Exhibits no edema Lymphadenopathy: Has no other cervical adenopathy.  Neurological: Pt is alert and oriented to person, place, and time. Pt has normal reflexes. No cranial nerve deficit. Motor grossly intact, Gait intact Skin: Skin is warm and dry. No rash noted or new ulcerations Psychiatric:  Has normal mood and affect. Behavior is normal without agitation No other exam findings Lab Results  Component Value Date   WBC 8.6 02/25/2017   HGB 14.2 02/25/2017   HCT 42.4 02/25/2017   PLT 236.0 02/25/2017   GLUCOSE 161 (H) 02/25/2017   CHOL 135 02/25/2017   TRIG 114.0 02/25/2017   HDL 32.30 (L) 02/25/2017   LDLDIRECT 86.5 11/04/2011   LDLCALC 80 02/25/2017   ALT 17 02/25/2017   AST 14 02/25/2017   NA 139 02/25/2017   K 4.8 02/25/2017   CL 102 02/25/2017   CREATININE 1.00 02/25/2017   BUN 11 02/25/2017   CO2 29 02/25/2017   TSH 1.07 02/25/2017   PSA 0.29 02/25/2017   HGBA1C 7.8 (H) 02/25/2017   MICROALBUR 0.9 10/28/2014        Assessment & Plan:

## 2017-03-10 NOTE — Patient Instructions (Addendum)
You had the Prevnar pneumonia shot today  Please remember to se your eye doctor for exam every year  OK to increase the metformin to 3 pills in the AM  Please continue all other medications as before, and refills have been done if requested.  Please have the pharmacy call with any other refills you may need.  Please continue your efforts at being more active, low cholesterol diet, and weight control.  You are otherwise up to date with prevention measures today.  Please keep your appointments with your specialists as you may have planned  Please return in 6 months, or sooner if needed, with Lab testing done 3-5 days before

## 2017-03-11 NOTE — Assessment & Plan Note (Signed)

## 2017-03-11 NOTE — Assessment & Plan Note (Signed)
stable overall by history and exam, recent data reviewed with pt, and pt to increase metformin 500 - 3 per day,  to f/u any worsening symptoms or concerns Lab Results  Component Value Date   HGBA1C 7.8 (H) 02/25/2017

## 2017-03-25 LAB — HM DIABETES EYE EXAM

## 2017-04-21 ENCOUNTER — Encounter: Payer: Self-pay | Admitting: Internal Medicine

## 2017-04-21 ENCOUNTER — Ambulatory Visit (INDEPENDENT_AMBULATORY_CARE_PROVIDER_SITE_OTHER): Payer: Managed Care, Other (non HMO) | Admitting: Internal Medicine

## 2017-04-21 VITALS — BP 124/86 | HR 77 | Ht 71.0 in | Wt 321.0 lb

## 2017-04-21 DIAGNOSIS — E119 Type 2 diabetes mellitus without complications: Secondary | ICD-10-CM

## 2017-04-21 DIAGNOSIS — J069 Acute upper respiratory infection, unspecified: Secondary | ICD-10-CM | POA: Diagnosis not present

## 2017-04-21 DIAGNOSIS — I10 Essential (primary) hypertension: Secondary | ICD-10-CM

## 2017-04-21 MED ORDER — HYDROCODONE-HOMATROPINE 5-1.5 MG/5ML PO SYRP: 5.0000 mL | ORAL_SOLUTION | Freq: Four times a day (QID) | ORAL | 0 refills | Status: DC | PRN

## 2017-04-21 MED ORDER — AZITHROMYCIN 250 MG PO TABS: ORAL_TABLET | ORAL | 1 refills | Status: DC

## 2017-04-21 NOTE — Assessment & Plan Note (Signed)
stable overall by history and exam, recent data reviewed with pt, and pt to continue medical treatment as before,  to f/u any worsening symptoms or concerns BP Readings from Last 3 Encounters:  04/21/17 124/86  03/10/17 118/74  06/25/16 (!) 118/58

## 2017-04-21 NOTE — Patient Instructions (Signed)
Please take all new medication as prescribed - the antibiotic, and cough medicine if needed  Please continue all other medications as before, and refills have been done if requested.  Please have the pharmacy call with any other refills you may need.  Please keep your appointments with your specialists as you may have planned     

## 2017-04-21 NOTE — Progress Notes (Signed)
   Subjective:    Patient ID: Martin Lozano, male    DOB: 1961/03/02, 56 y.o.   MRN: [id number]  HPI   Here with 2-3 days acute onset fever, facial pain, pressure, headache, general weakness and malaise, and greenish d/c, with mild ST and cough, but pt denies chest pain, wheezing, increased sob or doe, orthopnea, PND, increased LE swelling, palpitations, dizziness or syncope.  Pt denies polydipsia, polyuria, or low sugar symptoms  Pt states overall good compliance with meds, trying to follow lower cholesterol, diabetic diet, wt overall stable but little exercise however.     Pt denies new neurological symptoms such as new headache, or facial or extremity weakness or numbness Past Medical History:  Diagnosis Date  . ALLERGIC RHINITIS 09/06/2007  . ASTHMATIC BRONCHITIS, ACUTE 11/16/2007  . HEMORRHOIDS 07/03/2007  . HYPERLIPIDEMIA 09/06/2007  . HYPERTENSION 07/03/2007  . KNEE SPRAIN, LEFT 07/30/2008  . OBESITY, MORBID 07/03/2007  . Other tenosynovitis of hand and wrist 06/11/2010  . OTITIS MEDIA, ACUTE, LEFT 11/10/2009  . Type II or unspecified type diabetes mellitus without mention of complication, uncontrolled 09/25/2012  . Wheezing 09/09/2009   No past surgical history on file.  reports that he has never smoked. He has never used smokeless tobacco. He reports that he drinks alcohol. He reports that he does not use drugs. family history is not on file. No Known Allergies Current Outpatient Prescriptions on File Prior to Visit  Medication Sig Dispense Refill  . aspirin 81 MG tablet Take 81 mg by mouth daily.      Marland Kitchen. lisinopril (PRINIVIL,ZESTRIL) 20 MG tablet TAKE 1 TABLET EVERY DAY 90 tablet 2  . metFORMIN (GLUCOPHAGE XR) 500 MG 24 hr tablet 3 tabs by mouth in the AM 270 tablet 3  . rosuvastatin (CRESTOR) 20 MG tablet TAKE 1 TABLET EVERY DAY 90 tablet 2   No current facility-administered medications on file prior to visit.    Review of Systems  Constitutional: Negative for other unusual  diaphoresis or sweats HENT: Negative for ear discharge or swelling Eyes: Negative for other worsening visual disturbances Respiratory: Negative for stridor or other swelling  Gastrointestinal: Negative for worsening distension or other blood Genitourinary: Negative for retention or other urinary change Musculoskeletal: Negative for other MSK pain or swelling Skin: Negative for color change or other new lesions Neurological: Negative for worsening tremors and other numbness  Psychiatric/Behavioral: Negative for worsening agitation or other fatigue All other system neg per pt    Objective:   Physical Exam BP 124/86   Pulse 77   Ht 5\' 11"  (1.803 m)   Wt (!) 321 lb (145.6 kg)   SpO2 99%   BMI 44.77 kg/m  VS noted, morbid obese Constitutional: Pt appears in NAD HENT: Head: NCAT.  Right Ear: External ear normal.  Left Ear: External ear normal.  Eyes: . Pupils are equal, round, and reactive to light. Conjunctivae and EOM are normal Bilat tm's with mild erythema.  Max sinus areas mild tender.  Pharynx with mild erythema, no exudate Nose: without d/c or deformity Neck: Neck supple. Gross normal ROM Cardiovascular: Normal rate and regular rhythm.   Pulmonary/Chest: Effort normal and breath sounds without rales or wheezing.  Neurological: Pt is alert. At baseline orientation, motor grossly intact Skin: Skin is warm. No rashes, other new lesions, no LE edema Psychiatric: Pt behavior is normal without agitation  No other exam findings    Assessment & Plan:

## 2017-04-21 NOTE — Assessment & Plan Note (Signed)
stable overall by history and exam, recent data reviewed with pt, and pt to continue medical treatment as before,  to f/u any worsening symptoms or concerns Lab Results  Component Value Date   HGBA1C 7.8 (H) 02/25/2017

## 2017-04-21 NOTE — Assessment & Plan Note (Signed)
Mild to mod, for antibx course,  to f/u any worsening symptoms or concerns 

## 2017-04-26 ENCOUNTER — Telehealth: Payer: Self-pay | Admitting: Internal Medicine

## 2017-04-26 MED ORDER — HYDROCODONE-HOMATROPINE 5-1.5 MG/5ML PO SYRP: 5.0000 mL | ORAL_SOLUTION | Freq: Four times a day (QID) | ORAL | 0 refills | Status: AC | PRN

## 2017-04-26 MED ORDER — LEVOFLOXACIN 500 MG PO TABS: 500.0000 mg | ORAL_TABLET | Freq: Every day | ORAL | 0 refills | Status: AC

## 2017-04-26 NOTE — Telephone Encounter (Signed)
Pt called and said that he doesn't feel well and he has run of his meds.  He is going out of town and still not better.  What does he need to do?

## 2017-04-26 NOTE — Addendum Note (Signed)
Addended by: Corwin LevinsJOHN, [name] on: 04/26/2017 02:39 PM   Modules accepted: Orders

## 2017-04-26 NOTE — Telephone Encounter (Addendum)
Informed pt Script at front desk  

## 2017-04-26 NOTE — Telephone Encounter (Signed)
Ok for different antobiotic - done erx

## 2017-04-26 NOTE — Telephone Encounter (Signed)
Done hardcopy to Shirron  

## 2017-04-26 NOTE — Telephone Encounter (Signed)
Pt would also like to know if he could pick up another script for the cough medicine. Please advise.

## 2017-06-20 ENCOUNTER — Ambulatory Visit (INDEPENDENT_AMBULATORY_CARE_PROVIDER_SITE_OTHER): Payer: Managed Care, Other (non HMO) | Admitting: Family Medicine

## 2017-06-20 ENCOUNTER — Encounter: Payer: Self-pay | Admitting: Family Medicine

## 2017-06-20 VITALS — BP 118/60 | HR 103 | Temp 98.3°F | Ht 71.0 in | Wt 309.1 lb

## 2017-06-20 DIAGNOSIS — J069 Acute upper respiratory infection, unspecified: Secondary | ICD-10-CM

## 2017-06-20 MED ORDER — HYDROCODONE-HOMATROPINE 5-1.5 MG/5ML PO SYRP: 5.0000 mL | ORAL_SOLUTION | Freq: Three times a day (TID) | ORAL | 0 refills | Status: DC | PRN

## 2017-06-20 NOTE — Patient Instructions (Signed)
Thank you for coming in,   Please try things such as zyrtec-D or allegra-D which is an antihistamine and decongestant.   Please try afrin which will help with nasal congestion but use for only three days.   Please also try using a netti pot on a regular occasion.  Honey can help with a sore throat.   Please follow up if your symptoms do not improve.    Please feel free to call with any questions or concerns at any time, at [phone]. --Dr. Michaelann Gunnoe  

## 2017-06-20 NOTE — Progress Notes (Signed)
  Martin Lozano - 56 y.o. male MRN [id number]  Date of birth: 08/15/1961  SUBJECTIVE:  Including CC & ROS.  Chief Complaint  Patient presents with  . URI    Patient is here today C/O possible URI.  States that he has a productive cough with a greenish sputum. Sx started on 9.13.18.    Martin Lozano is a 56 year old male that is presenting with congestion in his chest and cough. He reports his symptoms started on his return from [place]. This was about 4 days ago. He has not taken any medications to help with his symptoms currently. He has cough that seems to be worse at night. He has not been able to sleep due to the cough. He has not had any fevers. He has not had any ear pain. He feels like his symptoms are worse at night.     Review of Systems  Constitutional: Negative for fever.  HENT: Positive for rhinorrhea.   Respiratory: Positive for cough.   Cardiovascular: Negative for chest pain.  Skin: Negative for color change.  Neurological: Negative for weakness and numbness.  Hematological: Negative for adenopathy.    HISTORY: Past Medical, Surgical, Social, and Family History Reviewed & Updated per EMR.   Pertinent Historical Findings include:  Past Medical History:  Diagnosis Date  . ALLERGIC RHINITIS 09/06/2007  . ASTHMATIC BRONCHITIS, ACUTE 11/16/2007  . HEMORRHOIDS 07/03/2007  . HYPERLIPIDEMIA 09/06/2007  . HYPERTENSION 07/03/2007  . KNEE SPRAIN, LEFT 07/30/2008  . OBESITY, MORBID 07/03/2007  . Other tenosynovitis of hand and wrist 06/11/2010  . OTITIS MEDIA, ACUTE, LEFT 11/10/2009  . Type II or unspecified type diabetes mellitus without mention of complication, uncontrolled 09/25/2012  . Wheezing 09/09/2009    No past surgical history on file.  No Known Allergies  No family history on file.   Social History   Social History  . Marital status: Married    Spouse name: N/A  . Number of children: N/A  . Years of education: N/A   Occupational History  . Not on file.    Social History Main Topics  . Smoking status: Never Smoker  . Smokeless tobacco: Never Used  . Alcohol use 0.0 oz/week     Comment: rare  . Drug use: No  . Sexual activity: Not on file   Other Topics Concern  . Not on file   Social History Narrative  . No narrative on file     PHYSICAL EXAM:  VS: BP 118/60 (BP Location: Left Arm, Patient Position: Sitting, Cuff Size: Large)   Pulse (!) 103   Temp 98.3 F (36.8 C) (Oral)   Ht  (1.803 m)   Wt (!) 309 lb 1.9 oz (140.2 kg)   SpO2 96%   BMI 43.11 kg/m  Physical Exam Gen: NAD, alert, cooperative with exam,  ENT: normal lips, normal nasal mucosa, normal-appearing tympanic membranes bilaterally, oropharynx clear, normal nasal turbinates, no tonsillar exudates, no cervical lymphadenopathy. Eye: normal EOM, normal conjunctiva and lids CV:  no edema, +2 pedal pulses, tachycardia, S1-S2   Resp: no accessory muscle use, non-labored, clear to auscultation bilaterally Skin: no rashes, no areas of induration  Neuro: normal tone, normal sensation to touch Psych:  normal insight, alert and oriented MSK: Normal strength, normal gait      ASSESSMENT & PLAN:   Upper respiratory tract infection Symptoms seem to be consistent with a viral origin. - Supportive care - Hycodan for cough - Given indications to return

## 2017-06-20 NOTE — Assessment & Plan Note (Signed)
Symptoms seem to be consistent with a viral origin. - Supportive care - Hycodan for cough - Given indications to return

## 2017-06-24 ENCOUNTER — Encounter: Payer: Self-pay | Admitting: Nurse Practitioner

## 2017-06-24 ENCOUNTER — Ambulatory Visit (INDEPENDENT_AMBULATORY_CARE_PROVIDER_SITE_OTHER): Payer: Managed Care, Other (non HMO) | Admitting: Nurse Practitioner

## 2017-06-24 VITALS — BP 100/66 | HR 87 | Temp 98.3°F | Ht 71.0 in | Wt 312.0 lb

## 2017-06-24 DIAGNOSIS — J069 Acute upper respiratory infection, unspecified: Secondary | ICD-10-CM

## 2017-06-24 MED ORDER — HYDROCODONE-HOMATROPINE 5-1.5 MG/5ML PO SYRP: 10.0000 mL | ORAL_SOLUTION | Freq: Every evening | ORAL | 0 refills | Status: DC | PRN

## 2017-06-24 MED ORDER — DM-GUAIFENESIN ER 30-600 MG PO TB12: 1.0000 | ORAL_TABLET | Freq: Two times a day (BID) | ORAL | 0 refills | Status: DC | PRN

## 2017-06-24 NOTE — Progress Notes (Signed)
Subjective:  Patient ID: Martin Lozano, male    DOB: [birth year]  Age: 56 y.o. MRN: [id number]  CC: Follow-up (not better from 06/20/2017-weak,still coughing,congestion,)   URI   This is a new problem. The current episode started in the past 7 days. The problem has been unchanged. There has been no fever. Associated symptoms include congestion, coughing, rhinorrhea and a sore throat. Pertinent negatives include no chest pain, ear pain, headaches, nausea, plugged ear sensation, sinus pain, sneezing, swollen glands, vomiting or wheezing. Associated symptoms comments: Cough is worse at night. Feels fatigued due to lack of sleep. He has tried decongestant and antihistamine (and hycodan) for the symptoms. The treatment provided mild relief.  non productive cough, clear nasal drainage. does not want to use nasal spray, does not want Im depomedrol.  Outpatient Medications Prior to Visit  Medication Sig Dispense Refill  . aspirin 81 MG tablet Take 81 mg by mouth daily.      Marland Kitchen lisinopril (PRINIVIL,ZESTRIL) 20 MG tablet TAKE 1 TABLET EVERY DAY 90 tablet 2  . metFORMIN (GLUCOPHAGE XR) 500 MG 24 hr tablet 3 tabs by mouth in the AM 270 tablet 3  . rosuvastatin (CRESTOR) 20 MG tablet TAKE 1 TABLET EVERY DAY 90 tablet 2  . HYDROcodone-homatropine (HYCODAN) 5-1.5 MG/5ML syrup Take 5 mLs by mouth every 8 (eight) hours as needed for cough. (Patient not taking: Reported on 06/24/2017) 60 mL 0   No facility-administered medications prior to visit.     ROS See HPI  Objective:  BP 100/66   Pulse 87   Temp 98.3 F (36.8 C)   Ht  (1.803 m)   Wt (!) 312 lb (141.5 kg)   SpO2 98%   BMI 43.52 kg/m   BP Readings from Last 3 Encounters:  06/24/17 100/66  06/20/17 118/60  04/21/17 124/86    Wt Readings from Last 3 Encounters:  06/24/17 (!) 312 lb (141.5 kg)  06/20/17 (!) 309 lb 1.9 oz (140.2 kg)  04/21/17 (!) 321 lb (145.6 kg)    Physical Exam  Constitutional: He is oriented to person,  place, and time. No distress.  HENT:  Nose: Rhinorrhea present. No mucosal edema. Right sinus exhibits no maxillary sinus tenderness and no frontal sinus tenderness. Left sinus exhibits no maxillary sinus tenderness and no frontal sinus tenderness.  Mouth/Throat: No trismus in the jaw. Posterior oropharyngeal erythema present.  Cardiovascular: Normal rate.   Pulmonary/Chest: Effort normal and breath sounds normal. No respiratory distress.  Lymphadenopathy:    He has no cervical adenopathy.  Neurological: He is alert and oriented to person, place, and time.  Vitals reviewed.   Lab Results  Component Value Date   WBC 8.6 02/25/2017   HGB 14.2 02/25/2017   HCT 42.4 02/25/2017   PLT 236.0 02/25/2017   GLUCOSE 161 (H) 02/25/2017   CHOL 135 02/25/2017   TRIG 114.0 02/25/2017   HDL 32.30 (L) 02/25/2017   LDLDIRECT 86.5 11/04/2011   LDLCALC 80 02/25/2017   ALT 17 02/25/2017   AST 14 02/25/2017   NA 139 02/25/2017   K 4.8 02/25/2017   CL 102 02/25/2017   CREATININE 1.00 02/25/2017   BUN 11 02/25/2017   CO2 29 02/25/2017   TSH 1.07 02/25/2017   PSA 0.29 02/25/2017   HGBA1C 7.8 (H) 02/25/2017   MICROALBUR 0.9 10/28/2014    Dg Chest 2 View  Result Date: 01/22/2015 CLINICAL DATA:  Five days of dyspnea on exertion, 7 days of cough, history of bronchitis and  pneumonia. EXAM: CHEST  2 VIEW COMPARISON:  Portable chest x-ray of June 23, 2008 FINDINGS: The lungs are adequately inflated and clear. The heart and pulmonary vascularity are normal. The mediastinum is normal in width. There is no pleural effusion. The bony thorax is unremarkable. IMPRESSION: There is no pneumonia nor other active cardiopulmonary disease. Electronically Signed   By: David  [place] M.D.   On: 01/22/2015 11:53    Assessment & Plan:   Martin Lozano was seen today for follow-up.  Diagnoses and all orders for this visit:  Upper respiratory tract infection, unspecified type -     HYDROcodone-homatropine (HYCODAN)  5-1.5 MG/5ML syrup; Take 10 mLs by mouth at bedtime as needed for cough. -     dextromethorphan-guaiFENesin (MUCINEX DM) 30-600 MG 12hr tablet; Take 1 tablet by mouth 2 (two) times daily as needed for cough.   I have changed Martin Lozano's HYDROcodone-homatropine. I am also having him start on dextromethorphan-guaiFENesin. Additionally, I am having him maintain his aspirin, rosuvastatin, lisinopril, and metFORMIN.  Meds ordered this encounter  Medications  . HYDROcodone-homatropine (HYCODAN) 5-1.5 MG/5ML syrup    Sig: Take 10 mLs by mouth at bedtime as needed for cough.    Dispense:  100 mL    Refill:  0    Order Specific Question:   Supervising Provider    Answer:   Tresa Garter [[id number]]  . dextromethorphan-guaiFENesin (MUCINEX DM) 30-600 MG 12hr tablet    Sig: Take 1 tablet by mouth 2 (two) times daily as needed for cough.    Dispense:  14 tablet    Refill:  0    Order Specific Question:   Supervising Provider    Answer:   Tresa Garter [[id number]]    Follow-up: Return if symptoms worsen or fail to improve.  Alysia Penna, NP

## 2017-06-24 NOTE — Patient Instructions (Signed)

## 2017-07-30 ENCOUNTER — Other Ambulatory Visit: Payer: Self-pay | Admitting: Internal Medicine

## 2017-09-14 ENCOUNTER — Telehealth: Payer: Self-pay | Admitting: Internal Medicine

## 2017-09-14 DIAGNOSIS — J069 Acute upper respiratory infection, unspecified: Secondary | ICD-10-CM

## 2017-09-14 MED ORDER — HYDROCODONE-HOMATROPINE 5-1.5 MG/5ML PO SYRP: 10.0000 mL | ORAL_SOLUTION | Freq: Every evening | ORAL | 0 refills | Status: DC | PRN

## 2017-09-14 NOTE — Telephone Encounter (Signed)
Copied from CRM [phone]. Topic: Quick Communication - See Telephone Encounter >> Sep 14, 2017 10:29 AM Rudi CocoLathan, Tequilla Cousineau M, NT wrote: CRM for notification. See Telephone encounter for:   09/14/17. Pt. Calling to see if he could get refill on cough syrup (HYCODAN) before his appt. On Friday. Made pt. Aware of turn around time for med.

## 2017-09-14 NOTE — Telephone Encounter (Signed)
Done erx 

## 2017-09-14 NOTE — Telephone Encounter (Signed)
Notified pt rx has been sent to [facility] 

## 2017-09-16 ENCOUNTER — Encounter: Payer: Self-pay | Admitting: Internal Medicine

## 2017-09-16 ENCOUNTER — Ambulatory Visit: Payer: Managed Care, Other (non HMO) | Admitting: Internal Medicine

## 2017-09-16 VITALS — BP 128/84 | HR 97 | Temp 98.3°F | Ht 71.0 in | Wt 313.0 lb

## 2017-09-16 DIAGNOSIS — R05 Cough: Secondary | ICD-10-CM

## 2017-09-16 DIAGNOSIS — I10 Essential (primary) hypertension: Secondary | ICD-10-CM

## 2017-09-16 DIAGNOSIS — E785 Hyperlipidemia, unspecified: Secondary | ICD-10-CM

## 2017-09-16 DIAGNOSIS — J069 Acute upper respiratory infection, unspecified: Secondary | ICD-10-CM

## 2017-09-16 DIAGNOSIS — Z23 Encounter for immunization: Secondary | ICD-10-CM

## 2017-09-16 DIAGNOSIS — E119 Type 2 diabetes mellitus without complications: Secondary | ICD-10-CM

## 2017-09-16 DIAGNOSIS — R059 Cough, unspecified: Secondary | ICD-10-CM

## 2017-09-16 DIAGNOSIS — Z0000 Encounter for general adult medical examination without abnormal findings: Secondary | ICD-10-CM

## 2017-09-16 LAB — POCT GLYCOSYLATED HEMOGLOBIN (HGB A1C): HEMOGLOBIN A1C: 6.5

## 2017-09-16 MED ORDER — HYDROCODONE-HOMATROPINE 5-1.5 MG/5ML PO SYRP: 10.0000 mL | ORAL_SOLUTION | Freq: Every evening | ORAL | 0 refills | Status: DC | PRN

## 2017-09-16 MED ORDER — AZITHROMYCIN 250 MG PO TABS: ORAL_TABLET | ORAL | 1 refills | Status: DC

## 2017-09-16 NOTE — Patient Instructions (Addendum)
You had the flu shot today  Please take all new medication as prescribed - the antibiotic, and cough medicine  Your A1c was OK today- 6.5  Please continue all other medications as before, and refills have been done if requested.  Please have the pharmacy call with any other refills you may need.  Please continue your efforts at being more active, low cholesterol diet, and weight control.  Please keep your appointments with your specialists as you may have planned  Please return in 6 months, or sooner if needed, with Lab testing done 3-5 days before

## 2017-09-16 NOTE — Progress Notes (Signed)
Subjective:    Patient ID: Martin RossettiRonald B Lozano, male    DOB: [birth year], 56 y.o.   MRN: [id number]  HPI   Here with acute onset mild to mod 2-3 days ST, HA, general weakness and malaise, with prod cough greenish sputum, but Pt denies chest pain, increased sob or doe, wheezing, orthopnea, PND, increased LE swelling, palpitations, dizziness or syncope.  Pt denies new neurological symptoms such as new headache, or facial or extremity weakness or numbness   Pt denies polydipsia, polyuria,  Due for flu shot.  .. Wt Readings from Last 3 Encounters:  09/16/17 (!) 313 lb (142 kg)  06/24/17 (!) 312 lb (141.5 kg)  06/20/17 (!) 309 lb 1.9 oz (140.2 kg)   Past Medical History:  Diagnosis Date  . ALLERGIC RHINITIS 09/06/2007  . ASTHMATIC BRONCHITIS, ACUTE 11/16/2007  . HEMORRHOIDS 07/03/2007  . HYPERLIPIDEMIA 09/06/2007  . HYPERTENSION 07/03/2007  . KNEE SPRAIN, LEFT 07/30/2008  . OBESITY, MORBID 07/03/2007  . Other tenosynovitis of hand and wrist 06/11/2010  . OTITIS MEDIA, ACUTE, LEFT 11/10/2009  . Type II or unspecified type diabetes mellitus without mention of complication, uncontrolled 09/25/2012  . Wheezing 09/09/2009   No past surgical history on file.  reports that  has never smoked. he has never used smokeless tobacco. He reports that he drinks alcohol. He reports that he does not use drugs. family history is not on file. No Known Allergies Current Outpatient Medications on File Prior to Visit  Medication Sig Dispense Refill  . aspirin 81 MG tablet Take 81 mg by mouth daily.      Marland Kitchen. dextromethorphan-guaiFENesin (MUCINEX DM) 30-600 MG 12hr tablet Take 1 tablet by mouth 2 (two) times daily as needed for cough. 14 tablet 0  . lisinopril (PRINIVIL,ZESTRIL) 20 MG tablet TAKE 1 TABLET EVERY DAY 90 tablet 2  . metFORMIN (GLUCOPHAGE XR) 500 MG 24 hr tablet 3 tabs by mouth in the AM 270 tablet 3  . rosuvastatin (CRESTOR) 20 MG tablet TAKE 1 TABLET EVERY DAY 90 tablet 2   No current facility-administered  medications on file prior to visit.    Review of Systems  Constitutional: Negative for other unusual diaphoresis or sweats HENT: Negative for ear discharge or swelling Eyes: Negative for other worsening visual disturbances Respiratory: Negative for stridor or other swelling  Gastrointestinal: Negative for worsening distension or other blood Genitourinary: Negative for retention or other urinary change Musculoskeletal: Negative for other MSK pain or swelling Skin: Negative for color change or other new lesions Neurological: Negative for worsening tremors and other numbness  Psychiatric/Behavioral: Negative for worsening agitation or other fatigue All other system neg per pt    Objective:   Physical Exam BP 128/84   Pulse 97   Temp 98.3 F (36.8 C) (Oral)   Ht 5\' 11"  (1.803 m)   Wt (!) 313 lb (142 kg)   SpO2 98%   BMI 43.65 kg/m  VS noted, mild ill Constitutional: Pt appears in NAD HENT: Head: NCAT.  Right Ear: External ear normal.  Left Ear: External ear normal.  Eyes: . Pupils are equal, round, and reactive to light. Conjunctivae and EOM are normal Bilat tm's with mild erythema.  Max sinus areas non tender.  Pharynx with mild erythema, no exudate Nose: without d/c or deformity Neck: Neck supple. Gross normal ROM Cardiovascular: Normal rate and regular rhythm.   Pulmonary/Chest: Effort normal and breath sounds mild decreased without rales or wheezing.  Abd:  Soft, NT, ND, + BS, no organomegaly  Neurological: Pt is alert. At baseline orientation, motor grossly intact Skin: Skin is warm. No rashes, other new lesions, no LE edema Psychiatric: Pt behavior is normal without agitation  No other exam findings  POCT glycosylated hemoglobin (Hb A1C)  Order: [id number]  Status:  Final result Visible to patient:  No (Not Released) Dx:  Type 2 diabetes mellitus without comp...  Component 11:47  Hemoglobin A1C 6.5            Assessment & Plan:

## 2017-09-18 NOTE — Assessment & Plan Note (Signed)
stable overall by history and exam, recent data reviewed with pt, and pt to continue medical treatment as before,  to f/u any worsening symptoms or concerns Lab Results  Component Value Date   LDLCALC 80 02/25/2017

## 2017-09-18 NOTE — Assessment & Plan Note (Signed)
stable overall by history and exam, recent data reviewed with pt, and pt to continue medical treatment as before,  to f/u any worsening symptoms or concerns BP Readings from Last 3 Encounters:  09/16/17 128/84  06/24/17 100/66  06/20/17 118/60

## 2017-09-18 NOTE — Assessment & Plan Note (Signed)
Mild to mod, c/w bronchitis vs pna, declines cxr, for antibx course, to f/u any worsening symptoms or concerns  

## 2017-09-18 NOTE — Assessment & Plan Note (Signed)
stable overall by history and exam, recent data reviewed with pt, and pt to continue medical treatment as before,  to f/u any worsening symptoms or concerns Lab Results  Component Value Date   HGBA1C 6.5 09/16/2017

## 2017-10-17 ENCOUNTER — Ambulatory Visit: Payer: Managed Care, Other (non HMO) | Admitting: Family

## 2017-10-17 ENCOUNTER — Encounter: Payer: Self-pay | Admitting: Family

## 2017-10-17 DIAGNOSIS — J111 Influenza due to unidentified influenza virus with other respiratory manifestations: Secondary | ICD-10-CM | POA: Diagnosis not present

## 2017-10-17 MED ORDER — OSELTAMIVIR PHOSPHATE 75 MG PO CAPS: 75.0000 mg | ORAL_CAPSULE | Freq: Two times a day (BID) | ORAL | 0 refills | Status: AC

## 2017-10-17 MED ORDER — HYDROCODONE-HOMATROPINE 5-1.5 MG/5ML PO SYRP: 10.0000 mL | ORAL_SOLUTION | Freq: Every evening | ORAL | 0 refills | Status: DC | PRN

## 2017-10-17 NOTE — Patient Instructions (Signed)

## 2017-10-17 NOTE — Progress Notes (Signed)
Martin Lozano is a 57 y.o. male with the following history as recorded in [employer]:  Patient Active Problem List   Diagnosis Date Noted  . Palpitations 06/25/2016  . DOE (dyspnea on exertion) 01/21/2015  . Cough 01/13/2015  . Diabetes (HCC) 09/25/2012  . Right knee pain 07/02/2012  . Preventative health care 09/15/2011  . Hyperlipidemia 09/06/2007  . Allergic rhinitis 09/06/2007  . OBESITY, MORBID 07/03/2007  . Essential hypertension 07/03/2007  . HEMORRHOIDS 07/03/2007    Current Outpatient Medications  Medication Sig Dispense Refill  . aspirin 81 MG tablet Take 81 mg by mouth daily.      Marland Kitchen. HYDROcodone-homatropine (HYCODAN) 5-1.5 MG/5ML syrup Take 10 mLs by mouth at bedtime as needed for cough. 120 mL 0  . lisinopril (PRINIVIL,ZESTRIL) 20 MG tablet TAKE 1 TABLET EVERY DAY 90 tablet 2  . metFORMIN (GLUCOPHAGE XR) 500 MG 24 hr tablet 3 tabs by mouth in the AM 270 tablet 3  . rosuvastatin (CRESTOR) 20 MG tablet TAKE 1 TABLET EVERY DAY 90 tablet 2  . oseltamivir (TAMIFLU) 75 MG capsule Take 1 capsule (75 mg total) by mouth 2 (two) times daily for 5 days. 10 capsule 0   No current facility-administered medications for this visit.     Allergies: Patient has no known allergies.  Past Medical History:  Diagnosis Date  . ALLERGIC RHINITIS 09/06/2007  . ASTHMATIC BRONCHITIS, ACUTE 11/16/2007  . HEMORRHOIDS 07/03/2007  . HYPERLIPIDEMIA 09/06/2007  . HYPERTENSION 07/03/2007  . KNEE SPRAIN, LEFT 07/30/2008  . OBESITY, MORBID 07/03/2007  . Other tenosynovitis of hand and wrist 06/11/2010  . OTITIS MEDIA, ACUTE, LEFT 11/10/2009  . Type II or unspecified type diabetes mellitus without mention of complication, uncontrolled 09/25/2012  . Wheezing 09/09/2009    History reviewed. No pertinent surgical history.  History reviewed. No pertinent family history.  Social History   Tobacco Use  . Smoking status: Never Smoker  . Smokeless tobacco: Never Used  Substance Use Topics  . Alcohol use: Yes     Alcohol/week: 0.0 oz    Comment: rare    Subjective:  Started yesterday morning with "cough and congestion." + body aches; + headache; + fever; did take flu shot this year; currently on Ibuprofen for headache; denies any chest pain or shortness of breath or wheezing; requesting refill on Hycodan cough syrup which he has used in the past;   Objective:  Vitals:   10/17/17 1058  BP: 118/72  Pulse: (!) 109  Temp: 100.1 F (37.8 C)  TempSrc: Oral  SpO2: 98%  Weight: (!) 318 lb 1.9 oz (144.3 kg)  Height: 5\' 11"  (1.803 m)    General: Well developed, well nourished, in no acute distress  Skin : Warm and dry.  Head: Normocephalic and atraumatic  Eyes: Sclera and conjunctiva clear; pupils round and reactive to light; extraocular movements intact  Ears: External normal; canals clear; tympanic membranes normal  Oropharynx: Pink, supple. No suspicious lesions  Neck: Supple without thyromegaly, adenopathy  Lungs: Respirations unlabored; clear to auscultation bilaterally without wheeze, rales, rhonchi  CVS exam: normal rate and regular rhythm.   Neurologic: Alert and oriented; speech intact; face symmetrical; moves all extremities well; CNII-XII intact without focal deficit  Assessment:  1. Flu     Plan:  Rx for Tamiflu 75 mg bid x 5 days and refill given on Hycodan cough syrup; increase fluids, rest and follow-up worse, no better. Work note given for remainder of this week.   No Follow-up on file.  No orders of the defined types were placed in this encounter.   Requested Prescriptions   Signed Prescriptions Disp Refills  . oseltamivir (TAMIFLU) 75 MG capsule 10 capsule 0    Sig: Take 1 capsule (75 mg total) by mouth 2 (two) times daily for 5 days.  Marland Kitchen HYDROcodone-homatropine (HYCODAN) 5-1.5 MG/5ML syrup 120 mL 0    Sig: Take 10 mLs by mouth at bedtime as needed for cough.

## 2018-03-20 ENCOUNTER — Ambulatory Visit: Payer: Managed Care, Other (non HMO) | Admitting: Internal Medicine

## 2018-03-20 ENCOUNTER — Encounter: Payer: Self-pay | Admitting: Internal Medicine

## 2018-03-20 VITALS — BP 130/88 | HR 105 | Temp 98.6°F | Ht 71.0 in | Wt 318.0 lb

## 2018-03-20 DIAGNOSIS — R062 Wheezing: Secondary | ICD-10-CM

## 2018-03-20 DIAGNOSIS — E119 Type 2 diabetes mellitus without complications: Secondary | ICD-10-CM | POA: Diagnosis not present

## 2018-03-20 DIAGNOSIS — R05 Cough: Secondary | ICD-10-CM

## 2018-03-20 DIAGNOSIS — R059 Cough, unspecified: Secondary | ICD-10-CM

## 2018-03-20 MED ORDER — PREDNISONE 10 MG PO TABS: ORAL_TABLET | ORAL | 0 refills | Status: DC

## 2018-03-20 MED ORDER — AZITHROMYCIN 250 MG PO TABS: ORAL_TABLET | ORAL | 1 refills | Status: DC

## 2018-03-20 MED ORDER — HYDROCODONE-HOMATROPINE 5-1.5 MG/5ML PO SYRP: 5.0000 mL | ORAL_SOLUTION | Freq: Four times a day (QID) | ORAL | 0 refills | Status: AC | PRN

## 2018-03-20 NOTE — Assessment & Plan Note (Signed)
Mild to mod, c/w bronchitis vs pna, declines cxr, for antibx course, cough med prn,  to f/u any worsening symptoms or concerns 

## 2018-03-20 NOTE — Patient Instructions (Addendum)
Please take all new medication as prescribed - the antibiotic, cough medicine if needed, and prednisone  Please continue all other medications as before, and refills have been done if requested.  Please have the pharmacy call with any other refills you may need.  Please keep your appointments with your specialists as you may have planned   

## 2018-03-20 NOTE — Progress Notes (Signed)
Subjective:    Patient ID: Martin RossettiRonald B Mo, male    DOB: 1961-03-28, 57 y.o.   MRN: [id number]  HPI  Here with acute onset mild to mod 2-3 days ST, HA, general weakness and malaise, with prod cough greenish sputum, but Pt denies chest pain, increased sob or doe, wheezing, orthopnea, PND, increased LE swelling, palpitations, dizziness or syncope. Except for mild wheezing and sob since last PM   Pt denies polydipsia, polyuria.  Overall good compliance with treatment, and good medicine tolerability. Wt stable but hard to lose Wt Readings from Last 3 Encounters:  03/20/18 (!) 318 lb (144.2 kg)  10/17/17 (!) 318 lb 1.9 oz (144.3 kg)  09/16/17 (!) 313 lb (142 kg)   Past Medical History:  Diagnosis Date  . ALLERGIC RHINITIS 09/06/2007  . ASTHMATIC BRONCHITIS, ACUTE 11/16/2007  . HEMORRHOIDS 07/03/2007  . HYPERLIPIDEMIA 09/06/2007  . HYPERTENSION 07/03/2007  . KNEE SPRAIN, LEFT 07/30/2008  . OBESITY, MORBID 07/03/2007  . Other tenosynovitis of hand and wrist 06/11/2010  . OTITIS MEDIA, ACUTE, LEFT 11/10/2009  . Type II or unspecified type diabetes mellitus without mention of complication, uncontrolled 09/25/2012  . Wheezing 09/09/2009   No past surgical history on file.  reports that he has never smoked. He has never used smokeless tobacco. He reports that he drinks alcohol. He reports that he does not use drugs. family history is not on file. No Known Allergies Current Outpatient Medications on File Prior to Visit  Medication Sig Dispense Refill  . aspirin 81 MG tablet Take 81 mg by mouth daily.      Marland Kitchen. lisinopril (PRINIVIL,ZESTRIL) 20 MG tablet TAKE 1 TABLET EVERY DAY 90 tablet 2  . metFORMIN (GLUCOPHAGE XR) 500 MG 24 hr tablet 3 tabs by mouth in the AM 270 tablet 3  . rosuvastatin (CRESTOR) 20 MG tablet TAKE 1 TABLET EVERY DAY 90 tablet 2   No current facility-administered medications on file prior to visit.    Review of Systems  Constitutional: Negative for other unusual diaphoresis or  sweats HENT: Negative for ear discharge or swelling Eyes: Negative for other worsening visual disturbances Respiratory: Negative for stridor or other swelling  Gastrointestinal: Negative for worsening distension or other blood Genitourinary: Negative for retention or other urinary change Musculoskeletal: Negative for other MSK pain or swelling Skin: Negative for color change or other new lesions Neurological: Negative for worsening tremors and other numbness  Psychiatric/Behavioral: Negative for worsening agitation or other fatigue All other system neg per pt    Objective:   Physical Exam BP 130/88   Pulse (!) 105   Temp 98.6 F (37 C) (Oral)   Ht 5\' 11"  (1.803 m)   Wt (!) 318 lb (144.2 kg)   SpO2 96%   BMI 44.35 kg/m  VS noted, mild ill Constitutional: Pt appears in NAD HENT: Head: NCAT.  Right Ear: External ear normal.  Left Ear: External ear normal.  Bilat tm's with mild erythema.  Max sinus areas mild tender.  Pharynx with mild erythema, no exudate Eyes: . Pupils are equal, round, and reactive to light. Conjunctivae and EOM are normal Nose: without d/c or deformity Neck: Neck supple. Gross normal ROM Cardiovascular: Normal rate and regular rhythm.   Pulmonary/Chest: Effort normal and breath sounds decreased without rales but with few bilat soft insp wheezing.  Neurological: Pt is alert. At baseline orientation, motor grossly intact Skin: Skin is warm. No rashes, other new lesions, no LE edema Psychiatric: Pt behavior is normal without agitation  No other exam findings Lab Results  Component Value Date   WBC 8.6 02/25/2017   HGB 14.2 02/25/2017   HCT 42.4 02/25/2017   PLT 236.0 02/25/2017   GLUCOSE 161 (H) 02/25/2017   CHOL 135 02/25/2017   TRIG 114.0 02/25/2017   HDL 32.30 (L) 02/25/2017   LDLDIRECT 86.5 11/04/2011   LDLCALC 80 02/25/2017   ALT 17 02/25/2017   AST 14 02/25/2017   NA 139 02/25/2017   K 4.8 02/25/2017   CL 102 02/25/2017   CREATININE 1.00  02/25/2017   BUN 11 02/25/2017   CO2 29 02/25/2017   TSH 1.07 02/25/2017   PSA 0.29 02/25/2017   HGBA1C 6.5 09/16/2017   MICROALBUR 0.9 10/28/2014       Assessment & Plan:

## 2018-03-20 NOTE — Assessment & Plan Note (Addendum)
Mild to mod, for predpac asd,  Declines inhaler, to f/u any worsening symptoms or concerns 

## 2018-03-20 NOTE — Assessment & Plan Note (Signed)
stable overall by history and exam, recent data reviewed with pt, and pt to continue medical treatment as before,  to f/u any worsening symptoms or concerns Lab Results  Component Value Date   HGBA1C 6.5 09/16/2017  pt to call for cbg > 200 with illness and steroid tx

## 2018-04-01 ENCOUNTER — Other Ambulatory Visit: Payer: Self-pay | Admitting: Internal Medicine

## 2018-05-04 ENCOUNTER — Other Ambulatory Visit: Payer: Self-pay | Admitting: Internal Medicine

## 2018-05-23 ENCOUNTER — Other Ambulatory Visit: Payer: Self-pay | Admitting: Internal Medicine

## 2018-06-07 ENCOUNTER — Other Ambulatory Visit: Payer: Self-pay | Admitting: Internal Medicine

## 2018-07-26 ENCOUNTER — Other Ambulatory Visit: Payer: Self-pay | Admitting: Internal Medicine

## 2018-07-30 ENCOUNTER — Other Ambulatory Visit: Payer: Self-pay | Admitting: Internal Medicine

## 2018-09-26 ENCOUNTER — Encounter: Payer: Self-pay | Admitting: Internal Medicine

## 2018-09-26 ENCOUNTER — Ambulatory Visit (INDEPENDENT_AMBULATORY_CARE_PROVIDER_SITE_OTHER): Payer: Managed Care, Other (non HMO) | Admitting: Internal Medicine

## 2018-09-26 VITALS — BP 122/78 | HR 109 | Temp 100.0°F | Ht 71.0 in | Wt 312.0 lb

## 2018-09-26 DIAGNOSIS — J069 Acute upper respiratory infection, unspecified: Secondary | ICD-10-CM

## 2018-09-26 DIAGNOSIS — E119 Type 2 diabetes mellitus without complications: Secondary | ICD-10-CM | POA: Diagnosis not present

## 2018-09-26 DIAGNOSIS — I10 Essential (primary) hypertension: Secondary | ICD-10-CM | POA: Diagnosis not present

## 2018-09-26 MED ORDER — AZITHROMYCIN 250 MG PO TABS: ORAL_TABLET | ORAL | 1 refills | Status: DC

## 2018-09-26 MED ORDER — HYDROCODONE-HOMATROPINE 5-1.5 MG/5ML PO SYRP: 5.0000 mL | ORAL_SOLUTION | Freq: Four times a day (QID) | ORAL | 0 refills | Status: AC | PRN

## 2018-09-26 NOTE — Assessment & Plan Note (Signed)
Mild to mod, for antibx course,  to f/u any worsening symptoms or concerns 

## 2018-09-26 NOTE — Assessment & Plan Note (Signed)
stable overall by history and exam, recent data reviewed with pt, and pt to continue medical treatment as before,  to f/u any worsening symptoms or concerns  

## 2018-09-26 NOTE — Progress Notes (Signed)
Subjective:    Patient ID: Martin Lozano, male    DOB: 1961-08-28, 57 y.o.   MRN: [id number]  HPI   Here with 2-3 days acute onset fever, facial pain, pressure, headache, general weakness and malaise, and greenish d/c, with mild ST and cough, but pt denies chest pain, wheezing, increased sob or doe, orthopnea, PND, increased LE swelling, palpitations, dizziness or syncope.  Pt denies new neurological symptoms such as new headache, or facial or extremity weakness or numbness   Pt denies polydipsia, polyuria,\ Past Medical History:  Diagnosis Date  . ALLERGIC RHINITIS 09/06/2007  . ASTHMATIC BRONCHITIS, ACUTE 11/16/2007  . HEMORRHOIDS 07/03/2007  . HYPERLIPIDEMIA 09/06/2007  . HYPERTENSION 07/03/2007  . KNEE SPRAIN, LEFT 07/30/2008  . OBESITY, MORBID 07/03/2007  . Other tenosynovitis of hand and wrist 06/11/2010  . OTITIS MEDIA, ACUTE, LEFT 11/10/2009  . Type II or unspecified type diabetes mellitus without mention of complication, uncontrolled 09/25/2012  . Wheezing 09/09/2009   No past surgical history on file.  reports that he has never smoked. He has never used smokeless tobacco. He reports current alcohol use. He reports that he does not use drugs. family history is not on file. No Known Allergies Current Outpatient Medications on File Prior to Visit  Medication Sig Dispense Refill  . aspirin 81 MG tablet Take 81 mg by mouth daily.      . CRESTOR 20 MG tablet TAKE 1 TABLET EVERY DAY 270 tablet 1  . lisinopril (PRINIVIL,ZESTRIL) 20 MG tablet TAKE 1 TABLET EVERY DAY 90 tablet 1  . metFORMIN (GLUCOPHAGE-XR) 500 MG 24 hr tablet TAKE 3 TABLETS EVERY MORNING 270 tablet 0  . predniSONE (DELTASONE) 10 MG tablet 2 tabs by mouth per day for 5 days 10 tablet 0   No current facility-administered medications on file prior to visit.    Review of Systems  Constitutional: Negative for other unusual diaphoresis or sweats HENT: Negative for ear discharge or swelling Eyes: Negative for other worsening  visual disturbances Respiratory: Negative for stridor or other swelling  Gastrointestinal: Negative for worsening distension or other blood Genitourinary: Negative for retention or other urinary change Musculoskeletal: Negative for other MSK pain or swelling Skin: Negative for color change or other new lesions Neurological: Negative for worsening tremors and other numbness  Psychiatric/Behavioral: Negative for worsening agitation or other fatigue All other system neg per pt    Objective:   Physical Exam BP 122/78   Pulse (!) 109   Temp 100 F (37.8 C) (Oral)   Ht 5\' 11"  (1.803 m)   Wt (!) 312 lb (141.5 kg)   SpO2 95%   BMI 43.52 kg/m  VS noted, mild ill Constitutional: Pt appears in NAD HENT: Head: NCAT.  Right Ear: External ear normal.  Left Ear: External ear normal.  Bilat tm's with mild erythema.  Max sinus areas mild tender.  Pharynx with mild erythema, no exudate Eyes: . Pupils are equal, round, and reactive to light. Conjunctivae and EOM are normal Nose: without d/c or deformity Neck: Neck supple. Gross normal ROM Cardiovascular: Normal rate and regular rhythm.   Pulmonary/Chest: Effort normal and breath sounds without rales or wheezing.  Neurological: Pt is alert. At baseline orientation, motor grossly intact Skin: Skin is warm. No rashes, other new lesions, no LE edema Psychiatric: Pt behavior is normal without agitation  No other exam findings Lab Results  Component Value Date   WBC 8.6 02/25/2017   HGB 14.2 02/25/2017   HCT 42.4 02/25/2017  PLT 236.0 02/25/2017   GLUCOSE 161 (H) 02/25/2017   CHOL 135 02/25/2017   TRIG 114.0 02/25/2017   HDL 32.30 (L) 02/25/2017   LDLDIRECT 86.5 11/04/2011   LDLCALC 80 02/25/2017   ALT 17 02/25/2017   AST 14 02/25/2017   NA 139 02/25/2017   K 4.8 02/25/2017   CL 102 02/25/2017   CREATININE 1.00 02/25/2017   BUN 11 02/25/2017   CO2 29 02/25/2017   TSH 1.07 02/25/2017   PSA 0.29 02/25/2017   HGBA1C 6.5 09/16/2017    MICROALBUR 0.9 10/28/2014       Assessment & Plan:

## 2018-09-26 NOTE — Patient Instructions (Signed)
Please take all new medication as prescribed - the antibiotic, and cough medicine as needed  Please continue all other medications as before, and refills have been done if requested.  Please have the pharmacy call with any other refills you may need.  Please continue your efforts at being more active, low cholesterol diet, and weight control.  Please keep your appointments with your specialists as you may have planned   

## 2018-11-02 ENCOUNTER — Encounter: Payer: Self-pay | Admitting: Internal Medicine

## 2018-11-02 ENCOUNTER — Other Ambulatory Visit (INDEPENDENT_AMBULATORY_CARE_PROVIDER_SITE_OTHER): Payer: Managed Care, Other (non HMO)

## 2018-11-02 ENCOUNTER — Ambulatory Visit: Payer: Managed Care, Other (non HMO) | Admitting: Internal Medicine

## 2018-11-02 VITALS — BP 144/88 | HR 96 | Temp 98.3°F | Ht 71.0 in | Wt 315.0 lb

## 2018-11-02 DIAGNOSIS — E119 Type 2 diabetes mellitus without complications: Secondary | ICD-10-CM

## 2018-11-02 DIAGNOSIS — Z125 Encounter for screening for malignant neoplasm of prostate: Secondary | ICD-10-CM

## 2018-11-02 DIAGNOSIS — Z0000 Encounter for general adult medical examination without abnormal findings: Secondary | ICD-10-CM

## 2018-11-02 DIAGNOSIS — K429 Umbilical hernia without obstruction or gangrene: Secondary | ICD-10-CM | POA: Insufficient documentation

## 2018-11-02 DIAGNOSIS — I10 Essential (primary) hypertension: Secondary | ICD-10-CM

## 2018-11-02 LAB — URINALYSIS, ROUTINE W REFLEX MICROSCOPIC
Bilirubin Urine: NEGATIVE
Ketones, ur: NEGATIVE
Leukocytes, UA: NEGATIVE
Nitrite: NEGATIVE
Specific Gravity, Urine: >=1.03 — AB (ref 1.000–1.030)
Total Protein, Urine: NEGATIVE
Urine Glucose: 250 — AB
Urobilinogen, UA: 0.2 (ref 0.0–1.0)
pH: 6 (ref 5.0–8.0)

## 2018-11-02 LAB — MICROALBUMIN / CREATININE URINE RATIO
Creatinine,U: 214.3 mg/dL
Microalb Creat Ratio: 0.4 mg/g (ref 0.0–30.0)
Microalb, Ur: 0.8 mg/dL (ref 0.0–1.9)

## 2018-11-02 LAB — LDL CHOLESTEROL, DIRECT: Direct LDL: 88 mg/dL

## 2018-11-02 LAB — CBC WITH DIFFERENTIAL/PLATELET
Basophils Absolute: 0.1 10*3/uL (ref 0.0–0.1)
Basophils Relative: 0.5 % (ref 0.0–3.0)
Eosinophils Absolute: 0.4 10*3/uL (ref 0.0–0.7)
Eosinophils Relative: 3.5 % (ref 0.0–5.0)
HCT: 42.9 % (ref 39.0–52.0)
HEMOGLOBIN: 14.4 g/dL (ref 13.0–17.0)
Lymphocytes Relative: 25.7 % (ref 12.0–46.0)
Lymphs Abs: 2.8 10*3/uL (ref 0.7–4.0)
MCHC: 33.5 g/dL (ref 30.0–36.0)
MCV: 87.7 fl (ref 78.0–100.0)
Monocytes Absolute: 0.6 10*3/uL (ref 0.1–1.0)
Monocytes Relative: 6 % (ref 3.0–12.0)
Neutro Abs: 6.9 10*3/uL (ref 1.4–7.7)
Neutrophils Relative %: 64.3 % (ref 43.0–77.0)
Platelets: 262 10*3/uL (ref 150.0–400.0)
RBC: 4.89 Mil/uL (ref 4.22–5.81)
RDW: 13.7 % (ref 11.5–15.5)
WBC: 10.8 10*3/uL — AB (ref 4.0–10.5)

## 2018-11-02 LAB — HEPATIC FUNCTION PANEL
ALT: 21 U/L (ref 0–53)
AST: 15 U/L (ref 0–37)
Albumin: 4.1 g/dL (ref 3.5–5.2)
Alkaline Phosphatase: 46 U/L (ref 39–117)
Bilirubin, Direct: 0.1 mg/dL (ref 0.0–0.3)
Total Bilirubin: 0.4 mg/dL (ref 0.2–1.2)
Total Protein: 6.7 g/dL (ref 6.0–8.3)

## 2018-11-02 LAB — LIPID PANEL
Cholesterol: 141 mg/dL (ref 0–200)
HDL: 27.3 mg/dL — AB (ref >39.00)
NonHDL: 113.65
Total CHOL/HDL Ratio: 5
Triglycerides: 211 mg/dL — ABNORMAL HIGH (ref 0.0–149.0)
VLDL: 42.2 mg/dL — ABNORMAL HIGH (ref 0.0–40.0)

## 2018-11-02 LAB — PSA: PSA: 0.45 ng/mL (ref 0.10–4.00)

## 2018-11-02 LAB — BASIC METABOLIC PANEL
BUN: 13 mg/dL (ref 6–23)
CO2: 30 mEq/L (ref 19–32)
Calcium: 9.2 mg/dL (ref 8.4–10.5)
Chloride: 102 mEq/L (ref 96–112)
Creatinine, Ser: 0.9 mg/dL (ref 0.40–1.50)
GFR: 87.23 mL/min (ref >60.00)
GLUCOSE: 134 mg/dL — AB (ref 70–99)
Potassium: 4.2 mEq/L (ref 3.5–5.1)
SODIUM: 139 meq/L (ref 135–145)

## 2018-11-02 LAB — HEMOGLOBIN A1C: Hgb A1c MFr Bld: 7.2 % — ABNORMAL HIGH (ref 4.6–6.5)

## 2018-11-02 LAB — TSH: TSH: 1.25 u[IU]/mL (ref 0.35–4.50)

## 2018-11-02 NOTE — Progress Notes (Signed)
Subjective:    Patient ID: Martin Lozano, male    DOB: 1961-03-23, 58 y.o.   MRN: [id number]  HPI  Here with c/o 1 mo onset mild intermittent periumbilical dull pain with a firm swelling above the umbilicus, tender to touch, but Denies worsening reflux, other abd pain, dysphagia, n/v, bowel change or blood.   Pt denies fever, wt loss, night sweats, loss of appetite, or other constitutional symptoms  Has not done heavy lifting but semed to start after gained wt, then one day was bending to the side.  Pt denies chest pain, increased sob or doe, wheezing, orthopnea, PND, increased LE swelling, palpitations, dizziness or syncope.  Pt denies new neurological symptoms such as new headache, or facial or extremity weakness or numbness   Pt denies polydipsia, polyuria, or low sugar symptoms such as weakness or confusion improved with po intake.  Pt states overall good compliance with meds, not really lately trying to follow lower cholesterol, diabetic diet. Past Medical History:  Diagnosis Date  . ALLERGIC RHINITIS 09/06/2007  . ASTHMATIC BRONCHITIS, ACUTE 11/16/2007  . HEMORRHOIDS 07/03/2007  . HYPERLIPIDEMIA 09/06/2007  . HYPERTENSION 07/03/2007  . KNEE SPRAIN, LEFT 07/30/2008  . OBESITY, MORBID 07/03/2007  . Other tenosynovitis of hand and wrist 06/11/2010  . OTITIS MEDIA, ACUTE, LEFT 11/10/2009  . Type II or unspecified type diabetes mellitus without mention of complication, uncontrolled 09/25/2012  . Wheezing 09/09/2009   No past surgical history on file.  reports that he has never smoked. He has never used smokeless tobacco. He reports current alcohol use. He reports that he does not use drugs. family history is not on file. No Known Allergies Current Outpatient Medications on File Prior to Visit  Medication Sig Dispense Refill  . aspirin 81 MG tablet Take 81 mg by mouth daily.      . CRESTOR 20 MG tablet TAKE 1 TABLET EVERY DAY 270 tablet 1  . lisinopril (PRINIVIL,ZESTRIL) 20 MG tablet TAKE 1  TABLET EVERY DAY 90 tablet 1  . metFORMIN (GLUCOPHAGE-XR) 500 MG 24 hr tablet TAKE 3 TABLETS EVERY MORNING 270 tablet 0   No current facility-administered medications on file prior to visit.    Review of Systems  Constitutional: Negative for other unusual diaphoresis or sweats HENT: Negative for ear discharge or swelling Eyes: Negative for other worsening visual disturbances Respiratory: Negative for stridor or other swelling  Gastrointestinal: Negative for worsening distension or other blood Genitourinary: Negative for retention or other urinary change Musculoskeletal: Negative for other MSK pain or swelling Skin: Negative for color change or other new lesions Neurological: Negative for worsening tremors and other numbness  Psychiatric/Behavioral: Negative for worsening agitation or other fatigue All other system neg per pt    Objective:   Physical Exam BP (!) 144/88   Pulse 96   Temp 98.3 F (36.8 C) (Oral)   Ht 5\' 11"  (1.803 m)   Wt (!) 315 lb (142.9 kg)   SpO2 96%   BMI 43.93 kg/m  VS noted,  Constitutional: Pt appears in NAD HENT: Head: NCAT.  Right Ear: External ear normal.  Left Ear: External ear normal.  Eyes: . Pupils are equal, round, and reactive to light. Conjunctivae and EOM are normal Nose: without d/c or deformity Neck: Neck supple. Gross normal ROM Cardiovascular: Normal rate and regular rhythm.   Pulmonary/Chest: Effort normal and breath sounds without rales or wheezing.  Abd:  Soft, ND, + BS, no organomegaly with 12 oclock umbilical herniation, firm tender but  reducible Neurological: Pt is alert. At baseline orientation, motor grossly intact Skin: Skin is warm. No rashes, other new lesions, no LE edema Psychiatric: Pt behavior is normal without agitation  No other exam findings  Lab Results  Component Value Date   WBC 8.6 02/25/2017   HGB 14.2 02/25/2017   HCT 42.4 02/25/2017   PLT 236.0 02/25/2017   GLUCOSE 161 (H) 02/25/2017   CHOL 135  02/25/2017   TRIG 114.0 02/25/2017   HDL 32.30 (L) 02/25/2017   LDLDIRECT 86.5 11/04/2011   LDLCALC 80 02/25/2017   ALT 17 02/25/2017   AST 14 02/25/2017   NA 139 02/25/2017   K 4.8 02/25/2017   CL 102 02/25/2017   CREATININE 1.00 02/25/2017   BUN 11 02/25/2017   CO2 29 02/25/2017   TSH 1.07 02/25/2017   PSA 0.29 02/25/2017   HGBA1C 6.5 09/16/2017   MICROALBUR 0.9 10/28/2014       Assessment & Plan:

## 2018-11-02 NOTE — Assessment & Plan Note (Signed)
stable overall by history and exam, recent data reviewed with pt, and pt to continue medical treatment as before,  to f/u any worsening symptoms or concerns  

## 2018-11-02 NOTE — Assessment & Plan Note (Addendum)
Mild reducible, d/w pt, declines referral but will call if wants to general surgury to see, advised no heavy lifting or bending; and to go to ED for any worsening

## 2018-11-02 NOTE — Patient Instructions (Signed)
Please call if you change your mind about a referral to General Surgury  Please go to the ER for any episode of pain severe and tender that cannot be resolved by pushing in  Please continue all other medications as before, and refills have been done if requested.  Please have the pharmacy call with any other refills you may need.  Please continue your efforts at being more active, low cholesterol diet, and weight control.  Please keep your appointments with your specialists as you may have planned  Please go to the LAB in the Basement (turn left off the elevator) for the tests to be done today  You will be contacted by phone if any changes need to be made immediately.  Otherwise, you will receive a letter about your results with an explanation, but please check with MyChart first.  Please remember to sign up for MyChart if you have not done so, as this will be important to you in the future with finding out test results, communicating by private email, and scheduling acute appointments online when needed.  Please return next week for your yearly physical as you mentioned

## 2018-11-04 ENCOUNTER — Other Ambulatory Visit: Payer: Self-pay | Admitting: Internal Medicine

## 2018-11-05 ENCOUNTER — Other Ambulatory Visit: Payer: Self-pay | Admitting: Internal Medicine

## 2018-11-06 MED ORDER — METFORMIN HCL ER 500 MG PO TB24: ORAL_TABLET | ORAL | 1 refills | Status: DC

## 2018-11-22 ENCOUNTER — Ambulatory Visit (INDEPENDENT_AMBULATORY_CARE_PROVIDER_SITE_OTHER): Payer: Managed Care, Other (non HMO) | Admitting: Internal Medicine

## 2018-11-22 ENCOUNTER — Encounter: Payer: Self-pay | Admitting: Internal Medicine

## 2018-11-22 VITALS — BP 124/76 | HR 90 | Temp 98.3°F | Ht 71.0 in | Wt 316.0 lb

## 2018-11-22 DIAGNOSIS — Z23 Encounter for immunization: Secondary | ICD-10-CM

## 2018-11-22 DIAGNOSIS — Z0000 Encounter for general adult medical examination without abnormal findings: Secondary | ICD-10-CM

## 2018-11-22 DIAGNOSIS — Z114 Encounter for screening for human immunodeficiency virus [HIV]: Secondary | ICD-10-CM | POA: Diagnosis not present

## 2018-11-22 DIAGNOSIS — E119 Type 2 diabetes mellitus without complications: Secondary | ICD-10-CM | POA: Diagnosis not present

## 2018-11-22 NOTE — Progress Notes (Signed)
Subjective:    Patient ID: Martin Lozano, male    DOB: [birth year], 58 y.o.   MRN: [id number]  HPI  Here for wellness and f/u;  Overall doing ok;  Pt denies Chest pain, worsening SOB, DOE, wheezing, orthopnea, PND, worsening LE edema, palpitations, dizziness or syncope.  Pt denies neurological change such as new headache, facial or extremity weakness.  Pt denies polydipsia, polyuria, or low sugar symptoms. Pt states overall good compliance with treatment and medications, good tolerability, and has been trying to follow appropriate diet.  Pt denies worsening depressive symptoms, suicidal ideation or panic. No fever, night sweats, wt loss, loss of appetite, or other constitutional symptoms.  Pt states good ability with ADL's, has low fall risk, home safety reviewed and adequate, no other significant changes in hearing or vision, and only occasionally active with exercise. Plans to stop drinking sugar sodas to get wt loss Wt Readings from Last 3 Encounters:  11/22/18 (!) 316 lb (143.3 kg)  11/02/18 (!) 315 lb (142.9 kg)  09/26/18 (!) 312 lb (141.5 kg)   BP Readings from Last 3 Encounters:  11/22/18 124/76  11/02/18 (!) 144/88  09/26/18 122/78  Plans to call for eye doctor appt soon. Due for pneumovax  Umbilical hernia asympt as long as he only lies down and sits up by turning on his right side Past Medical History:  Diagnosis Date  . ALLERGIC RHINITIS 09/06/2007  . ASTHMATIC BRONCHITIS, ACUTE 11/16/2007  . HEMORRHOIDS 07/03/2007  . HYPERLIPIDEMIA 09/06/2007  . HYPERTENSION 07/03/2007  . KNEE SPRAIN, LEFT 07/30/2008  . OBESITY, MORBID 07/03/2007  . Other tenosynovitis of hand and wrist 06/11/2010  . OTITIS MEDIA, ACUTE, LEFT 11/10/2009  . Type II or unspecified type diabetes mellitus without mention of complication, uncontrolled 09/25/2012  . Wheezing 09/09/2009   No past surgical history on file.  reports that he has never smoked. He has never used smokeless tobacco. He reports current alcohol  use. He reports that he does not use drugs. family history is not on file. No Known Allergies Current Outpatient Medications on File Prior to Visit  Medication Sig Dispense Refill  . aspirin 81 MG tablet Take 81 mg by mouth daily.      . CRESTOR 20 MG tablet TAKE 1 TABLET EVERY DAY 270 tablet 1  . lisinopril (PRINIVIL,ZESTRIL) 20 MG tablet TAKE 1 TABLET EVERY DAY 90 tablet 1  . metFORMIN (GLUCOPHAGE-XR) 500 MG 24 hr tablet TAKE 3 TABLETS EVERY MORNING 270 tablet 0  . metFORMIN (GLUCOPHAGE-XR) 500 MG 24 hr tablet TAKE 3 TABLETS EVERY MORNING 270 tablet 1   No current facility-administered medications on file prior to visit.    Review of Systems Constitutional: Negative for other unusual diaphoresis, sweats, appetite or weight changes HENT: Negative for other worsening hearing loss, ear pain, facial swelling, mouth sores or neck stiffness.   Eyes: Negative for other worsening pain, redness or other visual disturbance.  Respiratory: Negative for other stridor or swelling Cardiovascular: Negative for other palpitations or other chest pain  Gastrointestinal: Negative for worsening diarrhea or loose stools, blood in stool, distention or other pain Genitourinary: Negative for hematuria, flank pain or other change in urine volume.  Musculoskeletal: Negative for myalgias or other joint swelling.  Skin: Negative for other color change, or other wound or worsening drainage.  Neurological: Negative for other syncope or numbness. Hematological: Negative for other adenopathy or swelling Psychiatric/Behavioral: Negative for hallucinations, other worsening agitation, SI, self-injury, or new decreased concentration All other system neg  per pt    Objective:   Physical Exam BP 124/76   Pulse 90   Temp 98.3 F (36.8 C) (Oral)   Ht 5\' 11"  (1.803 m)   Wt (!) 316 lb (143.3 kg)   SpO2 97%   BMI 44.07 kg/m  VS noted,  Constitutional: Pt is oriented to person, place, and time. Appears well-developed  and well-nourished, in no significant distress and comfortable Head: Normocephalic and atraumatic  Eyes: Conjunctivae and EOM are normal. Pupils are equal, round, and reactive to light Right Ear: External ear normal without discharge Left Ear: External ear normal without discharge Nose: Nose without discharge or deformity Mouth/Throat: Oropharynx is without other ulcerations and moist  Neck: Normal range of motion. Neck supple. No JVD present. No tracheal deviation present or significant neck LA or mass Cardiovascular: Normal rate, regular rhythm, normal heart sounds and intact distal pulses.   Pulmonary/Chest: WOB normal and breath sounds without rales or wheezing  Abdominal: Soft. Bowel sounds are normal. NT. No HSM  Musculoskeletal: Normal range of motion. Exhibits no edema Lymphadenopathy: Has no other cervical adenopathy.  Neurological: Pt is alert and oriented to person, place, and time. Pt has normal reflexes. No cranial nerve deficit. Motor grossly intact, Gait intact Skin: Skin is warm and dry. No rash noted or new ulcerations Psychiatric:  Has normal mood and affect. Behavior is normal without agitation No other exam findings Lab Results  Component Value Date   WBC 10.8 (H) 11/02/2018   HGB 14.4 11/02/2018   HCT 42.9 11/02/2018   PLT 262.0 11/02/2018   GLUCOSE 134 (H) 11/02/2018   CHOL 141 11/02/2018   TRIG 211.0 (H) 11/02/2018   HDL 27.30 (L) 11/02/2018   LDLDIRECT 88.0 11/02/2018   LDLCALC 80 02/25/2017   ALT 21 11/02/2018   AST 15 11/02/2018   NA 139 11/02/2018   K 4.2 11/02/2018   CL 102 11/02/2018   CREATININE 0.90 11/02/2018   BUN 13 11/02/2018   CO2 30 11/02/2018   TSH 1.25 11/02/2018   PSA 0.45 11/02/2018   HGBA1C 7.2 (H) 11/02/2018   MICROALBUR 0.8 11/02/2018       Assessment & Plan:

## 2018-11-22 NOTE — Patient Instructions (Addendum)
You had the Pneumovax pneumonia shot today  Please continue all other medications as before, and refills have been done if requested.  Please have the pharmacy call with any other refills you may need.  Please continue your efforts at being more active, low cholesterol diet, and weight control.  You are otherwise up to date with prevention measures today.  Please keep your appointments with your specialists as you may have planned  Please return in 6 months, or sooner if needed, with Lab testing done 3-5 days before  

## 2018-11-22 NOTE — Assessment & Plan Note (Signed)
stable overall by history and exam, recent data reviewed with pt, and pt to continue medical treatment as before,  to f/u any worsening symptoms or concerns  

## 2018-11-22 NOTE — Assessment & Plan Note (Signed)

## 2019-01-20 ENCOUNTER — Other Ambulatory Visit: Payer: Self-pay | Admitting: Internal Medicine

## 2019-01-26 ENCOUNTER — Other Ambulatory Visit: Payer: Self-pay | Admitting: Internal Medicine

## 2019-04-20 ENCOUNTER — Other Ambulatory Visit: Payer: Self-pay | Admitting: Internal Medicine

## 2019-04-20 MED ORDER — METFORMIN HCL 500 MG PO TABS: ORAL_TABLET | ORAL | 3 refills | Status: DC

## 2019-04-20 NOTE — Telephone Encounter (Signed)
Left detailed message informing pt of below.  

## 2019-04-20 NOTE — Telephone Encounter (Signed)
Ok to let pt know - Thanks for your inquiry about the concern related to a possible cancer causing contaminant to the metformin ER.   We will simply need to change your metformin ER to the regular metformin as there has been no concern about this with the regular metformin.  We will send a new prescription, and simply start this when you are able instead.   

## 2019-07-09 ENCOUNTER — Other Ambulatory Visit: Payer: Self-pay

## 2019-07-09 ENCOUNTER — Encounter: Payer: Self-pay | Admitting: Internal Medicine

## 2019-07-09 ENCOUNTER — Ambulatory Visit (INDEPENDENT_AMBULATORY_CARE_PROVIDER_SITE_OTHER): Payer: Managed Care, Other (non HMO) | Admitting: Internal Medicine

## 2019-07-09 DIAGNOSIS — Z20822 Contact with and (suspected) exposure to covid-19: Secondary | ICD-10-CM

## 2019-07-09 DIAGNOSIS — Z20828 Contact with and (suspected) exposure to other viral communicable diseases: Secondary | ICD-10-CM

## 2019-07-09 MED ORDER — BENZONATATE 200 MG PO CAPS: 200.0000 mg | ORAL_CAPSULE | Freq: Three times a day (TID) | ORAL | 0 refills | Status: DC | PRN

## 2019-07-09 NOTE — Progress Notes (Signed)
Virtual Visit via Video Note  I connected with Martin Lozano on 07/09/19 at  1:20 PM EDT by a video enabled telemedicine application and verified that I am speaking with the correct person using two identifiers.  The patient and the provider were at separate locations throughout the entire encounter.   I discussed the limitations of evaluation and management by telemedicine and the availability of in person appointments. The patient expressed understanding and agreed to proceed.  History of Present Illness: The patient is a 58 y.o. man with visit for concerns about fever to 101 F and aches and pain and cough. Started Friday/Saturday. Has been worsening overall. Some cough also. Denies SOB. Denies headaches or nasal drainage. Overall it is worsening. Has tried otc cough medicine without relief.  Observations/Objective: Appearance: obese, breathing appears normal, minimal cough during visit, casual grooming, abdomen does not appear distended, throat not well visualized, memory normal, mental status is A and O times 3  Assessment and Plan: See problem oriented charting  Follow Up Instructions: covid-19 testing ordered, rx tessalon perles  Visit time 25 minutes: greater than 50% of that time was spent in face to face counseling and coordination of care with the patient: counseled about covid-19 testing and treatment if positive as well as need for quarantine and contact tracing if positive as well as alternate etiology if negative  I discussed the assessment and treatment plan with the patient. The patient was provided an opportunity to ask questions and all were answered. The patient agreed with the plan and demonstrated an understanding of the instructions.   The patient was advised to call back or seek an in-person evaluation if the symptoms worsen or if the condition fails to improve as anticipated.  Hoyt Koch, MD

## 2019-07-10 ENCOUNTER — Other Ambulatory Visit: Payer: Self-pay

## 2019-07-10 DIAGNOSIS — Z20822 Contact with and (suspected) exposure to covid-19: Secondary | ICD-10-CM

## 2019-07-10 DIAGNOSIS — U071 COVID-19: Secondary | ICD-10-CM | POA: Insufficient documentation

## 2019-07-10 NOTE — Assessment & Plan Note (Signed)
Discussed quarantining until results return. Ordered covid-19 test and tessalon perles for cough.

## 2019-07-11 ENCOUNTER — Encounter: Payer: Self-pay | Admitting: Internal Medicine

## 2019-07-12 ENCOUNTER — Other Ambulatory Visit: Payer: Self-pay | Admitting: Internal Medicine

## 2019-07-12 LAB — NOVEL CORONAVIRUS, NAA: SARS-CoV-2, NAA: DETECTED — AB

## 2019-07-12 MED ORDER — PROMETHAZINE-DM 6.25-15 MG/5ML PO SYRP: 5.0000 mL | ORAL_SOLUTION | Freq: Four times a day (QID) | ORAL | 0 refills | Status: DC | PRN

## 2019-07-16 ENCOUNTER — Encounter: Payer: Self-pay | Admitting: Internal Medicine

## 2019-07-16 ENCOUNTER — Ambulatory Visit (INDEPENDENT_AMBULATORY_CARE_PROVIDER_SITE_OTHER): Payer: Managed Care, Other (non HMO) | Admitting: Internal Medicine

## 2019-07-16 DIAGNOSIS — U071 COVID-19: Secondary | ICD-10-CM

## 2019-07-16 MED ORDER — HYDROCODONE-HOMATROPINE 5-1.5 MG/5ML PO SYRP: 5.0000 mL | ORAL_SOLUTION | Freq: Three times a day (TID) | ORAL | 0 refills | Status: DC | PRN

## 2019-07-16 NOTE — Assessment & Plan Note (Signed)
Advised about ending self-isolation. He does not meet criteria for that today. Rx for hycodan cough syrup and reviewed [place] narcotic database and no inappropriate.

## 2019-07-16 NOTE — Progress Notes (Signed)
Virtual Visit via Video Note  I connected with Martin Lozano on 07/16/19 at  1:40 PM EDT by a video enabled telemedicine application and verified that I am speaking with the correct person using two identifiers.  The patient and the provider were at separate locations throughout the entire encounter.   I discussed the limitations of evaluation and management by telemedicine and the availability of in person appointments. The patient expressed understanding and agreed to proceed. The patient and the provider were the only parties present for the visit unless noted in HPI below.  History of Present Illness: The patient is a 58 y.o. man with visit for questions about covid-19. He was diagnosed with covid-19 last week. Still having mild SOB and cough. Some feeling of cold in his lungs with coughing and when lying flat. Still having fevers to mid 75F today this morning. Denies body aches. Still feeling very fatigued and ill. Overall improving mildly. Started about 1 week or so ago. Has tried tessalon and promethazine cough medicine which has not helped.   Observations/Objective: Appearance: appears ill, breathing appears normal, some cough during visit not productive, casual grooming, abdomen does not appear distended, throat not visualized, mental status is A and O times 3  Assessment and Plan: See problem oriented charting  Follow Up Instructions: rx hycodan cough syrup and education provided about covid-19 and when it is safe to end self-isolation.  Visit time 25 minutes: greater than 50% of that time was spent in face to face counseling and coordination of care with the patient: counseled about as above  I discussed the assessment and treatment plan with the patient. The patient was provided an opportunity to ask questions and all were answered. The patient agreed with the plan and demonstrated an understanding of the instructions.   The patient was advised to call back or seek an in-person  evaluation if the symptoms worsen or if the condition fails to improve as anticipated.  Hoyt Koch, MD

## 2019-07-19 ENCOUNTER — Encounter (HOSPITAL_COMMUNITY): Payer: Self-pay | Admitting: Emergency Medicine

## 2019-07-19 ENCOUNTER — Encounter: Payer: Self-pay | Admitting: Internal Medicine

## 2019-07-19 ENCOUNTER — Inpatient Hospital Stay (HOSPITAL_COMMUNITY)
Admission: EM | Admit: 2019-07-19 | Discharge: 2019-07-23 | DRG: 177 | Disposition: A | Discharge status: Home / Self Care | Payer: Managed Care, Other (non HMO) | Attending: Internal Medicine | Admitting: Internal Medicine

## 2019-07-19 ENCOUNTER — Emergency Department (HOSPITAL_COMMUNITY): Payer: Managed Care, Other (non HMO)

## 2019-07-19 DIAGNOSIS — U071 COVID-19: Secondary | ICD-10-CM | POA: Diagnosis present

## 2019-07-19 DIAGNOSIS — Z6841 Body Mass Index (BMI) 40.0 and over, adult: Secondary | ICD-10-CM

## 2019-07-19 DIAGNOSIS — T8061XA Other serum reaction due to administration of blood and blood products, initial encounter: Secondary | ICD-10-CM | POA: Diagnosis not present

## 2019-07-19 DIAGNOSIS — Z7982 Long term (current) use of aspirin: Secondary | ICD-10-CM

## 2019-07-19 DIAGNOSIS — R197 Diarrhea, unspecified: Secondary | ICD-10-CM | POA: Diagnosis present

## 2019-07-19 DIAGNOSIS — J45909 Unspecified asthma, uncomplicated: Secondary | ICD-10-CM | POA: Diagnosis present

## 2019-07-19 DIAGNOSIS — J1289 Other viral pneumonia: Secondary | ICD-10-CM | POA: Diagnosis present

## 2019-07-19 DIAGNOSIS — R0902 Hypoxemia: Secondary | ICD-10-CM

## 2019-07-19 DIAGNOSIS — Y92230 Patient room in hospital as the place of occurrence of the external cause: Secondary | ICD-10-CM | POA: Diagnosis not present

## 2019-07-19 DIAGNOSIS — E785 Hyperlipidemia, unspecified: Secondary | ICD-10-CM | POA: Diagnosis present

## 2019-07-19 DIAGNOSIS — L500 Allergic urticaria: Secondary | ICD-10-CM | POA: Diagnosis not present

## 2019-07-19 DIAGNOSIS — T458X5A Adverse effect of other primarily systemic and hematological agents, initial encounter: Secondary | ICD-10-CM | POA: Diagnosis not present

## 2019-07-19 DIAGNOSIS — J9601 Acute respiratory failure with hypoxia: Secondary | ICD-10-CM | POA: Diagnosis present

## 2019-07-19 DIAGNOSIS — I10 Essential (primary) hypertension: Secondary | ICD-10-CM | POA: Diagnosis present

## 2019-07-19 DIAGNOSIS — Z79899 Other long term (current) drug therapy: Secondary | ICD-10-CM | POA: Diagnosis not present

## 2019-07-19 DIAGNOSIS — Z7984 Long term (current) use of oral hypoglycemic drugs: Secondary | ICD-10-CM | POA: Diagnosis not present

## 2019-07-19 DIAGNOSIS — E119 Type 2 diabetes mellitus without complications: Secondary | ICD-10-CM | POA: Diagnosis present

## 2019-07-19 DIAGNOSIS — Z23 Encounter for immunization: Secondary | ICD-10-CM

## 2019-07-19 DIAGNOSIS — L299 Pruritus, unspecified: Secondary | ICD-10-CM | POA: Diagnosis not present

## 2019-07-19 LAB — CBC WITH DIFFERENTIAL/PLATELET
Abs Immature Granulocytes: 0.03 10*3/uL (ref 0.00–0.07)
Basophils Absolute: 0 10*3/uL (ref 0.0–0.1)
Basophils Relative: 0 %
Eosinophils Absolute: 0 10*3/uL (ref 0.0–0.5)
Eosinophils Relative: 0 %
HCT: 44.7 % (ref 39.0–52.0)
Hemoglobin: 14.8 g/dL (ref 13.0–17.0)
Immature Granulocytes: 0 %
Lymphocytes Relative: 10 %
Lymphs Abs: 0.8 10*3/uL (ref 0.7–4.0)
MCH: 28.8 pg (ref 26.0–34.0)
MCHC: 33.1 g/dL (ref 30.0–36.0)
MCV: 87 fL (ref 80.0–100.0)
Monocytes Absolute: 0.4 10*3/uL (ref 0.1–1.0)
Monocytes Relative: 5 %
Neutro Abs: 6.6 10*3/uL (ref 1.7–7.7)
Neutrophils Relative %: 85 %
Platelets: 320 10*3/uL (ref 150–400)
RBC: 5.14 MIL/uL (ref 4.22–5.81)
RDW: 12.8 % (ref 11.5–15.5)
WBC: 7.8 10*3/uL (ref 4.0–10.5)
nRBC: 0 % (ref 0.0–0.2)

## 2019-07-19 LAB — COMPREHENSIVE METABOLIC PANEL
ALT: 28 U/L (ref 0–44)
AST: 28 U/L (ref 15–41)
Albumin: 3.2 g/dL — ABNORMAL LOW (ref 3.5–5.0)
Alkaline Phosphatase: 47 U/L (ref 38–126)
Anion gap: 15 (ref 5–15)
BUN: 15 mg/dL (ref 6–20)
CO2: 22 mmol/L (ref 22–32)
Calcium: 8.5 mg/dL — ABNORMAL LOW (ref 8.9–10.3)
Chloride: 100 mmol/L (ref 98–111)
Creatinine, Ser: 0.94 mg/dL (ref 0.61–1.24)
GFR calc Af Amer: >60 mL/min (ref >60)
GFR calc non Af Amer: >60 mL/min (ref >60)
Glucose, Bld: 132 mg/dL — ABNORMAL HIGH (ref 70–99)
Potassium: 3.9 mmol/L (ref 3.5–5.1)
Sodium: 137 mmol/L (ref 135–145)
Total Bilirubin: 1 mg/dL (ref 0.3–1.2)
Total Protein: 7.8 g/dL (ref 6.5–8.1)

## 2019-07-19 LAB — ABO/RH: ABO/RH(D): O POS

## 2019-07-19 LAB — LACTIC ACID, PLASMA
Lactic Acid, Venous: 1.4 mmol/L (ref 0.5–1.9)
Lactic Acid, Venous: 1.4 mmol/L (ref 0.5–1.9)

## 2019-07-19 LAB — LACTATE DEHYDROGENASE: LDH: 231 U/L — ABNORMAL HIGH (ref 98–192)

## 2019-07-19 LAB — D-DIMER, QUANTITATIVE: D-Dimer, Quant: 1.29 ug/mL-FEU — ABNORMAL HIGH (ref 0.00–0.50)

## 2019-07-19 LAB — TRIGLYCERIDES: Triglycerides: 218 mg/dL — ABNORMAL HIGH (ref <150)

## 2019-07-19 LAB — PROCALCITONIN: Procalcitonin: <0.1 ng/mL

## 2019-07-19 LAB — FIBRINOGEN: Fibrinogen: >800 mg/dL — ABNORMAL HIGH (ref 210–475)

## 2019-07-19 LAB — C-REACTIVE PROTEIN: CRP: 11.7 mg/dL — ABNORMAL HIGH (ref <1.0)

## 2019-07-19 LAB — FERRITIN: Ferritin: 1289 ng/mL — ABNORMAL HIGH (ref 24–336)

## 2019-07-19 MED ORDER — ACETAMINOPHEN 325 MG PO TABS: 650.0000 mg | ORAL_TABLET | Freq: Four times a day (QID) | ORAL | Status: DC | PRN

## 2019-07-19 MED ORDER — INSULIN ASPART 100 UNIT/ML ~~LOC~~ SOLN
0.0000 [IU] | Freq: Every day | SUBCUTANEOUS | Status: DC
  Administered 2019-07-20: 21:00:00 2 [IU] via SUBCUTANEOUS
  Filled 2019-07-19: qty 0.05

## 2019-07-19 MED ORDER — DEXAMETHASONE SODIUM PHOSPHATE 10 MG/ML IJ SOLN
6.0000 mg | INTRAMUSCULAR | Status: DC
  Administered 2019-07-20 – 2019-07-22 (×4): 6 mg via INTRAVENOUS
  Filled 2019-07-19 (×4): qty 1

## 2019-07-19 MED ORDER — SODIUM CHLORIDE 0.9 % IV SOLN
500.0000 mg | Freq: Every day | INTRAVENOUS | Status: DC
  Administered 2019-07-20: 02:00:00 500 mg via INTRAVENOUS
  Filled 2019-07-19: qty 500

## 2019-07-19 MED ORDER — SODIUM CHLORIDE 0.9 % IV SOLN
200.0000 mg | Freq: Once | INTRAVENOUS | Status: AC
  Administered 2019-07-19: 200 mg via INTRAVENOUS
  Filled 2019-07-19: qty 40

## 2019-07-19 MED ORDER — SODIUM CHLORIDE 0.9 % IV SOLN
100.0000 mg | INTRAVENOUS | Status: AC
  Administered 2019-07-20 – 2019-07-23 (×4): 100 mg via INTRAVENOUS
  Filled 2019-07-19 (×4): qty 20

## 2019-07-19 MED ORDER — ENOXAPARIN SODIUM 60 MG/0.6ML ~~LOC~~ SOLN
60.0000 mg | SUBCUTANEOUS | Status: DC
  Administered 2019-07-20 – 2019-07-23 (×4): 60 mg via SUBCUTANEOUS
  Filled 2019-07-19 (×4): qty 0.6

## 2019-07-19 MED ORDER — SODIUM CHLORIDE 0.9 % IV SOLN
1.0000 g | Freq: Every day | INTRAVENOUS | Status: DC
  Administered 2019-07-20: 02:00:00 1 g via INTRAVENOUS
  Filled 2019-07-19: qty 10

## 2019-07-19 MED ORDER — INSULIN ASPART 100 UNIT/ML ~~LOC~~ SOLN
0.0000 [IU] | Freq: Three times a day (TID) | SUBCUTANEOUS | Status: DC
  Administered 2019-07-20 (×3): 1 [IU] via SUBCUTANEOUS
  Administered 2019-07-21 (×3): 2 [IU] via SUBCUTANEOUS
  Administered 2019-07-22: 17:00:00 1 [IU] via SUBCUTANEOUS
  Administered 2019-07-22 – 2019-07-23 (×4): 2 [IU] via SUBCUTANEOUS
  Filled 2019-07-19: qty 0.09

## 2019-07-19 NOTE — ED Notes (Signed)
Sayner to give report, [facility] unavailable to take report.

## 2019-07-19 NOTE — H&P (Addendum)
TRH H&P    Patient Demographics:    Martin Lozano, is a 58 y.o. male  MRN: [id number]  DOB - [birth year]  Admit Date - 07/19/2019  Referring MD/NP/PA:  Ralph Leyden  Outpatient Primary MD for the patient is [name], MD  Patient coming from:  home  Chief complaint-  hypoxia   HPI:    Martin Lozano  is a 58 y.o. male,w hypertension, hyperlipidemia, Dm2, Asthma, presents with c/o covid symptoms since 07/06/2019.  Pt noted fever to 101, achiness and cough (dry).  Pt had televisit on 07/09/2019 and was told to self isolate and also given hycodan cough syrup.  Pt tested covid-19 positive on 07/10/2019.    Pt states that his fever has been better and didn't have fever today but noted dyspnea with exertion.  Pox in Ed 84% on RA.   In ED,  T 98.0  P 102  R 20, Bp 137/84  Pox 100% on o2 [place]  CXR IMPRESSION: Increased mid and lower lung mixed interstitial and peripheral airspace opacities concerning for developing pneumonia.  Na 137, K 3.9, Bun 15, Creatinine 0.94 Ast 28, Alt 28 Alb 3.2 LDH 231 Ferritin 1,289 Tg 218 Fibrinogen >800 Crp 11.7 D dimer 1.29 Blood culture x2  Pt will be admitted for covid-19. And cap.     Review of systems:    In addition to the HPI above,    No Headache, No changes with Vision or hearing, No problems swallowing food or Liquids, No Chest pain, No Abdominal pain, No Nausea or Vomiting, bowel movements are regular, No Blood in stool or Urine, No dysuria, No new skin rashes or bruises, No new joints pains-aches,  No new weakness, tingling, numbness in any extremity, No recent weight gain or loss, No polyuria, polydypsia or polyphagia, No significant Mental Stressors.  All other systems reviewed and are negative.    Past History of the following :    Past Medical History:  Diagnosis Date  . ALLERGIC RHINITIS 09/06/2007  . ASTHMATIC BRONCHITIS, ACUTE  11/16/2007  . HEMORRHOIDS 07/03/2007  . HYPERLIPIDEMIA 09/06/2007  . HYPERTENSION 07/03/2007  . KNEE SPRAIN, LEFT 07/30/2008  . OBESITY, MORBID 07/03/2007  . Other tenosynovitis of hand and wrist 06/11/2010  . OTITIS MEDIA, ACUTE, LEFT 11/10/2009  . Type II or unspecified type diabetes mellitus without mention of complication, uncontrolled 09/25/2012  . Wheezing 09/09/2009      History reviewed. No pertinent surgical history.    Social History:      Social History   Tobacco Use  . Smoking status: Never Smoker  . Smokeless tobacco: Never Used  Substance Use Topics  . Alcohol use: Yes    Alcohol/week: 0.0 standard drinks    Comment: rare       Family History :    No family history on file. + obeisity    Home Medications:   Prior to Admission medications   Medication Sig Start Date End Date Taking? Authorizing Provider  aspirin 81 MG tablet Take 81 mg by mouth daily.  Yes [provider]  HYDROcodone-homatropine (HYCODAN) 5-1.5 MG/5ML syrup Take 5-10 mLs by mouth every 8 (eight) hours as needed for cough. 07/16/19  Yes Hoyt Koch, MD  lisinopril (ZESTRIL) 20 MG tablet TAKE 1 TABLET EVERY DAY Patient taking differently: Take 20 mg by mouth daily.  01/26/19  Yes Biagio Borg, MD  metFORMIN (GLUCOPHAGE) 500 MG tablet 2 tab by mouth in the AM, and 1 tab in the PM 04/20/19  Yes Biagio Borg, MD  rosuvastatin (CRESTOR) 20 MG tablet TAKE 1 TABLET EVERY DAY Patient taking differently: Take 20 mg by mouth daily.  01/22/19  Yes Biagio Borg, MD     Allergies:    No Known Allergies   Physical Exam:   Vitals  Blood pressure 133/81, pulse 97, temperature 98 F (36.7 C), temperature source Oral, resp. rate (!) 22, SpO2 95 %.  1.  General: axoxo3  2. Psychiatric: euthymic  3. Neurologic: nonfocal  4. HEENMT:  Anicteric, pupils 1.34mm symmetric, direct, consensual, near intact Neck: no jvd  5. Respiratory : CTAB  6. Cardiovascular : rrr s1, s2,    7. Gastrointestinal:  Abd: soft, nt, nd, +bs  8. Skin:  Ext: no c/c/e, no rash  9.Musculoskeletal:  Good ROM    Data Review:    CBC Recent Labs  Lab 07/19/19 1738  WBC 7.8  HGB 14.8  HCT 44.7  PLT 320  MCV 87.0  MCH 28.8  MCHC 33.1  RDW 12.8  LYMPHSABS 0.8  MONOABS 0.4  EOSABS 0.0  BASOSABS 0.0   ------------------------------------------------------------------------------------------------------------------  Results for orders placed or performed during the hospital encounter of 07/19/19 (from the past 48 hour(s))  Lactic acid, plasma     Status: None   Collection Time: 07/19/19  5:38 PM  Result Value Ref Range   Lactic Acid, Venous 1.4 0.5 - 1.9 mmol/L    Comment: Performed at [facility], [place] [address], [place], [place] [address]  Lactic acid, plasma     Status: None   Collection Time: 07/19/19  5:38 PM  Result Value Ref Range   Lactic Acid, Venous 1.4 0.5 - 1.9 mmol/L    Comment: Performed at [facility], [place] [address], [place], [place] [address]  CBC WITH DIFFERENTIAL     Status: None   Collection Time: 07/19/19  5:38 PM  Result Value Ref Range   WBC 7.8 4.0 - 10.5 K/uL   RBC 5.14 4.22 - 5.81 MIL/uL   Hemoglobin 14.8 13.0 - 17.0 g/dL   HCT 44.7 39.0 - 52.0 %   MCV 87.0 80.0 - 100.0 fL   MCH 28.8 26.0 - 34.0 pg   MCHC 33.1 30.0 - 36.0 g/dL   RDW 12.8 11.5 - 15.5 %   Platelets 320 150 - 400 K/uL   nRBC 0.0 0.0 - 0.2 %   Neutrophils Relative % 85 %   Neutro Abs 6.6 1.7 - 7.7 K/uL   Lymphocytes Relative 10 %   Lymphs Abs 0.8 0.7 - 4.0 K/uL   Monocytes Relative 5 %   Monocytes Absolute 0.4 0.1 - 1.0 K/uL   Eosinophils Relative 0 %   Eosinophils Absolute 0.0 0.0 - 0.5 K/uL   Basophils Relative 0 %   Basophils Absolute 0.0 0.0 - 0.1 K/uL   Immature Granulocytes 0 %   Abs Immature Granulocytes 0.03 0.00 - 0.07 K/uL    Comment: Performed at [facility], [address] [place], [place]  [address]  Comprehensive metabolic panel     Status: Abnormal   Collection Time: 07/19/19  5:38 PM  Result Value Ref Range   Sodium 137 135 - 145 mmol/L   Potassium 3.9 3.5 - 5.1 mmol/L   Chloride 100 98 - 111 mmol/L   CO2 22 22 - 32 mmol/L   Glucose, Bld 132 (H) 70 - 99 mg/dL   BUN 15 6 - 20 mg/dL   Creatinine, Ser 6.04 0.61 - 1.24 mg/dL   Calcium 8.5 (L) 8.9 - 10.3 mg/dL   Total Protein 7.8 6.5 - 8.1 g/dL   Albumin 3.2 (L) 3.5 - 5.0 g/dL   AST 28 15 - 41 U/L   ALT 28 0 - 44 U/L   Alkaline Phosphatase 47 38 - 126 U/L   Total Bilirubin 1.0 0.3 - 1.2 mg/dL   GFR calc non Af Amer >60 >60 mL/min   GFR calc Af Amer >60 >60 mL/min   Anion gap 15 5 - 15    Comment: Performed at [facility], [address], [place], [place] [address]  Procalcitonin     Status: None   Collection Time: 07/19/19  5:38 PM  Result Value Ref Range   Procalcitonin <0.10 ng/mL    Comment:        Interpretation: PCT (Procalcitonin) <= 0.5 ng/mL: Systemic infection (sepsis) is not likely. Local bacterial infection is possible. (NOTE)       Sepsis PCT Algorithm           Lower Respiratory Tract                                      Infection PCT Algorithm    ----------------------------     ----------------------------         PCT < 0.25 ng/mL                PCT < 0.10 ng/mL         Strongly encourage             Strongly discourage   discontinuation of antibiotics    initiation of antibiotics    ----------------------------     -----------------------------       PCT 0.25 - 0.50 ng/mL            PCT 0.10 - 0.25 ng/mL               OR       >80% decrease in PCT            Discourage initiation of                                            antibiotics      Encourage discontinuation           of antibiotics    ----------------------------     -----------------------------         PCT >= 0.50 ng/mL              PCT 0.26 - 0.50 ng/mL               AND        <80% decrease in PCT              Encourage initiation of  antibiotics       Encourage continuation           of antibiotics    ----------------------------     -----------------------------        PCT >= 0.50 ng/mL                  PCT > 0.50 ng/mL               AND         increase in PCT                  Strongly encourage                                      initiation of antibiotics    Strongly encourage escalation           of antibiotics                                     -----------------------------                                           PCT <= 0.25 ng/mL                                                 OR                                        > 80% decrease in PCT                                     Discontinue / Do not initiate                                             antibiotics Performed at [facility], [address] [place], [place] [address]   Lactate dehydrogenase     Status: Abnormal   Collection Time: 07/19/19  5:38 PM  Result Value Ref Range   LDH 231 (H) 98 - 192 U/L    Comment: Performed at [facility], [address] [place], [place] [address]  Ferritin     Status: Abnormal   Collection Time: 07/19/19  5:38 PM  Result Value Ref Range   Ferritin 1,289 (H) 24 - 336 ng/mL    Comment: Performed at [facility], [address] [place], [place] [address]  Triglycerides     Status: Abnormal   Collection Time: 07/19/19  5:38 PM  Result Value Ref Range   Triglycerides 218 (H) <150 mg/dL    Comment: Performed at [facility], [address] [place], [place] [address]  Fibrinogen     Status: Abnormal   Collection Time: 07/19/19  5:38 PM  Result Value  Ref Range   Fibrinogen >800 (H) 210 - 475 mg/dL    Comment: Performed at [name] [address], [place], [place] [address]  C-reactive protein     Status: Abnormal   Collection Time: 07/19/19   5:38 PM  Result Value Ref Range   CRP 11.7 (H) <1.0 mg/dL    Comment: Performed at [facility], [address], [place], [place] [address]  D-dimer, quantitative (not at [facility])     Status: Abnormal   Collection Time: 07/19/19  5:45 PM  Result Value Ref Range   D-Dimer, Quant 1.29 (H) 0.00 - 0.50 ug/mL-FEU    Comment: (NOTE) At the manufacturer cut-off of 0.50 ug/mL FEU, this assay has been documented to exclude PE with a sensitivity and negative predictive value of 97 to 99%.  At this time, this assay has not been approved by the FDA to exclude DVT/VTE. Results should be correlated with clinical presentation. Performed at [facility], [address] [place], [place] [address]     Chemistries  Recent Labs  Lab 07/19/19 1738  NA 137  K 3.9  CL 100  CO2 22  GLUCOSE 132*  BUN 15  CREATININE 0.94  CALCIUM 8.5*  AST 28  ALT 28  ALKPHOS 47  BILITOT 1.0   ------------------------------------------------------------------------------------------------------------------  ------------------------------------------------------------------------------------------------------------------ GFR: CrCl cannot be calculated (Unknown ideal weight.). Liver Function Tests: Recent Labs  Lab 07/19/19 1738  AST 28  ALT 28  ALKPHOS 47  BILITOT 1.0  PROT 7.8  ALBUMIN 3.2*   No results for input(s): LIPASE, AMYLASE in the last 168 hours. No results for input(s): AMMONIA in the last 168 hours. Coagulation Profile: No results for input(s): INR, PROTIME in the last 168 hours. Cardiac Enzymes: No results for input(s): CKTOTAL, CKMB, CKMBINDEX, TROPONINI in the last 168 hours. BNP (last 3 results) No results for input(s): PROBNP in the last 8760 hours. HbA1C: No results for input(s): HGBA1C in the last 72 hours. CBG: No results for input(s): GLUCAP in the last 168 hours. Lipid Profile: Recent Labs    07/19/19 1738  TRIG 218*   Thyroid  Function Tests: No results for input(s): TSH, T4TOTAL, FREET4, T3FREE, THYROIDAB in the last 72 hours. Anemia Panel: Recent Labs    07/19/19 1738  FERRITIN 1,289*    --------------------------------------------------------------------------------------------------------------- Urine analysis:    Component Value Date/Time   COLORURINE YELLOW 11/02/2018 1627   APPEARANCEUR CLEAR 11/02/2018 1627   LABSPEC >=1.030 (A) 11/02/2018 1627   PHURINE 6.0 11/02/2018 1627   GLUCOSEU 250 (A) 11/02/2018 1627   HGBUR TRACE-INTACT (A) 11/02/2018 1627   BILIRUBINUR NEGATIVE 11/02/2018 1627   KETONESUR NEGATIVE 11/02/2018 1627   UROBILINOGEN 0.2 11/02/2018 1627   NITRITE NEGATIVE 11/02/2018 1627   LEUKOCYTESUR NEGATIVE 11/02/2018 1627      Imaging Results:    Dg Chest Port 1 View  Result Date: 07/19/2019 CLINICAL DATA:  Shortness of breath, Covera positive 07/05/2019, hypoxia EXAM: PORTABLE CHEST 1 VIEW COMPARISON:  01/22/2015 FINDINGS: Lower lung volumes with increased mid and lower lung mixed interstitial and airspace opacities concerning for pneumonia. Stable heart size and vascularity. No effusion or pneumothorax. Trachea midline. Degenerative changes of the spine. IMPRESSION: Increased mid and lower lung mixed interstitial and peripheral airspace opacities concerning for developing pneumonia. Electronically Signed   By: Judie Petit.  Shick M.D.   On: 07/19/2019 18:14    ST at 100, nl axis, nl int, no st-t changes c/w ischemia   Assessment & Plan:  Principal Problem:   COVID-19 virus infection Active Problems:   Essential hypertension   Diabetes (HCC)  Covid -19 infection Check IL-6   No NSAIDS Dexamethasone  iv qday Remdesivir consult  CAP Blood culture x2 Urine strep antigen, urine Legionella antigen Rocephin 1gm iv qday Zithromax  iv qday  Tachycardia Tele D dimer, if positive then CTA chest please  Hypertension Cont Lisinopril  po qday  Hyperlipidemia Cont  Crestor  po qhs  Dm2 Cont Metformin  po qam, and  po qhs fsbs ac and qhs, ISS    DVT Prophylaxis-   Lovenox - SCDs   AM Labs Ordered, also please review Full Orders  Family Communication: Admission, patients condition and plan of care including tests being ordered have been discussed with the patient  who indicate understanding and agree with the plan and Code Status.  Code Status:  FULL CODE per patient, left message for wife that he has been admitted to [facility]  Admission status: ipatient: Based on patients clinical presentation and evaluation of above clinical data, I have made determination that patient meets Inpatient criteria at this time.    Time spent in minutes : 70 minutes   Pearson Grippe M.D on 07/19/2019 at 7:33 PM

## 2019-07-19 NOTE — ED Notes (Signed)
Attempted to call report to [facility] hospital room 9101 spoke with [place] who stated the receiving RN was not available at this time. She was informed that [facility] was enroute to pick up pt and call back number was given.

## 2019-07-19 NOTE — ED Triage Notes (Signed)
Patient here from home with complaints of SOB. Diagnosed with COVID 10/1. 84%RA. Weakness, fatigue.

## 2019-07-19 NOTE — ED Provider Notes (Signed)
[facility]-EMERGENCY DEPT Provider Note   CSN: [id number] Arrival date & time: 07/19/19  1620   History   Chief Complaint COVID positive, Hypoxic  HPI Martin Lozano is a 58 y.o. male with past medical history significant for hypertension, hyperlipidemia, obesity who presents for evaluation of shortness of breath.  Diagnosed with COVID approximately 1 week ago.  Symptoms actually started the Sunday before that.  He is approximately on day 10/11.  Patient states he is on telemetry visits with his PCP.  Was given Hycodan for his cough.  Patient states he has had increasing shortness of breath over the last 2 days.  Patient states he feels winded with small exertional activities.  He denies any chest pain.  He admits to nonproductive cough, congestion, rhinorrhea.  He has had some mild nausea without emesis.  Has had intermittent headaches her denies sudden onset thunderclap headache, unilateral weakness, slurred speech, vision changes.  Has been taking Tylenol for his fever.  Patient states today was the first day he did not have a fever.  He denies any abdominal pain, dysuria.  Has had intermittent diarrhea since his diagnosis.  Denies melena, hematochezia.  He is tolerating p.o. intake however admits to decreased appetite.  Denies additional aggravating or alleviating factors.  No recent surgery, recent mobilization unilateral leg swelling, redness, warmth, exogenous hormone use, malignancy.  No history of PE or DVT.  History obtained from patient and past medical records. No interpreter is used.  EMS patient hypoxic on initial evaluation at 84%.  He was placed on 3 L nasal cannula with oxygen saturations returning to 98%.       HPI  Past Medical History:  Diagnosis Date  . ALLERGIC RHINITIS 09/06/2007  . ASTHMATIC BRONCHITIS, ACUTE 11/16/2007  . HEMORRHOIDS 07/03/2007  . HYPERLIPIDEMIA 09/06/2007  . HYPERTENSION 07/03/2007  . KNEE SPRAIN, LEFT 07/30/2008  . OBESITY,  MORBID 07/03/2007  . Other tenosynovitis of hand and wrist 06/11/2010  . OTITIS MEDIA, ACUTE, LEFT 11/10/2009  . Type II or unspecified type diabetes mellitus without mention of complication, uncontrolled 09/25/2012  . Wheezing 09/09/2009    Patient Active Problem List   Diagnosis Date Noted  . COVID-19 virus infection 07/10/2019  . Umbilical hernia 11/02/2018  . Palpitations 06/25/2016  . DOE (dyspnea on exertion) 01/21/2015  . Diabetes (HCC) 09/25/2012  . Right knee pain 07/02/2012  . Preventative health care 09/15/2011  . Hyperlipidemia 09/06/2007  . Allergic rhinitis 09/06/2007  . OBESITY, MORBID 07/03/2007  . Essential hypertension 07/03/2007  . HEMORRHOIDS 07/03/2007    History reviewed. No pertinent surgical history.      Home Medications    Prior to Admission medications   Medication Sig Start Date End Date Taking? Authorizing Provider  aspirin 81 MG tablet Take 81 mg by mouth daily.     Yes [provider]  HYDROcodone-homatropine (HYCODAN) 5-1.5 MG/5ML syrup Take 5-10 mLs by mouth every 8 (eight) hours as needed for cough. 07/16/19  Yes Myrlene Broker, MD  lisinopril (ZESTRIL) 20 MG tablet TAKE 1 TABLET EVERY DAY Patient taking differently: Take 20 mg by mouth daily.  01/26/19  Yes [name], MD  metFORMIN (GLUCOPHAGE) 500 MG tablet 2 tab by mouth in the AM, and 1 tab in the PM 04/20/19  Yes [name], MD  rosuvastatin (CRESTOR) 20 MG tablet TAKE 1 TABLET EVERY DAY Patient taking differently: Take 20 mg by mouth daily.  01/22/19  Yes [name], MD  Family History No family history on file.  Social History Social History   Tobacco Use  . Smoking status: Never Smoker  . Smokeless tobacco: Never Used  Substance Use Topics  . Alcohol use: Yes    Alcohol/week: 0.0 standard drinks    Comment: rare  . Drug use: No     Allergies   Patient has no known allergies.   Review of Systems Review of Systems  Constitutional: Positive for  appetite change, chills and fever.  HENT: Negative.   Eyes: Negative.   Respiratory: Positive for cough and shortness of breath. Negative for apnea, choking, chest tightness, wheezing and stridor.   Cardiovascular: Negative.   Gastrointestinal: Negative.   Genitourinary: Negative.   Musculoskeletal: Negative.   Skin: Negative.   Neurological: Positive for headaches (INtermittent ). Negative for dizziness, tremors, seizures, syncope, facial asymmetry, speech difficulty, weakness, light-headedness and numbness.  All other systems reviewed and are negative.   Physical Exam Updated Vital Signs BP 137/84 (BP Location: Left Arm)   Pulse (!) 102   Temp 98 F (36.7 C) (Oral)   Resp 20   SpO2 100%   Physical Exam Vitals signs and nursing note reviewed.  Constitutional:      General: He is not in acute distress.    Appearance: He is not ill-appearing, toxic-appearing or diaphoretic.  HENT:     Head: Normocephalic and atraumatic.     Jaw: There is normal jaw occlusion.     Right Ear: Tympanic membrane, ear canal and external ear normal. There is no impacted cerumen. No hemotympanum. Tympanic membrane is not injected, scarred, perforated, erythematous, retracted or bulging.     Left Ear: Tympanic membrane, ear canal and external ear normal. There is no impacted cerumen. No hemotympanum. Tympanic membrane is not injected, scarred, perforated, erythematous, retracted or bulging.     Ears:     Comments: No Mastoid tenderness.    Nose:     Comments: Clear rhinorrhea and congestion to bilateral nares.  No sinus tenderness.    Mouth/Throat:     Comments: Posterior oropharynx clear.  Mucous membranes moist.  Tonsils without erythema or exudate.  Uvula midline without deviation.  No evidence of PTA or RPA.  No drooling, dysphasia or trismus.  Phonation normal. Neck:     Trachea: Trachea and phonation normal.     Meningeal: Brudzinski's sign and Kernig's sign absent.     Comments: No Neck  stiffness or neck rigidity.  No meningismus.  No cervical lymphadenopathy. Cardiovascular:     Comments: No murmurs rubs or gallops. Pulmonary:     Comments: Clear to auscultation bilaterally without wheeze, rhonchi or rales.  No accessory muscle usage.  Able speak in full sentences. Abdominal:     Comments: Soft, nontender without rebound or guarding.  No CVA tenderness.  Musculoskeletal:     Comments: Moves all 4 extremities without difficulty.  Lower extremities without edema, erythema or warmth.  Skin:    Comments: Brisk capillary refill.  No rashes or lesions.  Neurological:     Mental Status: He is alert.     Comments: Ambulatory in department without difficulty.  Cranial nerves II through XII grossly intact.  No facial droop.  No aphasia.    ED Treatments / Results  Labs (all labs ordered are listed, but only abnormal results are displayed) Labs Reviewed  COMPREHENSIVE METABOLIC PANEL - Abnormal; Notable for the following components:      Result Value   Glucose, Bld 132 (*)  Calcium 8.5 (*)    Albumin 3.2 (*)    All other components within normal limits  LACTATE DEHYDROGENASE - Abnormal; Notable for the following components:   LDH 231 (*)    All other components within normal limits  FERRITIN - Abnormal; Notable for the following components:   Ferritin 1,289 (*)    All other components within normal limits  TRIGLYCERIDES - Abnormal; Notable for the following components:   Triglycerides 218 (*)    All other components within normal limits  FIBRINOGEN - Abnormal; Notable for the following components:   Fibrinogen >800 (*)    All other components within normal limits  C-REACTIVE PROTEIN - Abnormal; Notable for the following components:   CRP 11.7 (*)    All other components within normal limits  CULTURE, BLOOD (ROUTINE X 2)  CULTURE, BLOOD (ROUTINE X 2)  LACTIC ACID, PLASMA  LACTIC ACID, PLASMA  CBC WITH DIFFERENTIAL/PLATELET  PROCALCITONIN    EKG EKG  Interpretation  Date/Time:  Thursday July 19 2019 17:27:15 EDT Ventricular Rate:  104 PR Interval:    QRS Duration: 103 QT Interval:  353 QTC Calculation: 465 R Axis:   2 Text Interpretation:  Sinus tachycardia Borderline T wave abnormalities Confirmed by Milton Ferguson [id number]) on 07/19/2019 5:57:47 PM   Radiology Dg Chest Port 1 View  Result Date: 07/19/2019 CLINICAL DATA:  Shortness of breath, Covera positive 07/05/2019, hypoxia EXAM: PORTABLE CHEST 1 VIEW COMPARISON:  01/22/2015 FINDINGS: Lower lung volumes with increased mid and lower lung mixed interstitial and airspace opacities concerning for pneumonia. Stable heart size and vascularity. No effusion or pneumothorax. Trachea midline. Degenerative changes of the spine. IMPRESSION: Increased mid and lower lung mixed interstitial and peripheral airspace opacities concerning for developing pneumonia. Electronically Signed   By: Jerilynn Mages.  Shick M.D.   On: 07/19/2019 18:14    Procedures Procedures (including critical care time)  Medications Ordered in ED Medications - No data to display  Initial Impression / Assessment and Plan / ED Course  I have reviewed the triage vital signs and the nursing notes.  Pertinent labs & imaging results that were available during my care of the patient were reviewed by me and considered in my medical decision making (see chart for details).  58 year old male appears ill but not septic presents for evaluation of shortness of breath and known setting of COVID diagnosis.  Hypoxic with EMS to 84%.  Heart and lungs clear.  No evidence of DVT on exam.  Abdomen soft, nontender without rebound or guarding.  Oxygen 98% on 3 L nasal cannula.  He has had intermittent headaches however denies sudden onset thunderclap headache.  He is a nonfocal neurologic exam.  I have low suspicion for CVA, PE, SAH, trigeminal neuralgia, acute angle glaucoma, meningitis. No CP, hemoptysis, low suspicion for ACS, PE, dissection as cause  of SOB.  Reviewed chart.  COVID test positive on 07/10/19.  Patient will need admission for hypoxia in setting of COVID diagnosis.  CBC without leukocytosis Lactic acid 1.4 Chest x-ray with bilateral airspace opacities concerning for developing pneumonia.  Likely viral given known COVID diagnosis.  Revaluation patient comfortable on 3L [place]. No respiratory distress. No evidence of sepsis or sirs.  1900: Consulted with Dr. Maudie Mercury with [facility] who will evaluate patient for admission. VS stable. No acute respiratory distress.      Final Clinical Impressions(s) / ED Diagnoses   Final diagnoses:  [id number]  Hypoxia    ED Discharge Orders    None  Linwood DibblesHenderly,  A, PA-C 07/19/19 1910    Bethann BerkshireZammit, Joseph, MD 07/21/19 1040

## 2019-07-20 ENCOUNTER — Other Ambulatory Visit: Payer: Self-pay

## 2019-07-20 DIAGNOSIS — R0902 Hypoxemia: Secondary | ICD-10-CM

## 2019-07-20 LAB — COMPREHENSIVE METABOLIC PANEL
ALT: 26 U/L (ref 0–44)
AST: 25 U/L (ref 15–41)
Albumin: 3.1 g/dL — ABNORMAL LOW (ref 3.5–5.0)
Alkaline Phosphatase: 47 U/L (ref 38–126)
Anion gap: 13 (ref 5–15)
BUN: 19 mg/dL (ref 6–20)
CO2: 23 mmol/L (ref 22–32)
Calcium: 8.7 mg/dL — ABNORMAL LOW (ref 8.9–10.3)
Chloride: 102 mmol/L (ref 98–111)
Creatinine, Ser: 0.88 mg/dL (ref 0.61–1.24)
GFR calc Af Amer: >60 mL/min (ref >60)
GFR calc non Af Amer: >60 mL/min (ref >60)
Glucose, Bld: 161 mg/dL — ABNORMAL HIGH (ref 70–99)
Potassium: 4.3 mmol/L (ref 3.5–5.1)
Sodium: 138 mmol/L (ref 135–145)
Total Bilirubin: 0.9 mg/dL (ref 0.3–1.2)
Total Protein: 7.5 g/dL (ref 6.5–8.1)

## 2019-07-20 LAB — CBC
HCT: 42.4 % (ref 39.0–52.0)
Hemoglobin: 14.1 g/dL (ref 13.0–17.0)
MCH: 28.7 pg (ref 26.0–34.0)
MCHC: 33.3 g/dL (ref 30.0–36.0)
MCV: 86.4 fL (ref 80.0–100.0)
Platelets: 320 10*3/uL (ref 150–400)
RBC: 4.91 MIL/uL (ref 4.22–5.81)
RDW: 12.9 % (ref 11.5–15.5)
WBC: 5.8 10*3/uL (ref 4.0–10.5)
nRBC: 0 % (ref 0.0–0.2)

## 2019-07-20 LAB — GLUCOSE, CAPILLARY
Glucose-Capillary: 139 mg/dL — ABNORMAL HIGH (ref 70–99)
Glucose-Capillary: 133 mg/dL — ABNORMAL HIGH (ref 70–99)
Glucose-Capillary: 140 mg/dL — ABNORMAL HIGH (ref 70–99)
Glucose-Capillary: 202 mg/dL — ABNORMAL HIGH (ref 70–99)

## 2019-07-20 LAB — SAMPLE TO BLOOD BANK

## 2019-07-20 LAB — TYPE AND SCREEN
ABO/RH(D): O POS
Antibody Screen: NEGATIVE

## 2019-07-20 LAB — D-DIMER, QUANTITATIVE: D-Dimer, Quant: 1.3 ug/mL-FEU — ABNORMAL HIGH (ref 0.00–0.50)

## 2019-07-20 LAB — CBG MONITORING, ED: Glucose-Capillary: 120 mg/dL — ABNORMAL HIGH (ref 70–99)

## 2019-07-20 LAB — C-REACTIVE PROTEIN: CRP: 9.2 mg/dL — ABNORMAL HIGH (ref <1.0)

## 2019-07-20 MED ORDER — FAMOTIDINE IN NACL 20-0.9 MG/50ML-% IV SOLN
20.0000 mg | Freq: Every day | INTRAVENOUS | Status: DC
  Administered 2019-07-20: 15:00:00 20 mg via INTRAVENOUS
  Filled 2019-07-20 (×2): qty 50

## 2019-07-20 MED ORDER — METHYLPREDNISOLONE SODIUM SUCC 125 MG IJ SOLR
125.0000 mg | Freq: Once | INTRAMUSCULAR | Status: AC
  Administered 2019-07-20: 15:00:00 125 mg via INTRAVENOUS
  Filled 2019-07-20: qty 2

## 2019-07-20 MED ORDER — SODIUM CHLORIDE 0.9% IV SOLUTION: Freq: Once | INTRAVENOUS | Status: DC

## 2019-07-20 MED ORDER — ROSUVASTATIN CALCIUM 20 MG PO TABS
20.0000 mg | ORAL_TABLET | Freq: Every day | ORAL | Status: DC
  Administered 2019-07-20 – 2019-07-23 (×4): 20 mg via ORAL
  Filled 2019-07-20 (×4): qty 1

## 2019-07-20 MED ORDER — LISINOPRIL 20 MG PO TABS
20.0000 mg | ORAL_TABLET | Freq: Every day | ORAL | Status: DC
  Administered 2019-07-20 – 2019-07-23 (×4): 20 mg via ORAL
  Filled 2019-07-20 (×4): qty 1

## 2019-07-20 MED ORDER — HYDROCODONE-HOMATROPINE 5-1.5 MG/5ML PO SYRP: 5.0000 mL | ORAL_SOLUTION | Freq: Three times a day (TID) | ORAL | Status: DC | PRN

## 2019-07-20 MED ORDER — DIPHENHYDRAMINE HCL 25 MG PO CAPS
25.0000 mg | ORAL_CAPSULE | Freq: Four times a day (QID) | ORAL | Status: DC | PRN
  Administered 2019-07-20: 15:00:00 25 mg via ORAL
  Filled 2019-07-20: qty 1

## 2019-07-20 MED ORDER — ASPIRIN EC 81 MG PO TBEC
81.0000 mg | DELAYED_RELEASE_TABLET | Freq: Every day | ORAL | Status: DC
  Administered 2019-07-20 – 2019-07-23 (×4): 81 mg via ORAL
  Filled 2019-07-20 (×4): qty 1

## 2019-07-20 MED ORDER — INFLUENZA VAC SPLIT QUAD 0.5 ML IM SUSY: 0.5000 mL | PREFILLED_SYRINGE | INTRAMUSCULAR | Status: DC

## 2019-07-20 MED ORDER — METFORMIN HCL 500 MG PO TABS
1000.0000 mg | ORAL_TABLET | Freq: Every day | ORAL | Status: DC
  Administered 2019-07-20: 09:00:00 1000 mg via ORAL
  Filled 2019-07-20: qty 2

## 2019-07-20 MED ORDER — METFORMIN HCL 500 MG PO TABS: 500.0000 mg | ORAL_TABLET | Freq: Every day | ORAL | Status: DC

## 2019-07-20 MED ORDER — METFORMIN HCL 500 MG PO TABS: 500.0000 mg | ORAL_TABLET | Freq: Two times a day (BID) | ORAL | Status: DC

## 2019-07-20 NOTE — ED Notes (Signed)
[facility] at bedside, transferring to [facility]

## 2019-07-20 NOTE — Progress Notes (Signed)
Assumed care of pt at 0700. Pt A&Ox4. No c/o pain or discomfort. Pt intermittently on 3L [place], informed pt of importance of flutter valve and incentive spirometer.    Gave pt convalescent plasma per orders. Upon completion of plasma, large welts were noted on pt's chest, scalp and back. Pt stated that his scalp had begun to itch about 30 mins prior. Called Dr. Cruzita Lederer about reaction. Solumedrol, benadryl, and pepcid ordered and given per orders. VS remained stable throughout whole episode. Pt stated breathing was not labored and that his chest did not feel tight. No wheezes noted upon auscultation. Will continue to monitor pt.

## 2019-07-20 NOTE — Plan of Care (Signed)
Patient admitted this shift for increased SOB after COVID positive results. Remdesivir and Decadron started. Patient on 3L [place].

## 2019-07-20 NOTE — Progress Notes (Signed)
PROGRESS NOTE  Martin Lozano WUJ:[id number] DOB: [birth year] DOA: 07/19/2019 PCP: [name], MD   LOS: 1 day   Brief Narrative / Interim history: Pleasant 58 year old male with history of morbid obesity, hypertension, hyperlipidemia, type 2 diabetes mellitus, presents to the hospital and is admitted on 07/19/2019 with Covid symptoms for the past week and a half.  He has been having fatigue, fevers, since 07/06/2019.  He tested positive for COVID-19 on 07/10/2019.  His fever persisted up until a day or 2 prior to hospitalization, however in the last 3 days he has been having progressive shortness of breath and started to come to the ED.  He was hypoxic satting 84% on room air and a chest x-ray showed multifocal pneumonia.  Subjective / 24h Interval events: -He is doing well this morning, denies any fever or chills.  Still complains of shortness of breath with ambulation.  Denies any chest pain, denies any palpitations.  No abdominal pain, no nausea or vomiting.  Complains of several episodes of diarrhea, last one yesterday  Assessment & Plan: Principal Problem:   COVID-19 virus infection Active Problems:   Essential hypertension   Diabetes (HCC)   Principal Problem Acute Hypoxic Respiratory Failure due to Covid-19 Viral Illness -Closely monitoring the inpatient status, chest x-ray personally reviewed shows bilateral infiltrates.  Patient initially started on antibiotics however procalcitonin is negative, his white count is normal, and this is likely all to be viral pneumonia, discontinue antibiotics and closely monitor -Patient was started on Remdesivir for 5 days, along with Decadron -Transfuse convalescent plasma 10/16 -CRP improving now as below -High risk of decompensation given multiple comorbidities   COVID-19 Labs  Recent Labs    07/19/19 1738 07/19/19 1745 07/20/19 0810  DDIMER  --  1.29* 1.30*  FERRITIN 1,289*  --   --   LDH 231*  --   --   CRP 11.7*  --  9.2*     Lab Results  Component Value Date   SARSCOV2NAA Detected (A) 07/10/2019    The treatment plan and use of medications and known side effects were discussed with patient/family, they were clearly explained that there is no proven definitive treatment for COVID-19 infection, any medications used here are based on published clinical articles/anecdotal data which are not peer-reviewed or randomized control trials.  Complete risks and long-term side effects are unknown, however in the best clinical judgment they seem to be of some clinical benefit rather than medical risks.  Patient agree with the treatment plan and want to receive the given medications.  Active Problems Type 2 diabetes mellitus -Continue to monitor CBGs as below, for now stable.  Hold home metformin and continue sliding scale  Hypertension -Blood pressure stable, continue home lisinopril  Hyperlipidemia -Continue statin  Morbid obesity -Patient's BMI greater than 40, he would benefit from significant weight loss   CBG (last 3)  Recent Labs    07/20/19 0110 07/20/19 0818  GLUCAP 120* 139*    Scheduled Meds: . sodium chloride   Intravenous Once  . aspirin EC  81 mg Oral Daily  . dexamethasone (DECADRON) injection  6 mg Intravenous Q24H  . enoxaparin (LOVENOX) injection  60 mg Subcutaneous Q24H  . [START ON 07/25/2019] influenza vac split quadrivalent PF  0.5 mL Intramuscular Tomorrow-1000  . insulin aspart  0-5 Units Subcutaneous QHS  . insulin aspart  0-9 Units Subcutaneous TID WC  . lisinopril  20 mg Oral Daily  . rosuvastatin  20 mg Oral Daily  Continuous Infusions: . azithromycin Stopped (07/20/19 0255)  . cefTRIAXone (ROCEPHIN)  IV Stopped (07/20/19 0215)  . remdesivir 100 mg in NS 250 mL     PRN Meds:.acetaminophen, HYDROcodone-homatropine  DVT prophylaxis: Lovenox Code Status: Full code Family Communication: d/w patient  Disposition Plan: home when ready   Consultants:  None   Procedures:   None   Microbiology: None   Antimicrobials: Ceftriaxone / Azithromycin 10/15 >> 10/16   Objective: Vitals:   07/20/19 0135 07/20/19 0200 07/20/19 0345 07/20/19 0818  BP:  98/80 127/74 131/84  Pulse:  89 88 83  Resp:  20 (!) 21 (!) 25  Temp:  99.4 F (37.4 C) 99 F (37.2 C) 98.3 F (36.8 C)  TempSrc:  Oral Oral Oral  SpO2:  92% 96% 94%  Weight: 133.8 kg     Height: 5\' 11"  (1.803 m)       Intake/Output Summary (Last 24 hours) at 07/20/2019 1027 Last data filed at 07/20/2019 0154 Gross per 24 hour  Intake 600 ml  Output -  Net 600 ml   Filed Weights   07/20/19 0135  Weight: 133.8 kg    Examination:  Constitutional: NAD Eyes: PERRL, lids and conjunctivae normal ENMT: Mucous membranes are moist.  Respiratory: Bibasilar rhonchi, no wheezing, no crackles.  Increased respiratory effort, slightly tachypneic Cardiovascular: Regular rate and rhythm, no murmurs / rubs / gallops. No LE edema.   Abdomen: no tenderness. Bowel sounds positive.  Musculoskeletal: no clubbing / cyanosis.  Skin: no rashes Neurologic: CN 2-12 grossly intact. Strength 5/5 in all 4.  Psychiatric: Normal judgment and insight. Alert and oriented x 3. Normal mood.    Data Reviewed: I have independently reviewed following labs and imaging studies   CBC: Recent Labs  Lab 07/19/19 1738 07/20/19 0810  WBC 7.8 5.8  NEUTROABS 6.6  --   HGB 14.8 14.1  HCT 44.7 42.4  MCV 87.0 86.4  PLT 320 500   Basic Metabolic Panel: Recent Labs  Lab 07/19/19 1738 07/20/19 0810  NA 137 138  K 3.9 4.3  CL 100 102  CO2 22 23  GLUCOSE 132* 161*  BUN 15 19  CREATININE 0.94 0.88  CALCIUM 8.5* 8.7*   GFR: Estimated Creatinine Clearance: 130.9 mL/min (by C-G formula based on SCr of 0.88 mg/dL). Liver Function Tests: Recent Labs  Lab 07/19/19 1738 07/20/19 0810  AST 28 25  ALT 28 26  ALKPHOS 47 47  BILITOT 1.0 0.9  PROT 7.8 7.5  ALBUMIN 3.2* 3.1*   No results for input(s): LIPASE, AMYLASE in the  last 168 hours. No results for input(s): AMMONIA in the last 168 hours. Coagulation Profile: No results for input(s): INR, PROTIME in the last 168 hours. Cardiac Enzymes: No results for input(s): CKTOTAL, CKMB, CKMBINDEX, TROPONINI in the last 168 hours. BNP (last 3 results) No results for input(s): PROBNP in the last 8760 hours. HbA1C: No results for input(s): HGBA1C in the last 72 hours. CBG: Recent Labs  Lab 07/20/19 0110 07/20/19 0818  GLUCAP 120* 139*   Lipid Profile: Recent Labs    07/19/19 1738  TRIG 218*   Thyroid Function Tests: No results for input(s): TSH, T4TOTAL, FREET4, T3FREE, THYROIDAB in the last 72 hours. Anemia Panel: Recent Labs    07/19/19 1738  FERRITIN 1,289*   Urine analysis:    Component Value Date/Time   COLORURINE YELLOW 11/02/2018 1627   APPEARANCEUR CLEAR 11/02/2018 1627   LABSPEC >=1.030 (A) 11/02/2018 1627   PHURINE 6.0 11/02/2018 1627  GLUCOSEU 250 (A) 11/02/2018 1627   HGBUR TRACE-INTACT (A) 11/02/2018 1627   BILIRUBINUR NEGATIVE 11/02/2018 1627   KETONESUR NEGATIVE 11/02/2018 1627   UROBILINOGEN 0.2 11/02/2018 1627   NITRITE NEGATIVE 11/02/2018 1627   LEUKOCYTESUR NEGATIVE 11/02/2018 1627   Sepsis Labs: Invalid input(s): PROCALCITONIN, LACTICIDVEN  Recent Results (from the past 240 hour(s))  Blood Culture (routine x 2)     Status: None (Preliminary result)   Collection Time: 07/19/19  6:00 PM   Specimen: BLOOD  Result Value Ref Range Status   Specimen Description   Final    BLOOD RIGHT WRIST Performed at [facility], [address] [place], [place] [address]    Special Requests   Final    BOTTLES DRAWN AEROBIC AND ANAEROBIC Blood Culture adequate volume Performed at [facility], [address] [place], [place] [address]    Culture   Final    NO GROWTH < 24 HOURS Performed at [facility], [address] [place], [place] [address]    Report Status PENDING  Incomplete   Blood Culture (routine x 2)     Status: None (Preliminary result)   Collection Time: 07/19/19  6:10 PM   Specimen: BLOOD  Result Value Ref Range Status   Specimen Description   Final    BLOOD RIGHT HAND Performed at [facility], [address] [place], [place] [address]    Special Requests   Final    BOTTLES DRAWN AEROBIC AND ANAEROBIC Blood Culture adequate volume Performed at [facility], [address] [place], [place] [address]    Culture   Final    NO GROWTH < 24 HOURS Performed at [facility], [address] [place], [place] [address]    Report Status PENDING  Incomplete      Radiology Studies: Dg Chest Port 1 View  Result Date: 07/19/2019 CLINICAL DATA:  Shortness of breath, Covera positive 07/05/2019, hypoxia EXAM: PORTABLE CHEST 1 VIEW COMPARISON:  01/22/2015 FINDINGS: Lower lung volumes with increased mid and lower lung mixed interstitial and airspace opacities concerning for pneumonia. Stable heart size and vascularity. No effusion or pneumothorax. Trachea midline. Degenerative changes of the spine. IMPRESSION: Increased mid and lower lung mixed interstitial and peripheral airspace opacities concerning for developing pneumonia. Electronically Signed   By: Judie PetitM.  Shick M.D.   On: 07/19/2019 18:14    Pamella Pertostin Saige [place], MD, PhD [facility]  Contact via  [email or web address]  [facility] Office Info P: [phone] F: [phone]

## 2019-07-21 LAB — COMPREHENSIVE METABOLIC PANEL
ALT: 29 U/L (ref 0–44)
AST: 28 U/L (ref 15–41)
Albumin: 3.1 g/dL — ABNORMAL LOW (ref 3.5–5.0)
Alkaline Phosphatase: 48 U/L (ref 38–126)
Anion gap: 12 (ref 5–15)
BUN: 22 mg/dL — ABNORMAL HIGH (ref 6–20)
CO2: 25 mmol/L (ref 22–32)
Calcium: 8.9 mg/dL (ref 8.9–10.3)
Chloride: 103 mmol/L (ref 98–111)
Creatinine, Ser: 0.84 mg/dL (ref 0.61–1.24)
GFR calc Af Amer: >60 mL/min (ref >60)
GFR calc non Af Amer: >60 mL/min (ref >60)
Glucose, Bld: 191 mg/dL — ABNORMAL HIGH (ref 70–99)
Potassium: 4.3 mmol/L (ref 3.5–5.1)
Sodium: 140 mmol/L (ref 135–145)
Total Bilirubin: 0.7 mg/dL (ref 0.3–1.2)
Total Protein: 7.4 g/dL (ref 6.5–8.1)

## 2019-07-21 LAB — C-REACTIVE PROTEIN: CRP: 6.5 mg/dL — ABNORMAL HIGH (ref <1.0)

## 2019-07-21 LAB — CBC
HCT: 41.6 % (ref 39.0–52.0)
Hemoglobin: 13.6 g/dL (ref 13.0–17.0)
MCH: 28.6 pg (ref 26.0–34.0)
MCHC: 32.7 g/dL (ref 30.0–36.0)
MCV: 87.4 fL (ref 80.0–100.0)
Platelets: 346 10*3/uL (ref 150–400)
RBC: 4.76 MIL/uL (ref 4.22–5.81)
RDW: 12.8 % (ref 11.5–15.5)
WBC: 5 10*3/uL (ref 4.0–10.5)
nRBC: 0 % (ref 0.0–0.2)

## 2019-07-21 LAB — BPAM FFP
Blood Product Expiration Date: 202010171217
ISSUE DATE / TIME: 202010161225
Unit Type and Rh: 9500

## 2019-07-21 LAB — D-DIMER, QUANTITATIVE: D-Dimer, Quant: 1.08 ug/mL-FEU — ABNORMAL HIGH (ref 0.00–0.50)

## 2019-07-21 LAB — GLUCOSE, CAPILLARY
Glucose-Capillary: 174 mg/dL — ABNORMAL HIGH (ref 70–99)
Glucose-Capillary: 197 mg/dL — ABNORMAL HIGH (ref 70–99)
Glucose-Capillary: 177 mg/dL — ABNORMAL HIGH (ref 70–99)
Glucose-Capillary: 189 mg/dL — ABNORMAL HIGH (ref 70–99)

## 2019-07-21 LAB — PREPARE FRESH FROZEN PLASMA: Unit division: 0

## 2019-07-21 NOTE — Progress Notes (Signed)
PROGRESS NOTE  [place] RossettiRonald B Lozano ZOX:[id number] DOB: [birth year] DOA: 07/19/2019 PCP: Corwin LevinsJohn, [name], MD   LOS: 2 days   Brief Narrative / Interim history: Pleasant 58 year old male with history of morbid obesity, hypertension, hyperlipidemia, type 2 diabetes mellitus, presents to the hospital and is admitted on 07/19/2019 with Covid symptoms for the past week and a half.  He has been having fatigue, fevers, since 07/06/2019.  He tested positive for COVID-19 on 07/10/2019.  His fever persisted up until a day or 2 prior to hospitalization, however in the last 3 days he has been having progressive shortness of breath and started to come to the ED.  He was hypoxic satting 84% on room air and a chest x-ray showed multifocal pneumonia.  Subjective / 24h Interval events: -Feels improved, denies any fever or chills.  Mild shortness of breath upon walking to the bathroom but overall feels a lot better  Assessment & Plan: Principal Problem:   COVID-19 virus infection Active Problems:   Essential hypertension   Diabetes (HCC)   Principal Problem Acute Hypoxic Respiratory Failure due to Covid-19 Viral Illness -Closely monitoring the inpatient status, chest x-ray personally reviewed shows bilateral infiltrates.  Patient initially started on antibiotics however procalcitonin is negative, his white count is normal, and this is likely all to be viral pneumonia, discontinue antibiotics and closely monitor -Patient was started on Remdesivir for 5 days, along with Decadron -Transfused convalescent plasma 10/16, developed mild allergic reaction with hives, pruritic, resolved with steroids, Benadryl as well as famotidine. -Inflammatory markers continue to improve   COVID-19 Labs  Recent Labs    07/19/19 1738 07/19/19 1745 07/20/19 0810 07/21/19 0036  DDIMER  --  1.29* 1.30* 1.08*  FERRITIN 1,289*  --   --   --   LDH 231*  --   --   --   CRP 11.7*  --  9.2* 6.5*    Lab Results  Component Value Date    SARSCOV2NAA Detected (A) 07/10/2019    The treatment plan and use of medications and known side effects were discussed with patient/family, they were clearly explained that there is no proven definitive treatment for COVID-19 infection, any medications used here are based on published clinical articles/anecdotal data which are not peer-reviewed or randomized control trials.  Complete risks and long-term side effects are unknown, however in the best clinical judgment they seem to be of some clinical benefit rather than medical risks.  Patient agree with the treatment plan and want to receive the given medications.  Active Problems Type 2 diabetes mellitus -Hold home metformin and continue sliding scale -Stable as below  CBG (last 3)  Recent Labs    07/20/19 2100 07/21/19 0721 07/21/19 1150  GLUCAP 202* 174* 197*   Hypertension -Blood pressure overall acceptable, continue lisinopril  Hyperlipidemia -Continue statin  Morbid obesity -Patient's BMI greater than 40, he would benefit from significant weight loss   CBG (last 3)  Recent Labs    07/20/19 2100 07/21/19 0721 07/21/19 1150  GLUCAP 202* 174* 197*    Scheduled Meds: . sodium chloride   Intravenous Once  . aspirin EC  81 mg Oral Daily  . dexamethasone (DECADRON) injection  6 mg Intravenous Q24H  . enoxaparin (LOVENOX) injection  60 mg Subcutaneous Q24H  . [START ON 07/25/2019] influenza vac split quadrivalent PF  0.5 mL Intramuscular Tomorrow-1000  . insulin aspart  0-5 Units Subcutaneous QHS  . insulin aspart  0-9 Units Subcutaneous TID WC  . lisinopril  20 mg Oral Daily  . rosuvastatin  20 mg Oral Daily   Continuous Infusions: . remdesivir 100 mg in NS 250 mL 100 mg (07/20/19 1835)   PRN Meds:.acetaminophen, diphenhydrAMINE, HYDROcodone-homatropine  DVT prophylaxis: Lovenox Code Status: Full code Family Communication: d/Lozano patient  Disposition Plan: home when ready   Consultants:  None   Procedures:   None   Microbiology: None   Antimicrobials: Ceftriaxone / Azithromycin 10/15 >> 10/16   Objective: Vitals:   07/20/19 1630 07/20/19 2000 07/21/19 0552 07/21/19 0719  BP: 124/70 130/75 (!) 146/77 (!) 151/76  Pulse: 80 85 81 74  Resp: (!) 26 (!) 21 19 18   Temp:  98.2 F (36.8 C) 97.9 F (36.6 C) 98.1 F (36.7 C)  TempSrc:  Oral Oral Oral  SpO2: 95% 96% 92% 91%  Weight:      Height:        Intake/Output Summary (Last 24 hours) at 07/21/2019 1305 Last data filed at 07/21/2019 0240 Gross per 24 hour  Intake 1033.33 ml  Output 501 ml  Net 532.33 ml   Filed Weights   07/20/19 0135  Weight: 133.8 kg    Examination:  Constitutional: No distress Eyes: No scleral icterus ENMT: Moist mucous membranes Respiratory: Bibasilar rhonchi, no wheezing, no crackles Cardiovascular: Regular rate and rhythm, no murmurs.  No edema.   Abdomen: Soft, nontender, bowel sounds positive Musculoskeletal: no clubbing / cyanosis.  Skin: No rashes seen Neurologic: No focal deficits, equal strength Psychiatric: Normal judgment and insight. Alert and oriented x 3. Normal mood.    Data Reviewed: I have independently reviewed following labs and imaging studies   CBC: Recent Labs  Lab 07/19/19 1738 07/20/19 0810 07/21/19 0036  WBC 7.8 5.8 5.0  NEUTROABS 6.6  --   --   HGB 14.8 14.1 13.6  HCT 44.7 42.4 41.6  MCV 87.0 86.4 87.4  PLT 320 320 346   Basic Metabolic Panel: Recent Labs  Lab 07/19/19 1738 07/20/19 0810 07/21/19 0036  NA 137 138 140  K 3.9 4.3 4.3  CL 100 102 103  CO2 22 23 25   GLUCOSE 132* 161* 191*  BUN 15 19 22*  CREATININE 0.94 0.88 0.84  CALCIUM 8.5* 8.7* 8.9   GFR: Estimated Creatinine Clearance: 137.1 mL/min (by C-G formula based on SCr of 0.84 mg/dL). Liver Function Tests: Recent Labs  Lab 07/19/19 1738 07/20/19 0810 07/21/19 0036  AST 28 25 28   ALT 28 26 29   ALKPHOS 47 47 48  BILITOT 1.0 0.9 0.7  PROT 7.8 7.5 7.4  ALBUMIN 3.2* 3.1* 3.1*   No  results for input(s): LIPASE, AMYLASE in the last 168 hours. No results for input(s): AMMONIA in the last 168 hours. Coagulation Profile: No results for input(s): INR, PROTIME in the last 168 hours. Cardiac Enzymes: No results for input(s): CKTOTAL, CKMB, CKMBINDEX, TROPONINI in the last 168 hours. BNP (last 3 results) No results for input(s): PROBNP in the last 8760 hours. HbA1C: No results for input(s): HGBA1C in the last 72 hours. CBG: Recent Labs  Lab 07/20/19 1154 07/20/19 1612 07/20/19 2100 07/21/19 0721 07/21/19 1150  GLUCAP 133* 140* 202* 174* 197*   Lipid Profile: Recent Labs    07/19/19 1738  TRIG 218*   Thyroid Function Tests: No results for input(s): TSH, T4TOTAL, FREET4, T3FREE, THYROIDAB in the last 72 hours. Anemia Panel: Recent Labs    07/19/19 1738  FERRITIN 1,289*   Urine analysis:    Component Value Date/Time   COLORURINE YELLOW 11/02/2018  [facility] 11/02/2018 1627   LABSPEC >=1.030 (A) 11/02/2018 1627   PHURINE 6.0 11/02/2018 1627   GLUCOSEU 250 (A) 11/02/2018 1627   HGBUR TRACE-INTACT (A) 11/02/2018 [facility] 11/02/2018 1627   KETONESUR NEGATIVE 11/02/2018 1627   UROBILINOGEN 0.2 11/02/2018 1627   NITRITE NEGATIVE 11/02/2018 1627   LEUKOCYTESUR NEGATIVE 11/02/2018 1627   Sepsis Labs: Invalid input(s): PROCALCITONIN, LACTICIDVEN  Recent Results (from the past 240 hour(s))  Blood Culture (routine x 2)     Status: None (Preliminary result)   Collection Time: 07/19/19  6:00 PM   Specimen: BLOOD  Result Value Ref Range Status   Specimen Description   Final    BLOOD RIGHT WRIST Performed at [facility] [address], [place], [place] [address]    Special Requests   Final    BOTTLES DRAWN AEROBIC AND ANAEROBIC Blood Culture adequate volume Performed at [facility] [address], [place], [place] [address]    Culture   Final    NO GROWTH 2 DAYS Performed at  [facility] [address], [place], [place] [address]    Report Status PENDING  Incomplete  Blood Culture (routine x 2)     Status: None (Preliminary result)   Collection Time: 07/19/19  6:10 PM   Specimen: BLOOD  Result Value Ref Range Status   Specimen Description   Final    BLOOD RIGHT HAND Performed at [facility] [address] [place], [place] [address]    Special Requests   Final    BOTTLES DRAWN AEROBIC AND ANAEROBIC Blood Culture adequate volume Performed at [facility] [address], [place], [place] [address]    Culture   Final    NO GROWTH 2 DAYS Performed at [facility] [address], [place],  [address]    Report Status PENDING  Incomplete      Radiology Studies: Dg Chest Port 1 View  Result Date: 07/19/2019 CLINICAL DATA:  Shortness of breath, Covera positive 07/05/2019, hypoxia EXAM: PORTABLE CHEST 1 VIEW COMPARISON:  01/22/2015 FINDINGS: Lower lung volumes with increased mid and lower lung mixed interstitial and airspace opacities concerning for pneumonia. Stable heart size and vascularity. No effusion or pneumothorax. Trachea midline. Degenerative changes of the spine. IMPRESSION: Increased mid and lower lung mixed interstitial and peripheral airspace opacities concerning for developing pneumonia. Electronically Signed   By: Jerilynn Mages.  Shick M.D.   On: 07/19/2019 18:14    Marzetta Board, MD, PhD [facility]  Contact via  [email or web address]  [place] P: [phone] F: [phone]

## 2019-07-22 LAB — GLUCOSE, CAPILLARY
Glucose-Capillary: 167 mg/dL — ABNORMAL HIGH (ref 70–99)
Glucose-Capillary: 152 mg/dL — ABNORMAL HIGH (ref 70–99)
Glucose-Capillary: 135 mg/dL — ABNORMAL HIGH (ref 70–99)
Glucose-Capillary: 162 mg/dL — ABNORMAL HIGH (ref 70–99)

## 2019-07-22 LAB — COMPREHENSIVE METABOLIC PANEL
ALT: 27 U/L (ref 0–44)
AST: 21 U/L (ref 15–41)
Albumin: 3.1 g/dL — ABNORMAL LOW (ref 3.5–5.0)
Alkaline Phosphatase: 44 U/L (ref 38–126)
Anion gap: 10 (ref 5–15)
BUN: 26 mg/dL — ABNORMAL HIGH (ref 6–20)
CO2: 26 mmol/L (ref 22–32)
Calcium: 8.9 mg/dL (ref 8.9–10.3)
Chloride: 104 mmol/L (ref 98–111)
Creatinine, Ser: 0.94 mg/dL (ref 0.61–1.24)
GFR calc Af Amer: >60 mL/min (ref >60)
GFR calc non Af Amer: >60 mL/min (ref >60)
Glucose, Bld: 196 mg/dL — ABNORMAL HIGH (ref 70–99)
Potassium: 4.3 mmol/L (ref 3.5–5.1)
Sodium: 140 mmol/L (ref 135–145)
Total Bilirubin: 0.5 mg/dL (ref 0.3–1.2)
Total Protein: 7.1 g/dL (ref 6.5–8.1)

## 2019-07-22 LAB — CBC
HCT: 41.3 % (ref 39.0–52.0)
Hemoglobin: 13.4 g/dL (ref 13.0–17.0)
MCH: 28.5 pg (ref 26.0–34.0)
MCHC: 32.4 g/dL (ref 30.0–36.0)
MCV: 87.7 fL (ref 80.0–100.0)
Platelets: 393 10*3/uL (ref 150–400)
RBC: 4.71 MIL/uL (ref 4.22–5.81)
RDW: 12.7 % (ref 11.5–15.5)
WBC: 10.8 10*3/uL — ABNORMAL HIGH (ref 4.0–10.5)
nRBC: 0 % (ref 0.0–0.2)

## 2019-07-22 LAB — D-DIMER, QUANTITATIVE: D-Dimer, Quant: 0.93 ug/mL-FEU — ABNORMAL HIGH (ref 0.00–0.50)

## 2019-07-22 LAB — C-REACTIVE PROTEIN: CRP: 2.5 mg/dL — ABNORMAL HIGH (ref <1.0)

## 2019-07-22 MED ORDER — LOPERAMIDE HCL 2 MG PO CAPS
2.0000 mg | ORAL_CAPSULE | ORAL | Status: DC | PRN
  Administered 2019-07-22: 11:00:00 2 mg via ORAL
  Filled 2019-07-22: qty 1

## 2019-07-22 MED ORDER — FUROSEMIDE 10 MG/ML IJ SOLN
40.0000 mg | Freq: Once | INTRAMUSCULAR | Status: AC
  Administered 2019-07-22: 08:00:00 40 mg via INTRAVENOUS
  Filled 2019-07-22: qty 4

## 2019-07-22 NOTE — Progress Notes (Signed)
PROGRESS NOTE  [place] Lozano QHU:[id number] DOB: 01/14/1961 DOA: 07/19/2019 PCP: [name], MD   LOS: 3 days   Brief Narrative / Interim history: Pleasant 58 year old male with history of morbid obesity, hypertension, hyperlipidemia, type 2 diabetes mellitus, presents to the hospital and is admitted on 07/19/2019 with Covid symptoms for the past week and a half.  He has been having fatigue, fevers, since 07/06/2019.  He tested positive for COVID-19 on 07/10/2019.  His fever persisted up until a day or 2 prior to hospitalization, however in the last 3 days he has been having progressive shortness of breath and started to come to the ED.  He was hypoxic satting 84% on room air and a chest x-ray showed multifocal pneumonia.  Subjective / 24h Interval events: -Continues to improve, denies any shortness of breath and states that his breathing is improved.  No overnight events, still on oxygen this morning  Assessment & Plan: Principal Problem:   COVID-19 virus infection Active Problems:   Essential hypertension   Diabetes (HCC)   Principal Problem Acute Hypoxic Respiratory Failure due to Covid-19 Viral Illness -Patient was admitted to the hospital with respiratory failure/hypoxia requiring supplemental oxygen, due to COVID-19 pneumonia.  He was placed on Remdesivir and is to finish 5-day course on 10/19.  He was also placed on Decadron for 10 total days -10/6 received convalescent plasma, developed a mild allergic reaction with pruritic erythematous lesions on his skin which resolved with steroids, Benadryl, famotidine -Inflammatory markers improving -Give Lasix today and attempt to wean to room air, potentially discharge home on Monday  COVID-19 Labs  Recent Labs    07/19/19 1738  07/20/19 0810 07/21/19 0036 07/22/19 0037  DDIMER  --    < > 1.30* 1.08* 0.93*  FERRITIN 1,289*  --   --   --   --   LDH 231*  --   --   --   --   CRP 11.7*  --  9.2* 6.5* 2.5*   < > = values in  this interval not displayed.    Lab Results  Component Value Date   SARSCOV2NAA Detected (A) 07/10/2019    The treatment plan and use of medications and known side effects were discussed with patient/family, they were clearly explained that there is no proven definitive treatment for COVID-19 infection, any medications used here are based on published clinical articles/anecdotal data which are not peer-reviewed or randomized control trials.  Complete risks and long-term side effects are unknown, however in the best clinical judgment they seem to be of some clinical benefit rather than medical risks.  Patient agree with the treatment plan and want to receive the given medications.  Active Problems Type 2 diabetes mellitus -Hold home metformin and continue sliding scale -CBGs remain stable  CBG (last 3)  Recent Labs    07/21/19 1626 07/21/19 2102 07/22/19 0755  GLUCAP 177* 189* 167*   Hypertension -Continue lisinopril  Hyperlipidemia -Continue statin  Morbid obesity -Patient's BMI greater than 40, he would benefit from significant weight loss   CBG (last 3)  Recent Labs    07/21/19 1626 07/21/19 2102 07/22/19 0755  GLUCAP 177* 189* 167*    Scheduled Meds: . sodium chloride   Intravenous Once  . aspirin EC  81 mg Oral Daily  . dexamethasone (DECADRON) injection  6 mg Intravenous Q24H  . enoxaparin (LOVENOX) injection  60 mg Subcutaneous Q24H  . [START ON 07/25/2019] influenza vac split quadrivalent PF  0.5 mL Intramuscular  Tomorrow-1000  . insulin aspart  0-5 Units Subcutaneous QHS  . insulin aspart  0-9 Units Subcutaneous TID WC  . lisinopril  20 mg Oral Daily  . rosuvastatin  20 mg Oral Daily   Continuous Infusions: . remdesivir 100 mg in NS 250 mL Stopped (07/21/19 1652)   PRN Meds:.acetaminophen, diphenhydrAMINE, HYDROcodone-homatropine  DVT prophylaxis: Lovenox Code Status: Full code Family Communication: d/w patient and updated wife Disposition Plan:  home when ready   Consultants:  None   Procedures:  None   Microbiology: None   Antimicrobials: Ceftriaxone / Azithromycin 10/15 >> 10/16   Objective: Vitals:   07/21/19 1541 07/21/19 1927 07/22/19 0402 07/22/19 0740  BP: 119/78 113/72 133/80 (!) 145/83  Pulse: 76 86 78   Resp: 19 19 20    Temp: 98.4 F (36.9 C) (!) 97.5 F (36.4 C) 97.8 F (36.6 C) 97.7 F (36.5 C)  TempSrc: Oral Oral Oral Oral  SpO2: 93% 95% 96%   Weight:      Height:        Intake/Output Summary (Last 24 hours) at 07/22/2019 1003 Last data filed at 07/22/2019 0956 Gross per 24 hour  Intake 867.53 ml  Output -  Net 867.53 ml   Filed Weights   07/20/19 0135  Weight: 133.8 kg    Examination:  Constitutional: No distress Eyes: No scleral icterus seen ENMT: Moist mucous membranes Respiratory: Bibasilar rhonchi, no wheezing, no crackles Cardiovascular: Regular rate and rhythm, no murmurs appreciated.  No peripheral edema Abdomen: Soft, nontender, bowel sounds positive Musculoskeletal: no clubbing / cyanosis.  Skin: No rashes seen today Neurologic: Nonfocal, equal strength Psychiatric: Normal judgment and insight. Alert and oriented x 3. Normal mood.    Data Reviewed: I have independently reviewed following labs and imaging studies   CBC: Recent Labs  Lab 07/19/19 1738 07/20/19 0810 07/21/19 0036 07/22/19 0037  WBC 7.8 5.8 5.0 10.8*  NEUTROABS 6.6  --   --   --   HGB 14.8 14.1 13.6 13.4  HCT 44.7 42.4 41.6 41.3  MCV 87.0 86.4 87.4 87.7  PLT 320 320 346 393   Basic Metabolic Panel: Recent Labs  Lab 07/19/19 1738 07/20/19 0810 07/21/19 0036 07/22/19 0037  NA 137 138 140 140  K 3.9 4.3 4.3 4.3  CL 100 102 103 104  CO2 22 23 25 26   GLUCOSE 132* 161* 191* 196*  BUN 15 19 22* 26*  CREATININE 0.94 0.88 0.84 0.94  CALCIUM 8.5* 8.7* 8.9 8.9   GFR: Estimated Creatinine Clearance: 122.5 mL/min (by C-G formula based on SCr of 0.94 mg/dL). Liver Function Tests: Recent Labs   Lab 07/19/19 1738 07/20/19 0810 07/21/19 0036 07/22/19 0037  AST 28 25 28 21   ALT 28 26 29 27   ALKPHOS 47 47 48 44  BILITOT 1.0 0.9 0.7 0.5  PROT 7.8 7.5 7.4 7.1  ALBUMIN 3.2* 3.1* 3.1* 3.1*   No results for input(s): LIPASE, AMYLASE in the last 168 hours. No results for input(s): AMMONIA in the last 168 hours. Coagulation Profile: No results for input(s): INR, PROTIME in the last 168 hours. Cardiac Enzymes: No results for input(s): CKTOTAL, CKMB, CKMBINDEX, TROPONINI in the last 168 hours. BNP (last 3 results) No results for input(s): PROBNP in the last 8760 hours. HbA1C: No results for input(s): HGBA1C in the last 72 hours. CBG: Recent Labs  Lab 07/21/19 0721 07/21/19 1150 07/21/19 1626 07/21/19 2102 07/22/19 0755  GLUCAP 174* 197* 177* 189* 167*   Lipid Profile: Recent Labs  07/19/19 1738  TRIG 218*   Thyroid Function Tests: No results for input(s): TSH, T4TOTAL, FREET4, T3FREE, THYROIDAB in the last 72 hours. Anemia Panel: Recent Labs    07/19/19 1738  FERRITIN 1,289*   Urine analysis:    Component Value Date/Time   COLORURINE YELLOW 11/02/2018 1627   APPEARANCEUR CLEAR 11/02/2018 1627   LABSPEC >=1.030 (A) 11/02/2018 1627   PHURINE 6.0 11/02/2018 1627   GLUCOSEU 250 (A) 11/02/2018 1627   HGBUR TRACE-INTACT (A) 11/02/2018 1627   BILIRUBINUR NEGATIVE 11/02/2018 1627   KETONESUR NEGATIVE 11/02/2018 1627   UROBILINOGEN 0.2 11/02/2018 1627   NITRITE NEGATIVE 11/02/2018 1627   LEUKOCYTESUR NEGATIVE 11/02/2018 1627   Sepsis Labs: Invalid input(s): PROCALCITONIN, LACTICIDVEN  Recent Results (from the past 240 hour(s))  Blood Culture (routine x 2)     Status: None (Preliminary result)   Collection Time: 07/19/19  6:00 PM   Specimen: BLOOD  Result Value Ref Range Status   Specimen Description   Final    BLOOD RIGHT WRIST Performed at [facility] [address] [place], [place] [address]    Special Requests   Final     BOTTLES DRAWN AEROBIC AND ANAEROBIC Blood Culture adequate volume Performed at [facility] [address] [place], [place] [address]    Culture   Final    NO GROWTH 3 DAYS Performed at [facility], [place] [address], [place], [place] [address]    Report Status PENDING  Incomplete  Blood Culture (routine x 2)     Status: None (Preliminary result)   Collection Time: 07/19/19  6:10 PM   Specimen: BLOOD  Result Value Ref Range Status   Specimen Description   Final    BLOOD RIGHT HAND Performed at [facility] [address], [place], [place] [address]    Special Requests   Final    BOTTLES DRAWN AEROBIC AND ANAEROBIC Blood Culture adequate volume Performed at [facility] [address] [place], [place] [address]    Culture   Final    NO GROWTH 3 DAYS Performed at [facility], [place] [address], [place], [place] [address]    Report Status PENDING  Incomplete      Radiology Studies: No results found.  Marzetta Board, MD, PhD [facility]  Contact via  [email or web address]  [place] P: [phone] F: [phone]

## 2019-07-23 LAB — COMPREHENSIVE METABOLIC PANEL
ALT: 28 U/L (ref 0–44)
AST: 25 U/L (ref 15–41)
Albumin: 3.5 g/dL (ref 3.5–5.0)
Alkaline Phosphatase: 48 U/L (ref 38–126)
Anion gap: 16 — ABNORMAL HIGH (ref 5–15)
BUN: 27 mg/dL — ABNORMAL HIGH (ref 6–20)
CO2: 24 mmol/L (ref 22–32)
Calcium: 9 mg/dL (ref 8.9–10.3)
Chloride: 99 mmol/L (ref 98–111)
Creatinine, Ser: 1.14 mg/dL (ref 0.61–1.24)
GFR calc Af Amer: >60 mL/min (ref >60)
GFR calc non Af Amer: >60 mL/min (ref >60)
Glucose, Bld: 196 mg/dL — ABNORMAL HIGH (ref 70–99)
Potassium: 3.6 mmol/L (ref 3.5–5.1)
Sodium: 139 mmol/L (ref 135–145)
Total Bilirubin: 0.6 mg/dL (ref 0.3–1.2)
Total Protein: 7.8 g/dL (ref 6.5–8.1)

## 2019-07-23 LAB — CBC
HCT: 45.7 % (ref 39.0–52.0)
Hemoglobin: 14.7 g/dL (ref 13.0–17.0)
MCH: 28.5 pg (ref 26.0–34.0)
MCHC: 32.2 g/dL (ref 30.0–36.0)
MCV: 88.7 fL (ref 80.0–100.0)
Platelets: 533 10*3/uL — ABNORMAL HIGH (ref 150–400)
RBC: 5.15 MIL/uL (ref 4.22–5.81)
RDW: 12.9 % (ref 11.5–15.5)
WBC: 8.9 10*3/uL (ref 4.0–10.5)
nRBC: 0 % (ref 0.0–0.2)

## 2019-07-23 LAB — GLUCOSE, CAPILLARY
Glucose-Capillary: 178 mg/dL — ABNORMAL HIGH (ref 70–99)
Glucose-Capillary: 193 mg/dL — ABNORMAL HIGH (ref 70–99)
Glucose-Capillary: 141 mg/dL — ABNORMAL HIGH (ref 70–99)

## 2019-07-23 LAB — D-DIMER, QUANTITATIVE: D-Dimer, Quant: 0.91 ug/mL-FEU — ABNORMAL HIGH (ref 0.00–0.50)

## 2019-07-23 LAB — C-REACTIVE PROTEIN: CRP: 1.9 mg/dL — ABNORMAL HIGH (ref <1.0)

## 2019-07-23 MED ORDER — DEXAMETHASONE 6 MG PO TABS: 6.0000 mg | ORAL_TABLET | Freq: Every day | ORAL | 0 refills | Status: AC

## 2019-07-23 MED ORDER — FUROSEMIDE 10 MG/ML IJ SOLN
40.0000 mg | Freq: Once | INTRAMUSCULAR | Status: AC
  Administered 2019-07-23: 09:00:00 40 mg via INTRAVENOUS
  Filled 2019-07-23: qty 4

## 2019-07-23 MED ORDER — INFLUENZA VAC SPLIT QUAD 0.5 ML IM SUSY
0.5000 mL | PREFILLED_SYRINGE | Freq: Once | INTRAMUSCULAR | Status: AC
  Administered 2019-07-23: 16:00:00 0.5 mL via INTRAMUSCULAR
  Filled 2019-07-23: qty 0.5

## 2019-07-23 NOTE — Discharge Summary (Signed)
Physician Discharge Summary  Martin Lozano VQM:[id number] DOB: 07-06-1961 DOA: 07/19/2019  PCP: [name], MD  Admit date: 07/19/2019 Discharge date: 07/23/2019  Admitted From: home Disposition:  home  Recommendations for Outpatient Follow-up:  1. Follow up with PCP in 1-2 weeks 2. Please obtain BMP/CBC in one week  Home Health: none Equipment/Devices: none  Discharge Condition: stable CODE STATUS: Full code Diet recommendation: regular  HPI: Per admitting MD,  Martin Lozano  is a 58 y.o. male,w hypertension, hyperlipidemia, Dm2, Asthma, presents with c/o covid symptoms since 07/06/2019.  Pt noted fever to 101, achiness and cough (dry).  Pt had televisit on 07/09/2019 and was told to self isolate and also given hycodan cough syrup.  Pt tested covid-19 positive on 07/10/2019.  Pt states that his fever has been better and didn't have fever today but noted dyspnea with exertion.  Pox in Ed 84% on RA. In ED,  T 98.0  P 102  R 20, Bp 137/84  Pox 100% on o2 [place] CXR IMPRESSION: Increased mid and lower lung mixed interstitial and peripheral airspace opacities concerning for developing pneumonia. Na 137, K 3.9, Bun 15, Creatinine 0.94 Ast 28, Alt 28 Alb 3.2 LDH 231 Ferritin 1,289 Tg 218 Fibrinogen >800 Crp 11.7 D dimer 1.29 Blood culture x2 Pt will be admitted for covid-19. And cap.   Hospital Course / Discharge diagnoses: Acute Hypoxic Respiratory Failure due to Covid-19 Viral Pneumonia -Patient was admitted to the hospital with respiratory failure/hypoxia requiring supplemental oxygen, due to COVID-19 pneumonia.  He was placed on Remdesivir and finished a 5-day course while hospitalized.  He received convalescent plasma to which he developed a mild pruritic allergic type reaction on the skin that resolved with Benadryl.  He was also placed on Decadron for 10 total days with 5 days remaining at the time of discharge.  He will finish the course as an outpatient.  Clinically has improved, he is  able to ambulate on room air without significant difficulties/shortness of breath/chest pain.  He has returned to baseline and will be discharged home in stable condition.  COVID-19 Labs  Recent Labs    07/21/19 0036 07/22/19 0037 07/23/19 0515  DDIMER 1.08* 0.93* 0.91*  CRP 6.5* 2.5* 1.9*    Lab Results  Component Value Date   SARSCOV2NAA Detected (A) 07/10/2019   Type 2 diabetes mellitus -resume home medications on discharge.  Would benefit from weight loss CBG (last 3)  Recent Labs    07/22/19 2035 07/23/19 0825 07/23/19 1111  GLUCAP 162* 178* 193*   Hypertension -Continue lisinopril Hyperlipidemia -Continue statin Morbid obesity -Patient's BMI greater than 40, he would benefit from significant weight loss   Discharge Instructions   Allergies as of 07/23/2019   No Known Allergies     Medication List    TAKE these medications   aspirin 81 MG tablet Take 81 mg by mouth daily.   dexamethasone 6 MG tablet Commonly known as: DECADRON Take 1 tablet (6 mg total) by mouth daily for 5 days.   HYDROcodone-homatropine 5-1.5 MG/5ML syrup Commonly known as: HYCODAN Take 5-10 mLs by mouth every 8 (eight) hours as needed for cough.   lisinopril 20 MG tablet Commonly known as: ZESTRIL TAKE 1 TABLET EVERY DAY   metFORMIN 500 MG tablet Commonly known as: GLUCOPHAGE 2 tab by mouth in the AM, and 1 tab in the PM   rosuvastatin 20 MG tablet Commonly known as: CRESTOR TAKE 1 TABLET EVERY DAY  Follow-up Information    [name], MD. Schedule an appointment as soon as possible for a visit in 2 week(s).   Specialties: Internal Medicine, Radiology Contact information: [address] [place] [facility] [place] [address] [phone]           Consultations:  None   Procedures/Studies:  Dg Chest Port 1 View  Result Date: 07/19/2019 CLINICAL DATA:  Shortness of breath, Covera positive 07/05/2019, hypoxia EXAM: PORTABLE CHEST 1 VIEW COMPARISON:  01/22/2015  FINDINGS: Lower lung volumes with increased mid and lower lung mixed interstitial and airspace opacities concerning for pneumonia. Stable heart size and vascularity. No effusion or pneumothorax. Trachea midline. Degenerative changes of the spine. IMPRESSION: Increased mid and lower lung mixed interstitial and peripheral airspace opacities concerning for developing pneumonia. Electronically Signed   By: Judie Petit.  Shick M.D.   On: 07/19/2019 18:14      Subjective: - no chest pain, shortness of breath, no abdominal pain, nausea or vomiting.   Discharge Exam: BP 120/72 (BP Location: Right Arm)    Pulse 91    Temp 98.1 F (36.7 C) (Axillary)    Resp (!) 22    Ht  (1.803 m)    Wt 133.8 kg    SpO2 (!) 89%    BMI 41.14 kg/m   General: Pt is alert, awake, not in acute distress Cardiovascular: RRR, S1/S2 +, no rubs, no gallops Respiratory: CTA bilaterally, no wheezing, no rhonchi Abdominal: Soft, NT, ND, bowel sounds + Extremities: no edema, no cyanosis    The results of significant diagnostics from this hospitalization (including imaging, microbiology, ancillary and laboratory) are listed below for reference.     Microbiology: Recent Results (from the past 240 hour(s))  Blood Culture (routine x 2)     Status: None (Preliminary result)   Collection Time: 07/19/19  6:00 PM   Specimen: BLOOD  Result Value Ref Range Status   Specimen Description   Final    BLOOD RIGHT WRIST Performed at [facility], [address], [place], [place] [address]    Special Requests   Final    BOTTLES DRAWN AEROBIC AND ANAEROBIC Blood Culture adequate volume Performed at [facility], [address], [place], [place] [address]    Culture   Final    NO GROWTH 4 DAYS Performed at [facility], [address], [place], [place] [address]    Report Status PENDING  Incomplete  Blood Culture (routine x 2)     Status: None (Preliminary result)   Collection Time: 07/19/19   6:10 PM   Specimen: BLOOD  Result Value Ref Range Status   Specimen Description   Final    BLOOD RIGHT HAND Performed at [facility], [address] [place], [place] [address]    Special Requests   Final    BOTTLES DRAWN AEROBIC AND ANAEROBIC Blood Culture adequate volume Performed at [facility], [address], [place], [place] [address]    Culture   Final    NO GROWTH 4 DAYS Performed at [facility], [address], [place], [place] [address]    Report Status PENDING  Incomplete     Labs: BNP (last 3 results) No results for input(s): BNP in the last 8760 hours. Basic Metabolic Panel: Recent Labs  Lab 07/19/19 1738 07/20/19 0810 07/21/19 0036 07/22/19 0037 07/23/19 0515  NA 137 138 140 140 139  K 3.9 4.3 4.3 4.3 3.6  CL 100 102 103 104 99  CO2 22 23 25 26 24   GLUCOSE 132* 161* 191* 196* 196*  BUN 15 19 22* 26* 27*  CREATININE 0.94 0.88 0.84 0.94 1.14  CALCIUM 8.5* 8.7* 8.9 8.9 9.0   Liver Function Tests: Recent Labs  Lab 07/19/19 1738 07/20/19 0810 07/21/19 0036 07/22/19 0037 07/23/19 0515  AST 28 25 28 21 25   ALT 28 26 29 27 28   ALKPHOS 47 47 48 44 48  BILITOT 1.0 0.9 0.7 0.5 0.6  PROT 7.8 7.5 7.4 7.1 7.8  ALBUMIN 3.2* 3.1* 3.1* 3.1* 3.5   No results for input(s): LIPASE, AMYLASE in the last 168 hours. No results for input(s): AMMONIA in the last 168 hours. CBC: Recent Labs  Lab 07/19/19 1738 07/20/19 0810 07/21/19 0036 07/22/19 0037 07/23/19 0515  WBC 7.8 5.8 5.0 10.8* 8.9  NEUTROABS 6.6  --   --   --   --   HGB 14.8 14.1 13.6 13.4 14.7  HCT 44.7 42.4 41.6 41.3 45.7  MCV 87.0 86.4 87.4 87.7 88.7  PLT 320 320 346 393 533*   Cardiac Enzymes: No results for input(s): CKTOTAL, CKMB, CKMBINDEX, TROPONINI in the last 168 hours. BNP: Invalid input(s): POCBNP CBG: Recent Labs  Lab 07/22/19 1116 07/22/19 1650 07/22/19 2035 07/23/19 0825 07/23/19 1111  GLUCAP 152* 135* 162* 178* 193*    D-Dimer Recent Labs    07/22/19 0037 07/23/19 0515  DDIMER 0.93* 0.91*   Hgb A1c No results for input(s): HGBA1C in the last 72 hours. Lipid Profile No results for input(s): CHOL, HDL, LDLCALC, TRIG, CHOLHDL, LDLDIRECT in the last 72 hours. Thyroid function studies No results for input(s): TSH, T4TOTAL, T3FREE, THYROIDAB in the last 72 hours.  Invalid input(s): FREET3 Anemia work up No results for input(s): VITAMINB12, FOLATE, FERRITIN, TIBC, IRON, RETICCTPCT in the last 72 hours. Urinalysis    Component Value Date/Time   COLORURINE YELLOW 11/02/2018 1627   APPEARANCEUR CLEAR 11/02/2018 1627   LABSPEC >=1.030 (A) 11/02/2018 1627   PHURINE 6.0 11/02/2018 1627   GLUCOSEU 250 (A) 11/02/2018 1627   HGBUR TRACE-INTACT (A) 11/02/2018 1627   BILIRUBINUR NEGATIVE 11/02/2018 1627   KETONESUR NEGATIVE 11/02/2018 1627   UROBILINOGEN 0.2 11/02/2018 1627   NITRITE NEGATIVE 11/02/2018 1627   LEUKOCYTESUR NEGATIVE 11/02/2018 1627   Sepsis Labs Invalid input(s): PROCALCITONIN,  WBC,  LACTICIDVEN  FURTHER DISCHARGE INSTRUCTIONS:   Get Medicines reviewed and adjusted: Please take all your medications with you for your next visit with your Primary MD   Laboratory/radiological data: Please request your Primary MD to go over all hospital tests and procedure/radiological results at the follow up, please ask your Primary MD to get all Hospital records sent to his/her office.   In some cases, they will be blood work, cultures and biopsy results pending at the time of your discharge. Please request that your primary care M.D. goes through all the records of your hospital data and follows up on these results.   Also Note the following: If you experience worsening of your admission symptoms, develop shortness of breath, life threatening emergency, suicidal or homicidal thoughts you must seek medical attention immediately by calling 911 or calling your MD immediately  if symptoms less severe.    You must read complete instructions/literature along with all the possible adverse reactions/side effects for all the Medicines you take and that have been prescribed to you. Take any new Medicines after you have completely understood and accpet all the possible adverse reactions/side  effects.    Do not drive when taking Pain medications or sleeping medications (Benzodaizepines)   Do not take more than prescribed Pain, Sleep and Anxiety Medications. It is not advisable to combine anxiety,sleep and pain medications without talking with your primary care practitioner   Special Instructions: If you have smoked or chewed Tobacco  in the last 2 yrs please stop smoking, stop any regular Alcohol  and or any Recreational drug use.   Wear Seat belts while driving.   Please note: You were cared for by a hospitalist during your hospital stay. Once you are discharged, your primary care physician will handle any further medical issues. Please note that NO REFILLS for any discharge medications will be authorized once you are discharged, as it is imperative that you return to your primary care physician (or establish a relationship with a primary care physician if you do not have one) for your post hospital discharge needs so that they can reassess your need for medications and monitor your lab values.  Time coordinating discharge: 35 minutes  SIGNED:  Pamella Pertostin Fabiha Rougeau, MD, PhD 07/23/2019, 1:47 PM

## 2019-07-23 NOTE — Progress Notes (Signed)
Discharge instructions given and patient verifies understanding. No acute distress noted. Patient left via personal vehicle with son.

## 2019-07-23 NOTE — Plan of Care (Signed)
Will continue with plan of care. 

## 2019-07-23 NOTE — Progress Notes (Signed)
SATURATION QUALIFICATIONS: (This note is used to comply with regulatory documentation for home oxygen)  Patient Saturations on Room Air at Rest = 96%  Patient Saturations on Room Air while Ambulating = 89%  Patient Saturations on 2 Liters of oxygen while Ambulating = 96%  Please briefly explain why patient needs home oxygen:patient states he does not feel short of breathe with exertion.

## 2019-07-24 LAB — CULTURE, BLOOD (ROUTINE X 2)
Culture: NO GROWTH
Culture: NO GROWTH
Special Requests: ADEQUATE
Special Requests: ADEQUATE

## 2019-07-25 ENCOUNTER — Telehealth: Payer: Self-pay | Admitting: *Deleted

## 2019-07-25 NOTE — Telephone Encounter (Signed)
Transition Care Management Follow-up Telephone Call   Date discharged? 07/23/19   How have you been since you were released from the hospital? Pt states he is doing ok he's just tired   Do you understand why you were in the hospital? YES   Do you understand the discharge instructions? YES   Where were you discharged to? Home   Items Reviewed:  Medications reviewed: YES, currently taking decadron for 5 days  Allergies reviewed: YES, he states no changes  Dietary changes reviewed: YES  Referrals reviewed: No referral recommeded   Functional Questionnaire:   Activities of Daily Living (ADLs):   He states he are independent in the following: ambulation, bathing and hygiene, feeding, continence, grooming, toileting and dressing States he doesn't require assistance    Any transportation issues/concerns?: NO   Any patient concerns? NO   Confirmed importance and date/time of follow-up visits scheduled YES, virtual 07/31/19  Provider Appointment booked with Dr. Jenny Reichmann  Confirmed with patient if condition begins to worsen call PCP or go to the ER.  Patient was given the office number and encouraged to call back with question or concerns.  : YES

## 2019-07-31 ENCOUNTER — Ambulatory Visit (INDEPENDENT_AMBULATORY_CARE_PROVIDER_SITE_OTHER): Payer: Managed Care, Other (non HMO) | Admitting: Internal Medicine

## 2019-07-31 DIAGNOSIS — Z0000 Encounter for general adult medical examination without abnormal findings: Secondary | ICD-10-CM

## 2019-07-31 DIAGNOSIS — E119 Type 2 diabetes mellitus without complications: Secondary | ICD-10-CM

## 2019-07-31 DIAGNOSIS — E559 Vitamin D deficiency, unspecified: Secondary | ICD-10-CM

## 2019-07-31 DIAGNOSIS — U071 COVID-19: Secondary | ICD-10-CM

## 2019-07-31 DIAGNOSIS — E611 Iron deficiency: Secondary | ICD-10-CM

## 2019-07-31 DIAGNOSIS — J1282 Pneumonia due to coronavirus disease 2019: Secondary | ICD-10-CM

## 2019-07-31 DIAGNOSIS — E538 Deficiency of other specified B group vitamins: Secondary | ICD-10-CM

## 2019-07-31 DIAGNOSIS — J1289 Other viral pneumonia: Secondary | ICD-10-CM

## 2019-07-31 NOTE — Patient Instructions (Addendum)
Your repeat COVID test was entered in the setting  Please continue all other medications as before, and refills have been done if requested.  Please have the pharmacy call with any other refills you may need.  Please continue your efforts at being more active, low cholesterol diet, and weight control  Please keep your appointments with your specialists as you may have planned  Please return in 3 months, or sooner if needed, with Lab testing done 3-5 days before

## 2019-07-31 NOTE — Progress Notes (Signed)
Patient ID: Martin Lozano, male   DOB: [birth year], 58 y.o.   MRN: [id number]  Virtual Visit via Video Note  I connected with Martin Lozano on 08/02/19 at  1:00 PM EDT by a video enabled telemedicine application and verified that I am speaking with the correct person using two identifiers.  Location: Patient: at home Provider: at office   I discussed the limitations of evaluation and management by telemedicine and the availability of in person appointments. The patient expressed understanding and agreed to proceed.  History of Present Illness: Here to f/u post covid pna hospn, doing well Pt denies chest pain, increased sob or doe, wheezing, orthopnea, PND, increased LE swelling, palpitations, dizziness or syncope.  Still with significant fatigue, general weakness  But every day stamina seems some better..  Denies worsening reflux, abd pain, dysphagia, n/v, or blood and diarrhea seems resolved today.  Denies urinary symptoms such as dysuria, frequency, urgency, flank pain, hematuria or n/v, fever, chills.    Needs repeat COVID to go back to work but has a plan to get this done.   Past Medical History:  Diagnosis Date  . ALLERGIC RHINITIS 09/06/2007  . ASTHMATIC BRONCHITIS, ACUTE 11/16/2007  . HEMORRHOIDS 07/03/2007  . HYPERLIPIDEMIA 09/06/2007  . HYPERTENSION 07/03/2007  . KNEE SPRAIN, LEFT 07/30/2008  . OBESITY, MORBID 07/03/2007  . Other tenosynovitis of hand and wrist 06/11/2010  . OTITIS MEDIA, ACUTE, LEFT 11/10/2009  . Type II or unspecified type diabetes mellitus without mention of complication, uncontrolled 09/25/2012  . Wheezing 09/09/2009   No past surgical history on file.  reports that he has never smoked. He has never used smokeless tobacco. He reports current alcohol use. He reports that he does not use drugs. family history is not on file. No Known Allergies Current Outpatient Medications on File Prior to Visit  Medication Sig Dispense Refill  . aspirin 81 MG tablet Take 81 mg  by mouth daily.      Marland Kitchen HYDROcodone-homatropine (HYCODAN) 5-1.5 MG/5ML syrup Take 5-10 mLs by mouth every 8 (eight) hours as needed for cough. 120 mL 0  . lisinopril (ZESTRIL) 20 MG tablet TAKE 1 TABLET EVERY DAY (Patient taking differently: Take 20 mg by mouth daily. ) 90 tablet 3  . metFORMIN (GLUCOPHAGE) 500 MG tablet 2 tab by mouth in the AM, and 1 tab in the PM 270 tablet 3  . rosuvastatin (CRESTOR) 20 MG tablet TAKE 1 TABLET EVERY DAY (Patient taking differently: Take 20 mg by mouth daily. ) 90 tablet 2   No current facility-administered medications on file prior to visit.     Observations/Objective: Alert, NAD, appropriate mood and affect, resps normal, cn 2-12 intact, moves all 4s, no visible rash or swelling Lab Results  Component Value Date   WBC 8.9 07/23/2019   HGB 14.7 07/23/2019   HCT 45.7 07/23/2019   PLT 533 (H) 07/23/2019   GLUCOSE 196 (H) 07/23/2019   CHOL 141 11/02/2018   TRIG 218 (H) 07/19/2019   HDL 27.30 (L) 11/02/2018   LDLDIRECT 88.0 11/02/2018   LDLCALC 80 02/25/2017   ALT 28 07/23/2019   AST 25 07/23/2019   NA 139 07/23/2019   K 3.6 07/23/2019   CL 99 07/23/2019   CREATININE 1.14 07/23/2019   BUN 27 (H) 07/23/2019   CO2 24 07/23/2019   TSH 1.25 11/02/2018   PSA 0.45 11/02/2018   HGBA1C 7.2 (H) 11/02/2018   MICROALBUR 0.8 11/02/2018   Assessment and Plan: See notes  Follow Up  Instructions: See notes   I discussed the assessment and treatment plan with the patient. The patient was provided an opportunity to ask questions and all were answered. The patient agreed with the plan and demonstrated an understanding of the instructions.   The patient was advised to call back or seek an in-person evaluation if the symptoms worsen or if the condition fails to improve as anticipated.  Cathlean Cower, MD

## 2019-08-02 ENCOUNTER — Encounter: Payer: Self-pay | Admitting: Internal Medicine

## 2019-08-02 NOTE — Assessment & Plan Note (Signed)
stable overall by history and exam, recent data reviewed with pt, and pt to continue medical treatment as before,  to f/u any worsening symptoms or concerns  

## 2019-08-02 NOTE — Assessment & Plan Note (Signed)
Improved post covid symptoms, pt has plan for retesting then return to work with note

## 2019-08-06 ENCOUNTER — Other Ambulatory Visit: Payer: Self-pay

## 2019-08-06 DIAGNOSIS — K624 Stenosis of anus and rectum: Secondary | ICD-10-CM

## 2019-08-08 ENCOUNTER — Encounter: Payer: Self-pay | Admitting: Internal Medicine

## 2019-08-08 LAB — NOVEL CORONAVIRUS, NAA: SARS-CoV-2, NAA: NOT DETECTED

## 2019-09-11 ENCOUNTER — Ambulatory Visit (INDEPENDENT_AMBULATORY_CARE_PROVIDER_SITE_OTHER): Payer: Managed Care, Other (non HMO) | Admitting: Internal Medicine

## 2019-09-11 ENCOUNTER — Encounter: Payer: Self-pay | Admitting: Internal Medicine

## 2019-09-11 DIAGNOSIS — J309 Allergic rhinitis, unspecified: Secondary | ICD-10-CM | POA: Diagnosis not present

## 2019-09-11 DIAGNOSIS — J069 Acute upper respiratory infection, unspecified: Secondary | ICD-10-CM | POA: Diagnosis not present

## 2019-09-11 DIAGNOSIS — E119 Type 2 diabetes mellitus without complications: Secondary | ICD-10-CM | POA: Diagnosis not present

## 2019-09-11 MED ORDER — HYDROCODONE-HOMATROPINE 5-1.5 MG/5ML PO SYRP: 5.0000 mL | ORAL_SOLUTION | Freq: Three times a day (TID) | ORAL | 0 refills | Status: DC | PRN

## 2019-09-11 MED ORDER — LEVOFLOXACIN 500 MG PO TABS: 500.0000 mg | ORAL_TABLET | Freq: Every day | ORAL | 0 refills | Status: AC

## 2019-09-11 NOTE — Patient Instructions (Signed)
Please take all new medication as prescribed  Please continue all other medications as before, and refills have been done if requested.  Please have the pharmacy call with any other refills you may need.  Please continue your efforts at being more active, low cholesterol diet, and weight control.  Please keep your appointments with your specialists as you may have planned    

## 2019-09-11 NOTE — Assessment & Plan Note (Signed)
stable overall by history and exam, recent data reviewed with pt, and pt to continue medical treatment as before,  to f/u any worsening symptoms or concerns  

## 2019-09-11 NOTE — Progress Notes (Signed)
Patient ID: Martin Lozano, male   DOB: December 15, 1960, 58 y.o.   MRN: [id number]  Virtual Visit via Video Note  I connected with Martin Lozano on 09/11/19 at  2:00 PM EST by a video enabled telemedicine application and verified that I am speaking with the correct person using two identifiers.  Location: Patient: at home Provider: at office  I discussed the limitations of evaluation and management by telemedicine and the availability of in person appointments. The patient expressed understanding and agreed to proceed.  History of Present Illness:  Here with 2-3 days acute onset fever, facial pain, pressure, headache, general weakness and malaise, and greenish d/c, with mild ST and cough, but pt denies chest pain, wheezing, increased sob or doe, orthopnea, PND, increased LE swelling, palpitations, dizziness or syncope.  COVID neg several days ago. Pt denies new neurological symptoms such as new headache, or facial or extremity weakness or numbness   Pt denies polydipsia, polyuria No worsening recent allergy symptoms with good med compliance Past Medical History:  Diagnosis Date  . ALLERGIC RHINITIS 09/06/2007  . ASTHMATIC BRONCHITIS, ACUTE 11/16/2007  . HEMORRHOIDS 07/03/2007  . HYPERLIPIDEMIA 09/06/2007  . HYPERTENSION 07/03/2007  . KNEE SPRAIN, LEFT 07/30/2008  . OBESITY, MORBID 07/03/2007  . Other tenosynovitis of hand and wrist 06/11/2010  . OTITIS MEDIA, ACUTE, LEFT 11/10/2009  . Type II or unspecified type diabetes mellitus without mention of complication, uncontrolled 09/25/2012  . Wheezing 09/09/2009   No past surgical history on file.  reports that he has never smoked. He has never used smokeless tobacco. He reports current alcohol use. He reports that he does not use drugs. family history is not on file. No Known Allergies Current Outpatient Medications on File Prior to Visit  Medication Sig Dispense Refill  . aspirin 81 MG tablet Take 81 mg by mouth daily.      Marland Kitchen lisinopril (ZESTRIL)  20 MG tablet TAKE 1 TABLET EVERY DAY (Patient taking differently: Take 20 mg by mouth daily. ) 90 tablet 3  . metFORMIN (GLUCOPHAGE) 500 MG tablet 2 tab by mouth in the AM, and 1 tab in the PM 270 tablet 3  . rosuvastatin (CRESTOR) 20 MG tablet TAKE 1 TABLET EVERY DAY (Patient taking differently: Take 20 mg by mouth daily. ) 90 tablet 2   No current facility-administered medications on file prior to visit.     Observations/Objective: Alert, NAD, appropriate mood and affect, resps normal, cn 2-12 intact, moves all 4s, no visible rash or swelling Lab Results  Component Value Date   WBC 8.9 07/23/2019   HGB 14.7 07/23/2019   HCT 45.7 07/23/2019   PLT 533 (H) 07/23/2019   GLUCOSE 196 (H) 07/23/2019   CHOL 141 11/02/2018   TRIG 218 (H) 07/19/2019   HDL 27.30 (L) 11/02/2018   LDLDIRECT 88.0 11/02/2018   LDLCALC 80 02/25/2017   ALT 28 07/23/2019   AST 25 07/23/2019   NA 139 07/23/2019   K 3.6 07/23/2019   CL 99 07/23/2019   CREATININE 1.14 07/23/2019   BUN 27 (H) 07/23/2019   CO2 24 07/23/2019   TSH 1.25 11/02/2018   PSA 0.45 11/02/2018   HGBA1C 7.2 (H) 11/02/2018   MICROALBUR 0.8 11/02/2018   Assessment and Plan: See notes  Follow Up Instructions: See notes   I discussed the assessment and treatment plan with the patient. The patient was provided an opportunity to ask questions and all were answered. The patient agreed with the plan and demonstrated an understanding  of the instructions.   The patient was advised to call back or seek an in-person evaluation if the symptoms worsen or if the condition fails to improve as anticipated   Cathlean Cower, MD

## 2019-09-11 NOTE — Assessment & Plan Note (Signed)
Mild to mod, for antibx course,  to f/u any worsening symptoms or concerns 

## 2019-10-12 ENCOUNTER — Other Ambulatory Visit: Payer: Self-pay | Admitting: Internal Medicine

## 2019-10-12 NOTE — Telephone Encounter (Signed)
Please refill as per office routine med refill policy (all routine meds refilled for 3 mo or monthly per pt preference up to one year from last visit, then month to month grace period for 3 mo, then further med refills will have to be denied)  

## 2019-10-24 ENCOUNTER — Telehealth: Payer: Self-pay | Admitting: Internal Medicine

## 2019-10-24 ENCOUNTER — Encounter: Payer: Self-pay | Admitting: Internal Medicine

## 2019-10-24 NOTE — Telephone Encounter (Signed)
I am not sure as theoretically he is protected by his natural antibodies, but this may not be exactly true.  The safe thing would be to self isolate for 10 days

## 2019-10-24 NOTE — Telephone Encounter (Signed)
Copied from CRM [id number]. Topic: General - Other >> Oct 23, 2019  5:15 PM Marylen Ponto wrote: Reason for CRM: Pt stated he was exposed to someone with Covid on last Wednesday and the person was diagnosed as positive this past Saturday. Pt stated since he already tested positive in October he would like to know if he should be tested again or if he should just quarantine. Pt stated he had a discussion with Dr. Jonny Ruiz about this but he would like to make sure he understood and does the right thing. Pt requests call back

## 2019-10-24 NOTE — Telephone Encounter (Signed)
Pt informed of below.  

## 2019-10-24 NOTE — Telephone Encounter (Signed)
Left message for patient to call back to advise of below.   

## 2020-01-10 ENCOUNTER — Other Ambulatory Visit: Payer: Self-pay | Admitting: Internal Medicine

## 2020-01-10 NOTE — Telephone Encounter (Signed)
Please refill as per office routine med refill policy (all routine meds refilled for 3 mo or monthly per pt preference up to one year from last visit, then month to month grace period for 3 mo, then further med refills will have to be denied)  

## 2020-01-18 ENCOUNTER — Other Ambulatory Visit (INDEPENDENT_AMBULATORY_CARE_PROVIDER_SITE_OTHER): Payer: Managed Care, Other (non HMO)

## 2020-01-18 DIAGNOSIS — E559 Vitamin D deficiency, unspecified: Secondary | ICD-10-CM | POA: Diagnosis not present

## 2020-01-18 DIAGNOSIS — E538 Deficiency of other specified B group vitamins: Secondary | ICD-10-CM

## 2020-01-18 DIAGNOSIS — E611 Iron deficiency: Secondary | ICD-10-CM

## 2020-01-18 LAB — VITAMIN B12: Vitamin B-12: 192 pg/mL — ABNORMAL LOW (ref 211–911)

## 2020-01-18 LAB — IBC PANEL
Iron: 61 ug/dL (ref 42–165)
Saturation Ratios: 15.2 % — ABNORMAL LOW (ref 20.0–50.0)
Transferrin: 286 mg/dL (ref 212.0–360.0)

## 2020-01-18 LAB — VITAMIN D 25 HYDROXY (VIT D DEFICIENCY, FRACTURES): VITD: 16.38 ng/mL — ABNORMAL LOW (ref 30.00–100.00)

## 2020-01-25 ENCOUNTER — Other Ambulatory Visit: Payer: Self-pay | Admitting: Internal Medicine

## 2020-01-25 ENCOUNTER — Other Ambulatory Visit: Payer: Self-pay

## 2020-01-25 ENCOUNTER — Ambulatory Visit: Payer: Managed Care, Other (non HMO) | Admitting: Internal Medicine

## 2020-01-25 VITALS — BP 128/80 | HR 79 | Temp 98.1°F | Ht 71.0 in | Wt 314.0 lb

## 2020-01-25 DIAGNOSIS — E538 Deficiency of other specified B group vitamins: Secondary | ICD-10-CM

## 2020-01-25 DIAGNOSIS — Z0000 Encounter for general adult medical examination without abnormal findings: Secondary | ICD-10-CM

## 2020-01-25 DIAGNOSIS — E559 Vitamin D deficiency, unspecified: Secondary | ICD-10-CM | POA: Diagnosis not present

## 2020-01-25 DIAGNOSIS — Z114 Encounter for screening for human immunodeficiency virus [HIV]: Secondary | ICD-10-CM

## 2020-01-25 DIAGNOSIS — E119 Type 2 diabetes mellitus without complications: Secondary | ICD-10-CM | POA: Diagnosis not present

## 2020-01-25 LAB — BASIC METABOLIC PANEL
BUN: 11 mg/dL (ref 6–23)
CO2: 31 mEq/L (ref 19–32)
Calcium: 9.3 mg/dL (ref 8.4–10.5)
Chloride: 100 mEq/L (ref 96–112)
Creatinine, Ser: 0.92 mg/dL (ref 0.40–1.50)
GFR: 84.68 mL/min (ref >60.00)
Glucose, Bld: 130 mg/dL — ABNORMAL HIGH (ref 70–99)
Potassium: 4.6 mEq/L (ref 3.5–5.1)
Sodium: 139 mEq/L (ref 135–145)

## 2020-01-25 LAB — PSA: PSA: 0.27 ng/mL (ref 0.10–4.00)

## 2020-01-25 LAB — CBC WITH DIFFERENTIAL/PLATELET
Basophils Absolute: 0 10*3/uL (ref 0.0–0.1)
Basophils Relative: 0.3 % (ref 0.0–3.0)
Eosinophils Absolute: 0.4 10*3/uL (ref 0.0–0.7)
Eosinophils Relative: 3.9 % (ref 0.0–5.0)
HCT: 43.3 % (ref 39.0–52.0)
Hemoglobin: 14.5 g/dL (ref 13.0–17.0)
Lymphocytes Relative: 30 % (ref 12.0–46.0)
Lymphs Abs: 3.1 10*3/uL (ref 0.7–4.0)
MCHC: 33.4 g/dL (ref 30.0–36.0)
MCV: 88.2 fl (ref 78.0–100.0)
Monocytes Absolute: 0.6 10*3/uL (ref 0.1–1.0)
Monocytes Relative: 5.5 % (ref 3.0–12.0)
Neutro Abs: 6.1 10*3/uL (ref 1.4–7.7)
Neutrophils Relative %: 60.3 % (ref 43.0–77.0)
Platelets: 247 10*3/uL (ref 150.0–400.0)
RBC: 4.91 Mil/uL (ref 4.22–5.81)
RDW: 13.6 % (ref 11.5–15.5)
WBC: 10.2 10*3/uL (ref 4.0–10.5)

## 2020-01-25 LAB — URINALYSIS, ROUTINE W REFLEX MICROSCOPIC
Bilirubin Urine: NEGATIVE
Hgb urine dipstick: NEGATIVE
Ketones, ur: NEGATIVE
Leukocytes,Ua: NEGATIVE
Nitrite: NEGATIVE
RBC / HPF: NONE SEEN (ref 0–?)
Specific Gravity, Urine: 1.025 (ref 1.000–1.030)
Total Protein, Urine: NEGATIVE
Urine Glucose: 500 — AB
Urobilinogen, UA: 0.2 (ref 0.0–1.0)
pH: 5.5 (ref 5.0–8.0)

## 2020-01-25 LAB — LIPID PANEL
Cholesterol: 153 mg/dL (ref 0–200)
HDL: 32.3 mg/dL — ABNORMAL LOW (ref >39.00)
LDL Cholesterol: 89 mg/dL (ref 0–99)
NonHDL: 120.45
Total CHOL/HDL Ratio: 5
Triglycerides: 156 mg/dL — ABNORMAL HIGH (ref 0.0–149.0)
VLDL: 31.2 mg/dL (ref 0.0–40.0)

## 2020-01-25 LAB — HEPATIC FUNCTION PANEL
ALT: 20 U/L (ref 0–53)
AST: 17 U/L (ref 0–37)
Albumin: 4.2 g/dL (ref 3.5–5.2)
Alkaline Phosphatase: 58 U/L (ref 39–117)
Bilirubin, Direct: 0.1 mg/dL (ref 0.0–0.3)
Total Bilirubin: 0.4 mg/dL (ref 0.2–1.2)
Total Protein: 6.8 g/dL (ref 6.0–8.3)

## 2020-01-25 LAB — TSH: TSH: 1.73 u[IU]/mL (ref 0.35–4.50)

## 2020-01-25 LAB — MICROALBUMIN / CREATININE URINE RATIO
Creatinine,U: 126.9 mg/dL
Microalb Creat Ratio: 1.9 mg/g (ref 0.0–30.0)
Microalb, Ur: 2.4 mg/dL — ABNORMAL HIGH (ref 0.0–1.9)

## 2020-01-25 LAB — HEMOGLOBIN A1C: Hgb A1c MFr Bld: 8.2 % — ABNORMAL HIGH (ref 4.6–6.5)

## 2020-01-25 MED ORDER — GLIPIZIDE ER 2.5 MG PO TB24: 2.5000 mg | ORAL_TABLET | Freq: Every day | ORAL | 3 refills | Status: DC

## 2020-01-25 MED ORDER — VITAMIN B-12 1000 MCG PO TABS: 1000.0000 ug | ORAL_TABLET | Freq: Every day | ORAL | 3 refills | Status: DC

## 2020-01-25 MED ORDER — METFORMIN HCL 500 MG PO TABS: ORAL_TABLET | ORAL | 3 refills | Status: DC

## 2020-01-25 MED ORDER — VITAMIN D (ERGOCALCIFEROL) 1.25 MG (50000 UNIT) PO CAPS: 50000.0000 [IU] | ORAL_CAPSULE | ORAL | 0 refills | Status: DC

## 2020-01-25 NOTE — Patient Instructions (Addendum)
Please go online to [email or web address] to schedule your vaccination  Please take Vitamin D 63846 units weekly for 12 weeks, then plan to change to OTC Vitamin D3 at 2000 units per day, indefinitely.  Please take all new medication as prescribed  - the B12 pill   Please continue all other medications as before, and refills have been done if requested.  Please have the pharmacy call with any other refills you may need.  Please continue your efforts at being more active, low cholesterol diet, and weight control.  You are otherwise up to date with prevention measures today.  Please keep your appointments with your specialists as you may have planned  Please go to the LAB at the blood drawing area for the tests to be done  You will be contacted by phone if any changes need to be made immediately.  Otherwise, you will receive a letter about your results with an explanation, but please check with MyChart first.  Please remember to sign up for MyChart if you have not done so, as this will be important to you in the future with finding out test results, communicating by private email, and scheduling acute appointments online when needed.  Please make an Appointment to return in 6 months, or sooner if needed, also with Lab Appointment for testing done 3-5 days before at the FIRST FLOOR Lab (so this is for TWO appointments - please see the scheduling desk as you leave)

## 2020-01-25 NOTE — Progress Notes (Signed)
Subjective:    Patient ID: Martin Lozano, male    DOB: 1961-03-15, 58 y.o.   MRN: [id number]  HPI  Here for wellness and f/u;  Overall doing ok;  Pt denies Chest pain, worsening SOB, DOE, wheezing, orthopnea, PND, worsening LE edema, palpitations, dizziness or syncope.  Pt denies neurological change such as new headache, facial or extremity weakness.  Pt denies polydipsia, polyuria, or low sugar symptoms. Pt states overall good compliance with treatment and medications, good tolerability, and has been trying to follow appropriate diet.  Pt denies worsening depressive symptoms, suicidal ideation or panic. No fever, night sweats, loss of appetite, or other constitutional symptoms.  Pt states good ability with ADL's, has low fall risk, home safety reviewed and adequate, no other significant changes in hearing or vision, and only occasionally active with exercise. Lost some wt with covid infection, then regained.  Wt Readings from Last 3 Encounters:  01/25/20 (!) 314 lb (142.4 kg)  07/20/19 295 lb (133.8 kg)  11/22/18 (!) 316 lb (143.3 kg)  Wife with post second shot covid vaccination reaction yesterday, so he is not sure about things.  Still c/o joint pain "like im a hundred years old" post covid himself.  Past Medical History:  Diagnosis Date  . ALLERGIC RHINITIS 09/06/2007  . ASTHMATIC BRONCHITIS, ACUTE 11/16/2007  . HEMORRHOIDS 07/03/2007  . HYPERLIPIDEMIA 09/06/2007  . HYPERTENSION 07/03/2007  . KNEE SPRAIN, LEFT 07/30/2008  . OBESITY, MORBID 07/03/2007  . Other tenosynovitis of hand and wrist 06/11/2010  . OTITIS MEDIA, ACUTE, LEFT 11/10/2009  . Type II or unspecified type diabetes mellitus without mention of complication, uncontrolled 09/25/2012  . Wheezing 09/09/2009   No past surgical history on file.  reports that he has never smoked. He has never used smokeless tobacco. He reports current alcohol use. He reports that he does not use drugs. family history is not on file. No Known  Allergies Current Outpatient Medications on File Prior to Visit  Medication Sig Dispense Refill  . aspirin 81 MG tablet Take 81 mg by mouth daily.      Marland Kitchen HYDROcodone-homatropine (HYCODAN) 5-1.5 MG/5ML syrup Take 5-10 mLs by mouth every 8 (eight) hours as needed for cough. 180 mL 0  . lisinopril (ZESTRIL) 20 MG tablet TAKE 1 TABLET BY MOUTH EVERY DAY 90 tablet 3  . rosuvastatin (CRESTOR) 20 MG tablet Take 1 tablet (20 mg total) by mouth daily. 90 tablet 3   No current facility-administered medications on file prior to visit.   Review of Systems All otherwise neg per pt     Objective:   Physical Exam BP 128/80 (BP Location: Left Arm, Patient Position: Sitting, Cuff Size: Large)   Pulse 79   Temp 98.1 F (36.7 C) (Oral)   Ht 5\' 11"  (1.803 m)   Wt (!) 314 lb (142.4 kg)   SpO2 99%   BMI 43.79 kg/m  VS noted,  Constitutional: Pt appears in NAD HENT: Head: NCAT.  Right Ear: External ear normal.  Left Ear: External ear normal.  Eyes: . Pupils are equal, round, and reactive to light. Conjunctivae and EOM are normal Nose: without d/c or deformity Neck: Neck supple. Gross normal ROM Cardiovascular: Normal rate and regular rhythm.   Pulmonary/Chest: Effort normal and breath sounds without rales or wheezing.  Abd:  Soft, NT, ND, + BS, no organomegaly Neurological: Pt is alert. At baseline orientation, motor grossly intact Skin: Skin is warm. No rashes, other new lesions, no LE edema Psychiatric: Pt behavior  is normal without agitation  All otherwise neg per pt  Lab Results  Component Value Date   WBC 10.2 01/25/2020   HGB 14.5 01/25/2020   HCT 43.3 01/25/2020   PLT 247.0 01/25/2020   GLUCOSE 130 (H) 01/25/2020   CHOL 153 01/25/2020   TRIG 156.0 (H) 01/25/2020   HDL 32.30 (L) 01/25/2020   LDLDIRECT 88.0 11/02/2018   LDLCALC 89 01/25/2020   ALT 20 01/25/2020   AST 17 01/25/2020   NA 139 01/25/2020   K 4.6 01/25/2020   CL 100 01/25/2020   CREATININE 0.92 01/25/2020   BUN 11  01/25/2020   CO2 31 01/25/2020   TSH 1.73 01/25/2020   PSA 0.27 01/25/2020   HGBA1C 8.2 (H) 01/25/2020   MICROALBUR 2.4 (H) 01/25/2020      Assessment & Plan:

## 2020-01-26 ENCOUNTER — Encounter: Payer: Self-pay | Admitting: Internal Medicine

## 2020-01-26 DIAGNOSIS — E559 Vitamin D deficiency, unspecified: Secondary | ICD-10-CM | POA: Insufficient documentation

## 2020-01-26 DIAGNOSIS — E538 Deficiency of other specified B group vitamins: Secondary | ICD-10-CM | POA: Insufficient documentation

## 2020-01-26 LAB — HIV ANTIBODY (ROUTINE TESTING W REFLEX): HIV 1&2 Ab, 4th Generation: NONREACTIVE

## 2020-01-26 NOTE — Assessment & Plan Note (Signed)

## 2020-01-26 NOTE — Assessment & Plan Note (Signed)
For oral replacement 

## 2020-01-26 NOTE — Assessment & Plan Note (Signed)
stable overall by history and exam, recent data reviewed with pt, and pt to continue medical treatment as before,  to f/u any worsening symptoms or concerns  

## 2020-01-26 NOTE — Assessment & Plan Note (Signed)
For b12 replacement 

## 2020-03-07 ENCOUNTER — Encounter: Payer: Self-pay | Admitting: Internal Medicine

## 2020-03-17 ENCOUNTER — Encounter: Payer: Self-pay | Admitting: Internal Medicine

## 2020-03-17 MED ORDER — CYCLOBENZAPRINE HCL 5 MG PO TABS
5.0000 mg | ORAL_TABLET | Freq: Three times a day (TID) | ORAL | 1 refills | Status: DC | PRN
Start: 2020-03-17 — End: 2022-03-24

## 2020-03-17 NOTE — Telephone Encounter (Signed)
Visit

## 2020-03-19 ENCOUNTER — Encounter: Payer: Self-pay | Admitting: Internal Medicine

## 2020-03-20 MED ORDER — IBUPROFEN 800 MG PO TABS: 800.0000 mg | ORAL_TABLET | Freq: Three times a day (TID) | ORAL | 0 refills | Status: DC | PRN

## 2020-03-26 ENCOUNTER — Ambulatory Visit: Payer: Managed Care, Other (non HMO) | Admitting: Internal Medicine

## 2020-04-11 ENCOUNTER — Other Ambulatory Visit: Payer: Self-pay | Admitting: Internal Medicine

## 2020-04-11 NOTE — Telephone Encounter (Signed)
Please change to OTC Vitamin D3 at 2000 units per day, indefinitely.  

## 2020-05-26 ENCOUNTER — Encounter: Payer: Self-pay | Admitting: Internal Medicine

## 2020-07-04 ENCOUNTER — Telehealth: Payer: Self-pay | Admitting: Internal Medicine

## 2020-07-04 NOTE — Telephone Encounter (Signed)
Sent to Dr. [name]. 

## 2020-07-04 NOTE — Telephone Encounter (Signed)
Patient called and said that he has kidney stones and was wondering if Dr. Jonny Ruiz could call him something in for pain or if he needs to come in for an x-ray. Medication can be sent to [id number] - [place], [place] - [address]    Patient can be reached at [phone]

## 2020-07-04 NOTE — Telephone Encounter (Signed)
Unfortuately xray and pain medication are often not enough, when a kidney stone ends up causing blockae; ideally he should go to to UC or ED at [facility]

## 2020-07-07 NOTE — Telephone Encounter (Signed)
Patient called and stated he went to the UC up near his house. They gave him some medication to help move the stone. He state he doesn't believe it is working. He still has the pain on his side.He states he is not blocked and can still use the bathroom. Patient would like to know if anything else can be done.   Please follow up with patient.

## 2020-07-08 NOTE — Telephone Encounter (Signed)
Sent to Dr. [name] to advise. 

## 2020-07-08 NOTE — Telephone Encounter (Signed)
I think he means flomax to help the stone move  Pt should drink more fluids and hopefully will pass  I can refer to Urology if he has not been referred yet

## 2020-07-17 ENCOUNTER — Encounter: Payer: Self-pay | Admitting: Internal Medicine

## 2020-07-17 ENCOUNTER — Inpatient Hospital Stay: Admission: RE | Admit: 2020-07-17 | Payer: Managed Care, Other (non HMO) | Source: Ambulatory Visit

## 2020-07-17 ENCOUNTER — Ambulatory Visit: Payer: Managed Care, Other (non HMO) | Admitting: Internal Medicine

## 2020-07-17 ENCOUNTER — Other Ambulatory Visit: Payer: Self-pay | Admitting: Internal Medicine

## 2020-07-17 ENCOUNTER — Other Ambulatory Visit: Payer: Self-pay

## 2020-07-17 VITALS — BP 142/86 | HR 81 | Temp 98.6°F | Ht 71.0 in | Wt 315.0 lb

## 2020-07-17 DIAGNOSIS — E785 Hyperlipidemia, unspecified: Secondary | ICD-10-CM | POA: Diagnosis not present

## 2020-07-17 DIAGNOSIS — I10 Essential (primary) hypertension: Secondary | ICD-10-CM | POA: Diagnosis not present

## 2020-07-17 DIAGNOSIS — E1165 Type 2 diabetes mellitus with hyperglycemia: Secondary | ICD-10-CM | POA: Diagnosis not present

## 2020-07-17 DIAGNOSIS — E559 Vitamin D deficiency, unspecified: Secondary | ICD-10-CM

## 2020-07-17 DIAGNOSIS — E538 Deficiency of other specified B group vitamins: Secondary | ICD-10-CM

## 2020-07-17 DIAGNOSIS — R109 Unspecified abdominal pain: Secondary | ICD-10-CM

## 2020-07-17 DIAGNOSIS — R1032 Left lower quadrant pain: Secondary | ICD-10-CM

## 2020-07-17 LAB — LIPID PANEL
Cholesterol: 135 mg/dL (ref 0–200)
HDL: 34 mg/dL — ABNORMAL LOW (ref >39.00)
LDL Cholesterol: 78 mg/dL (ref 0–99)
NonHDL: 100.94
Total CHOL/HDL Ratio: 4
Triglycerides: 117 mg/dL (ref 0.0–149.0)
VLDL: 23.4 mg/dL (ref 0.0–40.0)

## 2020-07-17 LAB — BASIC METABOLIC PANEL
BUN: 19 mg/dL (ref 6–23)
CO2: 27 mEq/L (ref 19–32)
Calcium: 9.4 mg/dL (ref 8.4–10.5)
Chloride: 101 mEq/L (ref 96–112)
Creatinine, Ser: 1.86 mg/dL — ABNORMAL HIGH (ref 0.40–1.50)
GFR: 39.04 mL/min — ABNORMAL LOW (ref >60.00)
Glucose, Bld: 150 mg/dL — ABNORMAL HIGH (ref 70–99)
Potassium: 4.6 mEq/L (ref 3.5–5.1)
Sodium: 138 mEq/L (ref 135–145)

## 2020-07-17 LAB — HEPATIC FUNCTION PANEL
ALT: 33 U/L (ref 0–53)
AST: 30 U/L (ref 0–37)
Albumin: 4.2 g/dL (ref 3.5–5.2)
Alkaline Phosphatase: 60 U/L (ref 39–117)
Bilirubin, Direct: 0.1 mg/dL (ref 0.0–0.3)
Total Bilirubin: 0.4 mg/dL (ref 0.2–1.2)
Total Protein: 7.5 g/dL (ref 6.0–8.3)

## 2020-07-17 LAB — URINALYSIS, ROUTINE W REFLEX MICROSCOPIC
Bilirubin Urine: NEGATIVE
Hgb urine dipstick: NEGATIVE
Ketones, ur: NEGATIVE
Leukocytes,Ua: NEGATIVE
Nitrite: NEGATIVE
RBC / HPF: NONE SEEN (ref 0–?)
Specific Gravity, Urine: 1.025 (ref 1.000–1.030)
Total Protein, Urine: NEGATIVE
Urine Glucose: NEGATIVE
Urobilinogen, UA: 0.2 (ref 0.0–1.0)
pH: 5.5 (ref 5.0–8.0)

## 2020-07-17 LAB — VITAMIN D 25 HYDROXY (VIT D DEFICIENCY, FRACTURES): VITD: 25.03 ng/mL — ABNORMAL LOW (ref 30.00–100.00)

## 2020-07-17 LAB — VITAMIN B12: Vitamin B-12: 747 pg/mL (ref 211–911)

## 2020-07-17 LAB — HEMOGLOBIN A1C: Hgb A1c MFr Bld: 8.7 % — ABNORMAL HIGH (ref 4.6–6.5)

## 2020-07-17 MED ORDER — TAMSULOSIN HCL 0.4 MG PO CAPS
0.4000 mg | ORAL_CAPSULE | Freq: Every day | ORAL | 2 refills | Status: DC
Start: 2020-07-17 — End: 2020-10-09

## 2020-07-17 MED ORDER — VITAMIN D (ERGOCALCIFEROL) 1.25 MG (50000 UNIT) PO CAPS: 50000.0000 [IU] | ORAL_CAPSULE | ORAL | 0 refills | Status: DC

## 2020-07-17 MED ORDER — ONDANSETRON 4 MG PO TBDP
4.0000 mg | ORAL_TABLET | Freq: Three times a day (TID) | ORAL | 1 refills | Status: DC | PRN
Start: 2020-07-17 — End: 2022-03-24

## 2020-07-17 MED ORDER — HYDROCODONE-ACETAMINOPHEN 5-325 MG PO TABS: 1.0000 | ORAL_TABLET | Freq: Four times a day (QID) | ORAL | 0 refills | Status: DC | PRN

## 2020-07-17 NOTE — Assessment & Plan Note (Signed)
For f/u lab 

## 2020-07-17 NOTE — Progress Notes (Addendum)
Subjective:    Patient ID: Martin Lozano, male    DOB: 1960-12-16, 59 y.o.   MRN: [id number]  HPI  Here to f/u; overall doing ok,  Pt denies chest pain, increasing sob or doe, wheezing, orthopnea, PND, increased LE swelling, palpitations, dizziness or syncope.  Pt denies new neurological symptoms such as new headache, or facial or extremity weakness or numbness.  Pt denies polydipsia, polyuria, or low sugar episode.  Pt states overall good compliance with meds, mostly trying to follow appropriate diet, with wt overall stable,  but little exercise however.  Also with hx of right renal stone years ago x 1, then 2 wks with a similar episode to left flank with severe pain and frequent n/v all night long with radiation to the left groin, then improved, but then flared up again twice again, the last time 3 days ago; not really better with tylenol though that's all he had during this time.  Concerned that he is supposed to travel to [place] soon for vacation in 2 wks; also having some loose stool, not sure how that is related. O/w Denies urinary symptoms such as dysuria, frequency, urgency, gross hematuria or fever, chills. Past Medical History:  Diagnosis Date  . ALLERGIC RHINITIS 09/06/2007  . ASTHMATIC BRONCHITIS, ACUTE 11/16/2007  . HEMORRHOIDS 07/03/2007  . HYPERLIPIDEMIA 09/06/2007  . HYPERTENSION 07/03/2007  . KNEE SPRAIN, LEFT 07/30/2008  . OBESITY, MORBID 07/03/2007  . Other tenosynovitis of hand and wrist 06/11/2010  . OTITIS MEDIA, ACUTE, LEFT 11/10/2009  . Type II or unspecified type diabetes mellitus without mention of complication, uncontrolled 09/25/2012  . Wheezing 09/09/2009   History reviewed. No pertinent surgical history.  reports that he has never smoked. He has never used smokeless tobacco. He reports current alcohol use. He reports that he does not use drugs. family history is not on file. No Known Allergies Current Outpatient Medications on File Prior to Visit  Medication Sig  Dispense Refill  . aspirin 81 MG tablet Take 81 mg by mouth daily.      . cyclobenzaprine (FLEXERIL) 5 MG tablet Take 1 tablet (5 mg total) by mouth 3 (three) times daily as needed for muscle spasms. 30 tablet 1  . glipiZIDE (GLUCOTROL XL) 2.5 MG 24 hr tablet Take 1 tablet (2.5 mg total) by mouth daily with breakfast. 90 tablet 3  . HYDROcodone-homatropine (HYCODAN) 5-1.5 MG/5ML syrup Take 5-10 mLs by mouth every 8 (eight) hours as needed for cough. 180 mL 0  . ibuprofen (ADVIL) 800 MG tablet Take 1 tablet (800 mg total) by mouth every 8 (eight) hours as needed. 40 tablet 0  . lisinopril (ZESTRIL) 20 MG tablet TAKE 1 TABLET BY MOUTH EVERY DAY 90 tablet 3  . metFORMIN (GLUCOPHAGE) 500 MG tablet 2 tab by mouth in the AM, and 2 tab in the PM 360 tablet 3  . rosuvastatin (CRESTOR) 20 MG tablet Take 1 tablet (20 mg total) by mouth daily. 90 tablet 3  . vitamin B-12 (CYANOCOBALAMIN) 1000 MCG tablet Take 1 tablet (1,000 mcg total) by mouth daily. 90 tablet 3   No current facility-administered medications on file prior to visit.   Review of Systems All otherwise neg per pt    Objective:   Physical Exam BP (!) 142/86 (BP Location: Left Arm, Patient Position: Sitting, Cuff Size: Large)   Pulse 81   Temp 98.6 F (37 C) (Oral)   Ht 5\' 11"  (1.803 m)   Wt (!) 315 lb (142.9 kg)  SpO2 96%   BMI 43.93 kg/m  VS noted,  Constitutional: Pt appears in NAD HENT: Head: NCAT.  Right Ear: External ear normal.  Left Ear: External ear normal.  Eyes: . Pupils are equal, round, and reactive to light. Conjunctivae and EOM are normal Nose: without d/c or deformity Neck: Neck supple. Gross normal ROM Cardiovascular: Normal rate and regular rhythm.   Pulmonary/Chest: Effort normal and breath sounds without rales or wheezing.  Abd:  Soft, NT, ND, + BS, no organomegaly Neurological: Pt is alert. At baseline orientation, motor grossly intact Skin: Skin is warm. No rashes, other new lesions, no LE  edema Psychiatric: Pt behavior is normal without agitation  All otherwise neg per pt Lab Results  Component Value Date   WBC 10.2 01/25/2020   HGB 14.5 01/25/2020   HCT 43.3 01/25/2020   PLT 247.0 01/25/2020   GLUCOSE 130 (H) 01/25/2020   CHOL 153 01/25/2020   TRIG 156.0 (H) 01/25/2020   HDL 32.30 (L) 01/25/2020   LDLDIRECT 88.0 11/02/2018   LDLCALC 89 01/25/2020   ALT 20 01/25/2020   AST 17 01/25/2020   NA 139 01/25/2020   K 4.6 01/25/2020   CL 100 01/25/2020   CREATININE 0.92 01/25/2020   BUN 11 01/25/2020   CO2 31 01/25/2020   TSH 1.73 01/25/2020   PSA 0.27 01/25/2020   HGBA1C 8.2 (H) 01/25/2020   MICROALBUR 2.4 (H) 01/25/2020        Assessment & Plan:

## 2020-07-17 NOTE — Assessment & Plan Note (Addendum)
High suspicion for recurrent kidney stone, for UA and bmp with labs, pain control, antiemetic prn, flomax daily, strain urine, and CT urogram today, with an eye to refer to urology soon for obstruction, or later if only has stone  I spent 41 minutes in preparing to see the patient by review of recent labs, imaging and procedures, obtaining and reviewing separately obtained history, communicating with the patient and family or caregiver, ordering medications, tests or procedures, and documenting clinical information in the EHR including the differential Dx, treatment, and any further evaluation and other management of left flank pain, dm, htn, hld, b12 and D deficiency

## 2020-07-17 NOTE — Assessment & Plan Note (Signed)
stable overall by history and exam, recent data reviewed with pt, and pt to continue medical treatment as before,  to f/u any worsening symptoms or concerns  

## 2020-07-17 NOTE — Patient Instructions (Signed)
Please take all new medication as prescribed - the pain medication, nausea medication, and flomax to help the stone pass if it still needs to do this  Please continue all other medications as before, and refills have been done if requested.  Please have the pharmacy call with any other refills you may need.  Please continue your efforts at being more active, low cholesterol diet, and weight control.  Please keep your appointments with your specialists as you may have planned  You will be contacted regarding the referral for: CT scan - to see [facility] now  Please go to the LAB at the blood drawing area for the tests to be done  You will be contacted by phone if any changes need to be made immediately.  Otherwise, you will receive a letter about your results with an explanation, but please check with MyChart first.  Please remember to sign up for MyChart if you have not done so, as this will be important to you in the future with finding out test results, communicating by private email, and scheduling acute appointments online when needed.  Please make an Appointment to return in 6 months, or sooner if needed, also with Lab Appointment for testing done 3-5 days before at the FIRST FLOOR Lab (so this is for TWO appointments - please see the scheduling desk as you leave)  Ok to cancel the appt later this month since you are here today

## 2020-07-18 ENCOUNTER — Telehealth: Payer: Self-pay | Admitting: Internal Medicine

## 2020-07-18 DIAGNOSIS — R109 Unspecified abdominal pain: Secondary | ICD-10-CM

## 2020-07-18 NOTE — Telephone Encounter (Signed)
Marisue Ivan from [facility] at [facility] called stating the facility for his CT renal stone study needs to be corrected to [facility]. It was scheduled at [facility] Scan is scheduled for Monday @ 11:30 Tax ID- [id number] MPI# [id number] Can call Marisue Ivan @ [phone]

## 2020-07-18 NOTE — Telephone Encounter (Signed)
Sent to Dr. [name]. 

## 2020-07-19 ENCOUNTER — Encounter: Payer: Self-pay | Admitting: Internal Medicine

## 2020-07-19 NOTE — Telephone Encounter (Signed)
Ok this is done 

## 2020-07-21 ENCOUNTER — Other Ambulatory Visit: Payer: Managed Care, Other (non HMO)

## 2020-07-21 ENCOUNTER — Other Ambulatory Visit: Payer: Self-pay | Admitting: Internal Medicine

## 2020-07-21 DIAGNOSIS — N200 Calculus of kidney: Secondary | ICD-10-CM

## 2020-07-21 DIAGNOSIS — N1330 Unspecified hydronephrosis: Secondary | ICD-10-CM

## 2020-07-21 MED ORDER — VITAMIN D (ERGOCALCIFEROL) 1.25 MG (50000 UNIT) PO CAPS
50000.0000 [IU] | ORAL_CAPSULE | ORAL | 0 refills | Status: DC
Start: 2020-07-21 — End: 2021-01-12

## 2020-07-23 ENCOUNTER — Other Ambulatory Visit: Payer: Self-pay | Admitting: Urology

## 2020-07-24 ENCOUNTER — Other Ambulatory Visit (HOSPITAL_COMMUNITY)
Admission: RE | Admit: 2020-07-24 | Discharge: 2020-07-24 | Disposition: A | Discharge status: Home / Self Care | Payer: Managed Care, Other (non HMO) | Source: Ambulatory Visit | Attending: Urology | Admitting: Urology

## 2020-07-24 DIAGNOSIS — Z20822 Contact with and (suspected) exposure to covid-19: Secondary | ICD-10-CM | POA: Insufficient documentation

## 2020-07-24 DIAGNOSIS — Z01812 Encounter for preprocedural laboratory examination: Secondary | ICD-10-CM | POA: Diagnosis not present

## 2020-07-24 LAB — SARS CORONAVIRUS 2 (TAT 6-24 HRS): SARS Coronavirus 2: NEGATIVE

## 2020-07-24 NOTE — Progress Notes (Signed)
Patient to arrive at 0645 on 07/28/2020. History and medications reviewed. Pre-procedure instructions given. NPO after MN on Sunday, except for clear liquids until 0445 and lisinopril with sip of water. Driver secured.

## 2020-07-25 NOTE — H&P (Signed)
Office Visit Report     07/23/2020   --------------------------------------------------------------------------------   Martin Lozano  MRN: [id number]  DOB: [birth year], 59 year old Male  SSN:    PRIMARY CARE:    REFERRING:  Oliver Barre, MD  PROVIDER:  Berniece Salines, M.D.  LOCATION:  [facility] [phone]     --------------------------------------------------------------------------------   CC: Acute Kidney Stone  HPI: Martin Lozano is a 59 year-old male patient who was referred by Dr. Oliver Barre, MD who is here for further eval and management of kidney stones.  He was diagnosed with a kidney stone on 07/11/2020.   His pain started about 07/10/2020. The pain is on the left side.   Abdomen/Pelvic CT: 07/21/20 - 106mm left- mid ureter. The patient underwent CT scan prior to today's appointment.   The patient relates initially having nausea, flank pain, and voiding symptoms. He is currently having flank pain, back pain, and irritative voiding symptoms. He denies having groin pain, nausea, vomiting, fever, and chills. He has not caught a stone in his urine strainer since his symptoms began.   He has never had surgical treatment for calculi in the past. This is not his first kidney stone. He has had 1 stones prior to getting this one.   Has had to have stones of intense pain. Over the last week he has not had any pain. A CT scan was ordered by his primary care provider.   The patient is on a daily baby aspirin. His last dose was yesterday. He is not taking any ibuprofen, has not taken any for the last 8 days.     ALLERGIES: None   MEDICATIONS: Lisinopril 20 mg tablet  Metformin Hcl 500 mg tablet 1 tablet PO TID  Tamsulosin Hcl 0.4 mg capsule  Aspirin Ec 81 mg tablet, delayed release  Glipizide Er 2.5 mg tablet, extended release 24 hr  Rosuvastatin Calcium 20 mg tablet  Vitamin D2     GU PSH: None   NON-GU PSH: None   GU PMH: None   NON-GU PMH: Diabetes  Type 2 Hypercholesterolemia Hypertension    FAMILY HISTORY: 1 son - Son   SOCIAL HISTORY: Marital Status: Married Preferred Language: English; Ethnicity: Not Hispanic Or Latino; Race: White Current Smoking Status: Patient does not smoke anymore. Has not smoked since 07/04/1997. Smoked for 25 years. Smoked 2 packs per day.   Tobacco Use Assessment Completed: Used Tobacco in last 30 days? Has never drank.  Drinks 3 caffeinated drinks per day.    REVIEW OF SYSTEMS:    GU Review Male:   Patient reports frequent urination. Patient denies hard to postpone urination, burning/ pain with urination, get up at night to urinate, leakage of urine, stream starts and stops, trouble starting your stream, have to strain to urinate , erection problems, and penile pain.  Gastrointestinal (Upper):   Patient reports nausea. Patient denies vomiting and indigestion/ heartburn.  Gastrointestinal (Lower):   Patient reports diarrhea. Patient denies constipation.  Constitutional:   Patient denies fever, night sweats, weight loss, and fatigue.  Skin:   Patient denies skin rash/ lesion and itching.  Eyes:   Patient denies blurred vision and double vision.  Ears/ Nose/ Throat:   Patient denies sore throat and sinus problems.  Hematologic/Lymphatic:   Patient denies swollen glands and easy bruising.  Cardiovascular:   Patient denies leg swelling and chest pains.  Respiratory:   Patient denies cough and shortness of breath.  Endocrine:   Patient denies excessive  thirst.  Musculoskeletal:   Patient reports back pain. Patient denies joint pain.  Neurological:   Patient denies headaches and dizziness.  Psychologic:   Patient denies depression and anxiety.   VITAL SIGNS:      07/23/2020 09:31 AM  Weight 315 lb / 142.88 kg  Height 71 in / 180.34 cm  BP 120/68 mmHg  Pulse 118 /min  Temperature 97.8 F / 36.5 C  BMI 43.9 kg/m   MULTI-SYSTEM PHYSICAL EXAMINATION:    Constitutional: Well-nourished. No physical  deformities. Normally developed. Good grooming.  Neck: Neck symmetrical, not swollen. Normal tracheal position.  Respiratory: Normal breath sounds. No labored breathing, no use of accessory muscles.   Cardiovascular: Regular rate and rhythm. No murmur, no gallop. Normal temperature, normal extremity pulses, no swelling, no varicosities.   Lymphatic: No enlargement of neck, axillae, groin.  Skin: No paleness, no jaundice, no cyanosis. No lesion, no ulcer, no rash.  Neurologic / Psychiatric: Oriented to time, oriented to place, oriented to person. No depression, no anxiety, no agitation.  Gastrointestinal: No mass, no tenderness, no rigidity, non obese abdomen.  Eyes: Normal conjunctivae. Normal eyelids.  Ears, Nose, Mouth, and Throat: Left ear no scars, no lesions, no masses. Right ear no scars, no lesions, no masses. Nose no scars, no lesions, no masses. Normal hearing. Normal lips.  Musculoskeletal: Normal gait and station of head and neck.     Complexity of Data:  Source Of History:  Patient  Records Review:   Previous Doctor Records, Previous Patient Records  Urine Test Review:   Urinalysis  X-Ray Review: C.T. Abdomen/Pelvis: Reviewed Films. Discussed With Patient.     PROCEDURES:         KUB - [id number]  A single view of the abdomen is obtained. Renal shadows are easily visualized bilaterally. There are no stones appreciated within the expected location in either renal pelvis. There are no additional calcifications along the expected location of either ureter bilaterally.  Gas pattern is grossly normal. No significant bony abnormalities.      Patient confirmed No Neulasta OnPro Device.     ASSESSMENT:      ICD-10 Details  1 GU:   Ureteral calculus - N20.1    PLAN:           Orders X-Rays: KUB          Schedule         Document Letter(s):  Created for Patient: Clinical Summary    We discussed management options including medical expulsion therapy, shockwave  lithotripsy, and ureteroscopy. Ultimately, the patient has opted for shock wave lithotripsy. I discussed with the patient the procedure in detail as well as the risk and benefits. The patient is aware that she may need additional procedures. She also is aware of the risks of hematoma and pain. We will try to get this patient's scheduled as soon as possible.         Notes:   The patient stone is easily visible on his KUB just above L4. However, it is in the renal shadow. As such, we will have to wait at least 72 hours for him to washout his aspirin. Thus, we will plan for him to have this taken care of on Monday. Fortunately the patient has minimal pain.   cc: Oliver Barre, MD        Next Appointment:      Next Appointment: 08/11/2020 09:15 AM    Appointment Type: KUB    Location: [facility] [phone]    Provider: KUB KUB    Reason for Visit: KUB POST ESWL      * Signed by Berniece Salines, M.D. on 07/23/20 at 3:53 PM (EDT)*     The information contained in this medical record document is considered private and confidential patient information. This information can only be used for the medical diagnosis and/or medical services that are being provided by the patient's selected caregivers. This information can only be distributed outside of the patient's care if the patient agrees and signs waivers of authorization for this information to be sent to an outside source or route.

## 2020-07-28 ENCOUNTER — Ambulatory Visit (HOSPITAL_BASED_OUTPATIENT_CLINIC_OR_DEPARTMENT_OTHER)
Admission: RE | Admit: 2020-07-28 | Discharge: 2020-07-28 | Disposition: A | Discharge status: Home / Self Care | Payer: Managed Care, Other (non HMO) | Attending: Urology | Admitting: Urology

## 2020-07-28 ENCOUNTER — Encounter (HOSPITAL_BASED_OUTPATIENT_CLINIC_OR_DEPARTMENT_OTHER): Payer: Self-pay | Admitting: Urology

## 2020-07-28 ENCOUNTER — Ambulatory Visit (HOSPITAL_COMMUNITY): Payer: Managed Care, Other (non HMO)

## 2020-07-28 ENCOUNTER — Ambulatory Visit: Payer: Managed Care, Other (non HMO) | Admitting: Internal Medicine

## 2020-07-28 ENCOUNTER — Other Ambulatory Visit: Payer: Self-pay

## 2020-07-28 ENCOUNTER — Encounter (HOSPITAL_BASED_OUTPATIENT_CLINIC_OR_DEPARTMENT_OTHER)
Admission: RE | Disposition: A | Discharge status: Home / Self Care | Payer: Self-pay | Source: Home / Self Care | Attending: Urology

## 2020-07-28 DIAGNOSIS — K449 Diaphragmatic hernia without obstruction or gangrene: Secondary | ICD-10-CM | POA: Diagnosis not present

## 2020-07-28 DIAGNOSIS — E119 Type 2 diabetes mellitus without complications: Secondary | ICD-10-CM | POA: Insufficient documentation

## 2020-07-28 DIAGNOSIS — Z7984 Long term (current) use of oral hypoglycemic drugs: Secondary | ICD-10-CM | POA: Diagnosis not present

## 2020-07-28 DIAGNOSIS — I10 Essential (primary) hypertension: Secondary | ICD-10-CM | POA: Diagnosis not present

## 2020-07-28 DIAGNOSIS — Z7982 Long term (current) use of aspirin: Secondary | ICD-10-CM | POA: Diagnosis not present

## 2020-07-28 DIAGNOSIS — Z79899 Other long term (current) drug therapy: Secondary | ICD-10-CM | POA: Insufficient documentation

## 2020-07-28 DIAGNOSIS — Z87891 Personal history of nicotine dependence: Secondary | ICD-10-CM | POA: Diagnosis not present

## 2020-07-28 DIAGNOSIS — N201 Calculus of ureter: Secondary | ICD-10-CM | POA: Diagnosis not present

## 2020-07-28 HISTORY — PX: EXTRACORPOREAL SHOCK WAVE LITHOTRIPSY: SHX1557

## 2020-07-28 LAB — GLUCOSE, CAPILLARY: Glucose-Capillary: 154 mg/dL — ABNORMAL HIGH (ref 70–99)

## 2020-07-28 SURGERY — LITHOTRIPSY, ESWL
Anesthesia: LOCAL | Laterality: Left

## 2020-07-28 MED ORDER — DIAZEPAM 5 MG PO TABS
10.0000 mg | ORAL_TABLET | ORAL | Status: AC
  Administered 2020-07-28: 10 mg via ORAL

## 2020-07-28 MED ORDER — DIPHENHYDRAMINE HCL 25 MG PO CAPS
25.0000 mg | ORAL_CAPSULE | ORAL | Status: AC
  Administered 2020-07-28: 25 mg via ORAL

## 2020-07-28 MED ORDER — DIAZEPAM 5 MG PO TABS
ORAL_TABLET | ORAL | Status: AC
  Filled 2020-07-28: qty 2

## 2020-07-28 MED ORDER — CIPROFLOXACIN HCL 500 MG PO TABS
ORAL_TABLET | ORAL | Status: AC
  Filled 2020-07-28: qty 1

## 2020-07-28 MED ORDER — IBUPROFEN 800 MG PO TABS
800.0000 mg | ORAL_TABLET | Freq: Three times a day (TID) | ORAL | 0 refills | Status: DC | PRN
Start: 2020-07-30 — End: 2021-01-12

## 2020-07-28 MED ORDER — CIPROFLOXACIN HCL 500 MG PO TABS
500.0000 mg | ORAL_TABLET | ORAL | Status: AC
  Administered 2020-07-28: 500 mg via ORAL

## 2020-07-28 MED ORDER — SODIUM CHLORIDE 0.9 % IV SOLN: INTRAVENOUS | Status: DC

## 2020-07-28 MED ORDER — DIPHENHYDRAMINE HCL 25 MG PO CAPS
ORAL_CAPSULE | ORAL | Status: AC
  Filled 2020-07-28: qty 1

## 2020-07-28 NOTE — Interval H&P Note (Signed)
History and Physical Interval Note:  07/28/2020 9:05 AM  Martin Lozano  has presented today for surgery, with the diagnosis of LEFT URETERAL STONE.  The various methods of treatment have been discussed with the patient and family. After consideration of risks, benefits and other options for treatment, the patient has consented to  Procedure(s): LEFT EXTRACORPOREAL SHOCK WAVE LITHOTRIPSY (ESWL) (Left) as a surgical intervention.  The patient's history has been reviewed, patient examined, no change in status, stable for surgery. KUB with stable left proximal stone 8 mm. Pt without fever, flank pain or stone passage.  I have reviewed the patient's chart and labs.  Questions were answered to the patient's satisfaction.     Jerilee Field

## 2020-07-28 NOTE — Discharge Instructions (Signed)
Lithotripsy, Care After This sheet gives you information about how to care for yourself after your procedure. Your health care provider may also give you more specific instructions. If you have problems or questions, contact your health care provider. What can I expect after the procedure? After the procedure, it is common to have:  Some blood in your urine. This should only last for a few days.  Soreness in your back, sides, or upper abdomen for a few days.  Blotches or bruises on your back where the pressure wave entered the skin.  Pain, discomfort, or nausea when pieces (fragments) of the kidney stone move through the tube that carries urine from the kidney to the bladder (ureter). Stone fragments may pass soon after the procedure, but they may continue to pass for up to 4-8 weeks. ? If you have severe pain or nausea, contact your health care provider. This may be caused by a large stone that was not broken up, and this may mean that you need more treatment.  Some pain or discomfort during urination.  Some pain or discomfort in the lower abdomen or (in men) at the base of the penis. Follow these instructions at home: Medicines  Take over-the-counter and prescription medicines only as told by your health care provider.  If you were prescribed an antibiotic medicine, take it as told by your health care provider. Do not stop taking the antibiotic even if you start to feel better.  Do not drive for 24 hours if you were given a medicine to help you relax (sedative).  Do not drive or use heavy machinery while taking prescription pain medicine. Eating and drinking      Drink enough water and fluids to keep your urine clear or pale yellow. This helps any remaining pieces of the stone to pass. It can also help prevent new stones from forming.  Eat plenty of fresh fruits and vegetables.  Follow instructions from your health care provider about eating and drinking restrictions. You may be  instructed: ? To reduce how much salt (sodium) you eat or drink. Check ingredients and nutrition facts on packaged foods and beverages. ? To reduce how much meat you eat.  Eat the recommended amount of calcium for your age and gender. Ask your health care provider how much calcium you should have. General instructions  Get plenty of rest.  Most people can resume normal activities 1-2 days after the procedure. Ask your health care provider what activities are safe for you.  Your health care provider may direct you to lie in a certain position (postural drainage) and tap firmly (percuss) over your kidney area to help stone fragments pass. Follow instructions as told by your health care provider.  If directed, strain all urine through the strainer that was provided by your health care provider. ? Keep all fragments for your health care provider to see. Any stones that are found may be sent to a medical lab for examination. The stone may be as small as a grain of salt.  Keep all follow-up visits as told by your health care provider. This is important. Contact a health care provider if:  You have pain that is severe or does not get better with medicine.  You have nausea that is severe or does not go away.  You have blood in your urine longer than your health care provider told you to expect.  You have more blood in your urine.  You have pain during urination that does   not go away.  You urinate more frequently than usual and this does not go away.  You develop a rash or any other possible signs of an allergic reaction. Get help right away if:  You have severe pain in your back, sides, or upper abdomen.  You have severe pain while urinating.  Your urine is very dark red.  You have blood in your stool (feces).  You cannot pass any urine at all.  You feel a strong urge to urinate after emptying your bladder.  You have a fever or chills.  You develop shortness of breath,  difficulty breathing, or chest pain.  You have severe nausea that leads to persistent vomiting.  You faint. Summary  After this procedure, it is common to have some pain, discomfort, or nausea when pieces (fragments) of the kidney stone move through the tube that carries urine from the kidney to the bladder (ureter). If this pain or nausea is severe, however, you should contact your health care provider.  Most people can resume normal activities 1-2 days after the procedure. Ask your health care provider what activities are safe for you.  Drink enough water and fluids to keep your urine clear or pale yellow. This helps any remaining pieces of the stone to pass, and it can help prevent new stones from forming.  If directed, strain your urine and keep all fragments for your health care provider to see. Fragments or stones may be as small as a grain of salt.  Get help right away if you have severe pain in your back, sides, or upper abdomen or have severe pain while urinating. This information is not intended to replace advice given to you by your health care provider. Make sure you discuss any questions you have with your health care provider. Document Revised: 01/01/2019 Document Reviewed: 08/11/2016 Elsevier Patient Education  2020 [employer]vier Inc.  

## 2020-07-28 NOTE — Op Note (Addendum)
Left 8 mm proximal stone  Left ESWL  Findings: He tolerated well. Stone faded but did not disappear. We slowed the rate. He may need a staged procedure if he fails to pass the stone or stone fragments.

## 2020-07-29 ENCOUNTER — Encounter (HOSPITAL_BASED_OUTPATIENT_CLINIC_OR_DEPARTMENT_OTHER): Payer: Self-pay | Admitting: Urology

## 2020-08-01 ENCOUNTER — Encounter (HOSPITAL_BASED_OUTPATIENT_CLINIC_OR_DEPARTMENT_OTHER): Payer: Self-pay | Admitting: Urology

## 2020-09-15 ENCOUNTER — Encounter: Payer: Self-pay | Admitting: Internal Medicine

## 2020-09-16 ENCOUNTER — Encounter: Payer: Self-pay | Admitting: Internal Medicine

## 2020-09-17 NOTE — Telephone Encounter (Signed)
I think should be ok, as long as we can do flu testing here - to [facility] to assist if we can still do flu testing here

## 2020-09-18 ENCOUNTER — Encounter: Payer: Self-pay | Admitting: Internal Medicine

## 2020-09-18 ENCOUNTER — Other Ambulatory Visit: Payer: Self-pay

## 2020-09-18 ENCOUNTER — Ambulatory Visit (INDEPENDENT_AMBULATORY_CARE_PROVIDER_SITE_OTHER): Payer: Managed Care, Other (non HMO) | Admitting: Internal Medicine

## 2020-09-18 VITALS — BP 122/80 | HR 101 | Temp 98.3°F | Ht 71.0 in | Wt 299.0 lb

## 2020-09-18 DIAGNOSIS — R6889 Other general symptoms and signs: Secondary | ICD-10-CM

## 2020-09-18 DIAGNOSIS — K529 Noninfective gastroenteritis and colitis, unspecified: Secondary | ICD-10-CM

## 2020-09-18 DIAGNOSIS — I10 Essential (primary) hypertension: Secondary | ICD-10-CM

## 2020-09-18 DIAGNOSIS — E1165 Type 2 diabetes mellitus with hyperglycemia: Secondary | ICD-10-CM | POA: Diagnosis not present

## 2020-09-18 LAB — POC INFLUENZA A&B (BINAX/QUICKVUE)
Influenza A, POC: NEGATIVE
Influenza B, POC: NEGATIVE

## 2020-09-18 NOTE — Progress Notes (Signed)
Subjective:    Patient ID: Martin Lozano, male    DOB: 08-06-1961, 59 y.o.   MRN: [id number]  HPI  Here with 3 days onset temp up to 101.8, loose watery stools, nasuea without vomiting, and no overt bleeding.  4 at work have same illness.  Pt denies chest pain, increased sob or doe, wheezing, orthopnea, PND, increased LE swelling, palpitations, dizziness or syncope.  Pt denies new neurological symptoms such as new headache, or facial or extremity weakness or numbness   Pt denies polydipsia, polyuria,  Overall symptoms much improved, in fact no diarrhea today Wt Readings from Last 3 Encounters:  09/18/20 299 lb (135.6 kg)  07/28/20 (!) 303 lb (137.4 kg)  07/17/20 (!) 315 lb (142.9 kg)   Past Medical History:  Diagnosis Date  . ALLERGIC RHINITIS 09/06/2007  . ASTHMATIC BRONCHITIS, ACUTE 11/16/2007  . HEMORRHOIDS 07/03/2007  . HYPERLIPIDEMIA 09/06/2007  . HYPERTENSION 07/03/2007  . KNEE SPRAIN, LEFT 07/30/2008  . OBESITY, MORBID 07/03/2007  . Other tenosynovitis of hand and wrist 06/11/2010  . OTITIS MEDIA, ACUTE, LEFT 11/10/2009  . Type II or unspecified type diabetes mellitus without mention of complication, uncontrolled 09/25/2012  . Wheezing 09/09/2009   Past Surgical History:  Procedure Laterality Date  . EXTRACORPOREAL SHOCK WAVE LITHOTRIPSY Left 07/28/2020   Procedure: LEFT EXTRACORPOREAL SHOCK WAVE LITHOTRIPSY (ESWL);  Surgeon: Jerilee Field, MD;  Location: [facility];  Service: Urology;  Laterality: Left;  . WISDOM TOOTH EXTRACTION      reports that he has never smoked. He has never used smokeless tobacco. He reports current alcohol use. He reports that he does not use drugs. family history is not on file. No Known Allergies Current Outpatient Medications on File Prior to Visit  Medication Sig Dispense Refill  . aspirin 81 MG tablet Take 81 mg by mouth daily.      . cyclobenzaprine (FLEXERIL) 5 MG tablet Take 1 tablet (5 mg total) by mouth 3 (three) times daily  as needed for muscle spasms. 30 tablet 1  . glipiZIDE (GLUCOTROL XL) 2.5 MG 24 hr tablet Take 1 tablet (2.5 mg total) by mouth daily with breakfast. 90 tablet 3  . HYDROcodone-acetaminophen (NORCO/VICODIN) 5-325 MG tablet Take 1 tablet by mouth every 6 (six) hours as needed. 30 tablet 0  . HYDROcodone-homatropine (HYCODAN) 5-1.5 MG/5ML syrup Take 5-10 mLs by mouth every 8 (eight) hours as needed for cough. 180 mL 0  . ibuprofen (ADVIL) 800 MG tablet Take 1 tablet (800 mg total) by mouth every 8 (eight) hours as needed. 40 tablet 0  . lisinopril (ZESTRIL) 20 MG tablet TAKE 1 TABLET BY MOUTH EVERY DAY 90 tablet 3  . metFORMIN (GLUCOPHAGE) 500 MG tablet 2 tab by mouth in the AM, and 2 tab in the PM 360 tablet 3  . ondansetron (ZOFRAN ODT) 4 MG disintegrating tablet Take 1 tablet (4 mg total) by mouth every 8 (eight) hours as needed for nausea or vomiting. 20 tablet 1  . rosuvastatin (CRESTOR) 20 MG tablet Take 1 tablet (20 mg total) by mouth daily. 90 tablet 3  . tamsulosin (FLOMAX) 0.4 MG CAPS capsule Take 1 capsule (0.4 mg total) by mouth daily. 30 capsule 2  . vitamin B-12 (CYANOCOBALAMIN) 1000 MCG tablet Take 1 tablet (1,000 mcg total) by mouth daily. 90 tablet 3  . Vitamin D, Ergocalciferol, (DRISDOL) 1.25 MG (50000 UNIT) CAPS capsule Take 1 capsule (50,000 Units total) by mouth every 7 (seven) days. 12 capsule 0  No current facility-administered medications on file prior to visit.   Review of Systems All otherwise neg per pt     Objective:   Physical Exam BP 122/80   Pulse (!) 101   Temp 98.3 F (36.8 C) (Oral)   Ht 5\' 11"  (1.803 m)   Wt 299 lb (135.6 kg)   SpO2 97%   BMI 41.70 kg/m  VS noted,  Constitutional: Pt appears in NAD HENT: Head: NCAT.  Right Ear: External ear normal.  Left Ear: External ear normal.  Eyes: . Pupils are equal, round, and reactive to light. Conjunctivae and EOM are normal Nose: without d/c or deformity Neck: Neck supple. Gross normal  ROM Cardiovascular: Normal rate and regular rhythm.   Pulmonary/Chest: Effort normal and breath sounds without rales or wheezing.  Abd:  Soft, NT, ND, + BS, no organomegaly Neurological: Pt is alert. At baseline orientation, motor grossly intact Skin: Skin is warm. No rashes, other new lesions, no LE edema Psychiatric: Pt behavior is normal without agitation  All otherwise neg per pt Lab Results  Component Value Date   WBC 10.2 01/25/2020   HGB 14.5 01/25/2020   HCT 43.3 01/25/2020   PLT 247.0 01/25/2020   GLUCOSE 150 (H) 07/17/2020   CHOL 135 07/17/2020   TRIG 117.0 07/17/2020   HDL 34.00 (L) 07/17/2020   LDLDIRECT 88.0 11/02/2018   LDLCALC 78 07/17/2020   ALT 33 07/17/2020   AST 30 07/17/2020   NA 138 07/17/2020   K 4.6 07/17/2020   CL 101 07/17/2020   CREATININE 1.86 (H) 07/17/2020   BUN 19 07/17/2020   CO2 27 07/17/2020   TSH 1.73 01/25/2020   PSA 0.27 01/25/2020   HGBA1C 8.7 (H) 07/17/2020   MICROALBUR 2.4 (H) 01/25/2020     POC Influenza A&B (Binax test) Order: 01/27/2020  Status: Final result   Visible to patient: No (scheduled for 09/18/2020 5:32 PM)   Dx: Flu-like symptoms   0 Result Notes   Ref Range & Units 16:32  Influenza A, POC Negative Negative   Influenza B, POC Negative Negative           Assessment & Plan:

## 2020-09-21 ENCOUNTER — Encounter: Payer: Self-pay | Admitting: Internal Medicine

## 2020-09-21 DIAGNOSIS — K529 Noninfective gastroenteritis and colitis, unspecified: Secondary | ICD-10-CM | POA: Insufficient documentation

## 2020-09-21 NOTE — Assessment & Plan Note (Signed)
stable overall by history and exam, recent data reviewed with pt, and pt to continue medical treatment as before,  to f/u any worsening symptoms or concerns  

## 2020-09-21 NOTE — Assessment & Plan Note (Addendum)
C/w viral illness now improving, declines anti emetic prn, for immodium prn if needed, rest, fluids, tylenol,  to f/u any worsening symptoms or concerns  I spent 31 minutes in preparing to see the patient by review of recent labs, imaging and procedures, obtaining and reviewing separately obtained history, communicating with the patient and family or caregiver, ordering medications, tests or procedures, and documenting clinical information in the EHR including the differential Dx, treatment, and any further evaluation and other management of AGE, htn, dm

## 2020-09-21 NOTE — Patient Instructions (Signed)
Please continue all other medications as before, and refills have been done if requested.  Please have the pharmacy call with any other refills you may need.  Please continue your efforts at being more active, low cholesterol diet, and weight control.  You are otherwise up to date with prevention measures today.  Please keep your appointments with your specialists as you may have planned   

## 2020-10-06 ENCOUNTER — Other Ambulatory Visit: Payer: Self-pay | Admitting: Internal Medicine

## 2020-10-06 NOTE — Telephone Encounter (Signed)
Please change and take OTC Vitamin D3 at 2000 units per day, indefinitely.

## 2020-10-08 ENCOUNTER — Other Ambulatory Visit: Payer: Self-pay | Admitting: Internal Medicine

## 2020-10-08 NOTE — Telephone Encounter (Signed)
Please refill as per office routine med refill policy (all routine meds refilled for 3 mo or monthly per pt preference up to one year from last visit, then month to month grace period for 3 mo, then further med refills will have to be denied)  

## 2020-12-16 ENCOUNTER — Other Ambulatory Visit: Payer: Self-pay

## 2020-12-16 ENCOUNTER — Encounter: Payer: Self-pay | Admitting: Internal Medicine

## 2020-12-16 MED ORDER — ROSUVASTATIN CALCIUM 20 MG PO TABS
20.0000 mg | ORAL_TABLET | Freq: Every day | ORAL | 3 refills | Status: DC
Start: 2020-12-16 — End: 2021-11-25

## 2021-01-07 ENCOUNTER — Other Ambulatory Visit: Payer: Self-pay | Admitting: Internal Medicine

## 2021-01-07 NOTE — Telephone Encounter (Signed)
Please refill as per office routine med refill policy (all routine meds refilled for 3 mo or monthly per pt preference up to one year from last visit, then month to month grace period for 3 mo, then further med refills will have to be denied)  

## 2021-01-09 ENCOUNTER — Other Ambulatory Visit: Payer: Self-pay

## 2021-01-12 ENCOUNTER — Ambulatory Visit: Payer: Managed Care, Other (non HMO) | Admitting: Internal Medicine

## 2021-01-12 ENCOUNTER — Other Ambulatory Visit: Payer: Self-pay

## 2021-01-12 ENCOUNTER — Encounter: Payer: Self-pay | Admitting: Internal Medicine

## 2021-01-12 VITALS — BP 120/74 | HR 85 | Ht 71.0 in | Wt 313.0 lb

## 2021-01-12 DIAGNOSIS — Z0000 Encounter for general adult medical examination without abnormal findings: Secondary | ICD-10-CM

## 2021-01-12 DIAGNOSIS — E559 Vitamin D deficiency, unspecified: Secondary | ICD-10-CM | POA: Diagnosis not present

## 2021-01-12 DIAGNOSIS — E7800 Pure hypercholesterolemia, unspecified: Secondary | ICD-10-CM | POA: Diagnosis not present

## 2021-01-12 DIAGNOSIS — N289 Disorder of kidney and ureter, unspecified: Secondary | ICD-10-CM

## 2021-01-12 DIAGNOSIS — Z0001 Encounter for general adult medical examination with abnormal findings: Secondary | ICD-10-CM

## 2021-01-12 DIAGNOSIS — E1165 Type 2 diabetes mellitus with hyperglycemia: Secondary | ICD-10-CM

## 2021-01-12 DIAGNOSIS — E538 Deficiency of other specified B group vitamins: Secondary | ICD-10-CM

## 2021-01-12 DIAGNOSIS — N200 Calculus of kidney: Secondary | ICD-10-CM

## 2021-01-12 DIAGNOSIS — I10 Essential (primary) hypertension: Secondary | ICD-10-CM

## 2021-01-12 LAB — URINALYSIS, ROUTINE W REFLEX MICROSCOPIC
Hgb urine dipstick: NEGATIVE
Ketones, ur: NEGATIVE
Leukocytes,Ua: NEGATIVE
Nitrite: NEGATIVE
RBC / HPF: NONE SEEN (ref 0–?)
Specific Gravity, Urine: >=1.03 — AB (ref 1.000–1.030)
Total Protein, Urine: NEGATIVE
Urine Glucose: 100 — AB
Urobilinogen, UA: 0.2 (ref 0.0–1.0)
WBC, UA: NONE SEEN (ref 0–?)
pH: 5 (ref 5.0–8.0)

## 2021-01-12 LAB — BASIC METABOLIC PANEL
BUN: 12 mg/dL (ref 6–23)
CO2: 30 mEq/L (ref 19–32)
Calcium: 9.5 mg/dL (ref 8.4–10.5)
Chloride: 101 mEq/L (ref 96–112)
Creatinine, Ser: 1.11 mg/dL (ref 0.40–1.50)
GFR: 73.27 mL/min (ref >60.00)
Glucose, Bld: 90 mg/dL (ref 70–99)
Potassium: 4.6 mEq/L (ref 3.5–5.1)
Sodium: 138 mEq/L (ref 135–145)

## 2021-01-12 LAB — HEPATIC FUNCTION PANEL
ALT: 19 U/L (ref 0–53)
AST: 13 U/L (ref 0–37)
Albumin: 4.1 g/dL (ref 3.5–5.2)
Alkaline Phosphatase: 52 U/L (ref 39–117)
Bilirubin, Direct: 0.1 mg/dL (ref 0.0–0.3)
Total Bilirubin: 0.4 mg/dL (ref 0.2–1.2)
Total Protein: 6.8 g/dL (ref 6.0–8.3)

## 2021-01-12 LAB — CBC WITH DIFFERENTIAL/PLATELET
Basophils Absolute: 0 10*3/uL (ref 0.0–0.1)
Basophils Relative: 0.3 % (ref 0.0–3.0)
Eosinophils Absolute: 0.3 10*3/uL (ref 0.0–0.7)
Eosinophils Relative: 2.2 % (ref 0.0–5.0)
HCT: 43.4 % (ref 39.0–52.0)
Hemoglobin: 14.6 g/dL (ref 13.0–17.0)
Lymphocytes Relative: 27.8 % (ref 12.0–46.0)
Lymphs Abs: 3.5 10*3/uL (ref 0.7–4.0)
MCHC: 33.5 g/dL (ref 30.0–36.0)
MCV: 87.2 fl (ref 78.0–100.0)
Monocytes Absolute: 0.7 10*3/uL (ref 0.1–1.0)
Monocytes Relative: 5.4 % (ref 3.0–12.0)
Neutro Abs: 8.2 10*3/uL — ABNORMAL HIGH (ref 1.4–7.7)
Neutrophils Relative %: 64.3 % (ref 43.0–77.0)
Platelets: 296 10*3/uL (ref 150.0–400.0)
RBC: 4.98 Mil/uL (ref 4.22–5.81)
RDW: 13.3 % (ref 11.5–15.5)
WBC: 12.7 10*3/uL — ABNORMAL HIGH (ref 4.0–10.5)

## 2021-01-12 LAB — LIPID PANEL
Cholesterol: 130 mg/dL (ref 0–200)
HDL: 32.7 mg/dL — ABNORMAL LOW (ref >39.00)
LDL Cholesterol: 59 mg/dL (ref 0–99)
NonHDL: 97.74
Total CHOL/HDL Ratio: 4
Triglycerides: 195 mg/dL — ABNORMAL HIGH (ref 0.0–149.0)
VLDL: 39 mg/dL (ref 0.0–40.0)

## 2021-01-12 LAB — VITAMIN D 25 HYDROXY (VIT D DEFICIENCY, FRACTURES): VITD: 49.36 ng/mL (ref 30.00–100.00)

## 2021-01-12 LAB — MICROALBUMIN / CREATININE URINE RATIO
Creatinine,U: 302.5 mg/dL
Microalb Creat Ratio: 1.1 mg/g (ref 0.0–30.0)
Microalb, Ur: 3.4 mg/dL — ABNORMAL HIGH (ref 0.0–1.9)

## 2021-01-12 LAB — PSA: PSA: 0.31 ng/mL (ref 0.10–4.00)

## 2021-01-12 LAB — TSH: TSH: 1.98 u[IU]/mL (ref 0.35–4.50)

## 2021-01-12 LAB — HEMOGLOBIN A1C: Hgb A1c MFr Bld: 7.8 % — ABNORMAL HIGH (ref 4.6–6.5)

## 2021-01-12 MED ORDER — THERA-D 2000 50 MCG (2000 UT) PO TABS: ORAL_TABLET | ORAL | 99 refills | Status: AC

## 2021-01-12 NOTE — Patient Instructions (Signed)
Please take OTC Vitamin D3 at 2000 units per day, indefinitely.  Please continue all other medications as before, and refills have been done if requested.  Please have the pharmacy call with any other refills you may need.  Please continue your efforts at being more active, low cholesterol diet, and weight control.  You are otherwise up to date with prevention measures today.  Please keep your appointments with your specialists as you may have planned  Please go to the LAB at the blood drawing area for the tests to be done  You will be contacted by phone if any changes need to be made immediately.  Otherwise, you will receive a letter about your results with an explanation, but please check with MyChart first.  Please remember to sign up for MyChart if you have not done so, as this will be important to you in the future with finding out test results, communicating by private email, and scheduling acute appointments online when needed.  Please make an Appointment to return in 6 months, or sooner if needed, also with Lab to be done at the [facility] lab

## 2021-01-12 NOTE — Assessment & Plan Note (Signed)
Resolved after lithrotryipsy last yr

## 2021-01-12 NOTE — Progress Notes (Signed)
Patient ID: Martin Lozano, male   DOB: 1961/09/01, 60 y.o.   MRN: [id number]         Chief Complaint:: wellness exam and low vit d, dm, renal insufficiency, htn, hld       HPI:  Martin Lozano is a 60 y.o. male here for wellness exam; due for Tdap but declines; plans to get eye exam soon; for foot exam; declines covid booster for now; o/w up to date with preventive referrals and immunizations                         Also son had covid infection 3 mo ago but pt never had the infection; pt himself had covid infection and hospd oct 2020.  Also took 3 mo to completely pass fragments of kidney stone late last year after lithotripsy.   Today says he feels the best he has in 2 yrs, but took approx 1 yr to feel normal after the covid infection.  Taking the Vit D supplement.  Pt denies chest pain, increased sob or doe, wheezing, orthopnea, PND, increased LE swelling, palpitations, dizziness or syncope.  . Pt denies polydipsia, polyuria denies new focal neuro s/s.   Pt denies fever, wt loss, night sweats, loss of appetite, or other constitutional symptoms       Wt Readings from Last 3 Encounters:  01/12/21 (!) 313 lb (142 kg)  09/18/20 299 lb (135.6 kg)  07/28/20 (!) 303 lb (137.4 kg)   BP Readings from Last 3 Encounters:  01/12/21 120/74  09/18/20 122/80  07/28/20 130/85   Immunization History  Administered Date(s) Administered  . Influenza Whole 10/06/2010  . Influenza,inj,Quad PF,6+ Mos 06/25/2016, 09/16/2017, 07/23/2019  . PFIZER(Purple Top)SARS-COV-2 Vaccination 04/11/2020, 05/19/2020  . Pneumococcal Conjugate-13 03/10/2017  . Pneumococcal Polysaccharide-23 11/22/2018  . Td 06/18/2009   Health Maintenance Due  Topic Date Due  . COLONOSCOPY (Pts 45-46yrs Insurance coverage will need to be confirmed)  Never done    Past Medical History:  Diagnosis Date  . ALLERGIC RHINITIS 09/06/2007  . ASTHMATIC BRONCHITIS, ACUTE 11/16/2007  . HEMORRHOIDS 07/03/2007  . HYPERLIPIDEMIA 09/06/2007  .  HYPERTENSION 07/03/2007  . KNEE SPRAIN, LEFT 07/30/2008  . OBESITY, MORBID 07/03/2007  . Other tenosynovitis of hand and wrist 06/11/2010  . OTITIS MEDIA, ACUTE, LEFT 11/10/2009  . Type II or unspecified type diabetes mellitus without mention of complication, uncontrolled 09/25/2012  . Wheezing 09/09/2009   Past Surgical History:  Procedure Laterality Date  . EXTRACORPOREAL SHOCK WAVE LITHOTRIPSY Left 07/28/2020   Procedure: LEFT EXTRACORPOREAL SHOCK WAVE LITHOTRIPSY (ESWL);  Surgeon: Jerilee Field, MD;  Location: [facility];  Service: Urology;  Laterality: Left;  . WISDOM TOOTH EXTRACTION      reports that he has never smoked. He has never used smokeless tobacco. He reports current alcohol use. He reports that he does not use drugs. family history is not on file. No Known Allergies Current Outpatient Medications on File Prior to Visit  Medication Sig Dispense Refill  . aspirin 81 MG tablet Take 81 mg by mouth daily.    . cyclobenzaprine (FLEXERIL) 5 MG tablet Take 1 tablet (5 mg total) by mouth 3 (three) times daily as needed for muscle spasms. 30 tablet 1  . HYDROcodone-acetaminophen (NORCO/VICODIN) 5-325 MG tablet Take 1 tablet by mouth every 6 (six) hours as needed. 30 tablet 0  . lisinopril (ZESTRIL) 20 MG tablet TAKE 1 TABLET BY MOUTH EVERY DAY 30 tablet 0  .  metFORMIN (GLUCOPHAGE) 500 MG tablet 2 tab by mouth in the AM, and 2 tab in the PM 360 tablet 3  . ondansetron (ZOFRAN ODT) 4 MG disintegrating tablet Take 1 tablet (4 mg total) by mouth every 8 (eight) hours as needed for nausea or vomiting. 20 tablet 1  . rosuvastatin (CRESTOR) 20 MG tablet Take 1 tablet (20 mg total) by mouth daily. 90 tablet 3  . tamsulosin (FLOMAX) 0.4 MG CAPS capsule TAKE 1 CAPSULE BY MOUTH EVERY DAY 90 capsule 2  . vitamin B-12 (CYANOCOBALAMIN) 1000 MCG tablet Take 1 tablet (1,000 mcg total) by mouth daily. 90 tablet 3   No current facility-administered medications on file prior to visit.         ROS:  All others reviewed and negative.  Objective        PE:  BP 120/74 (BP Location: Left Arm, Patient Position: Sitting, Cuff Size: Large)   Pulse 85   Ht 5\' 11"  (1.803 m)   Wt (!) 313 lb (142 kg)   SpO2 98%   BMI 43.65 kg/m                 Constitutional: Pt appears in NAD               HENT: Head: NCAT.                Right Ear: External ear normal.                 Left Ear: External ear normal.                Eyes: . Pupils are equal, round, and reactive to light. Conjunctivae and EOM are normal               Nose: without d/c or deformity               Neck: Neck supple. Gross normal ROM               Cardiovascular: Normal rate and regular rhythm.                 Pulmonary/Chest: Effort normal and breath sounds without rales or wheezing.                Abd:  Soft, NT, ND, + BS, no organomegaly               Neurological: Pt is alert. At baseline orientation, motor grossly intact               Skin: Skin is warm. No rashes, no other new lesions, LE edema - none               Psychiatric: Pt behavior is normal without agitation   Micro: none  Cardiac tracings I have personally interpreted today:  none  Pertinent Radiological findings (summarize): none   Lab Results  Component Value Date   WBC 12.7 (H) 01/12/2021   HGB 14.6 01/12/2021   HCT 43.4 01/12/2021   PLT 296.0 01/12/2021   GLUCOSE 90 01/12/2021   CHOL 130 01/12/2021   TRIG 195.0 (H) 01/12/2021   HDL 32.70 (L) 01/12/2021   LDLDIRECT 88.0 11/02/2018   LDLCALC 59 01/12/2021   ALT 19 01/12/2021   AST 13 01/12/2021   NA 138 01/12/2021   K 4.6 01/12/2021   CL 101 01/12/2021   CREATININE 1.11 01/12/2021   BUN 12 01/12/2021   CO2 30 01/12/2021   TSH  1.98 01/12/2021   PSA 0.31 01/12/2021   HGBA1C 7.8 (H) 01/12/2021   MICROALBUR 3.4 (H) 01/12/2021   Assessment/Plan:  Martin Lozano is a 60 y.o. White or Caucasian [1] male with  has a past medical history of ALLERGIC RHINITIS (09/06/2007),  ASTHMATIC BRONCHITIS, ACUTE (11/16/2007), HEMORRHOIDS (07/03/2007), HYPERLIPIDEMIA (09/06/2007), HYPERTENSION (07/03/2007), KNEE SPRAIN, LEFT (07/30/2008), OBESITY, MORBID (07/03/2007), Other tenosynovitis of hand and wrist (06/11/2010), OTITIS MEDIA, ACUTE, LEFT (11/10/2009), Type II or unspecified type diabetes mellitus without mention of complication, uncontrolled (09/25/2012), and Wheezing (09/09/2009).  Renal stones Resolved after lithrotryipsy last yr  Encounter for well adult exam with abnormal findings Age and sex appropriate education and counseling updated with regular exercise and diet Referrals for preventative services - pt plans to call for eye exam Immunizations addressed - declines tdap and covid booster Smoking counseling  - none needed Evidence for depression or other mood disorder - none significant Most recent labs reviewed. I have personally reviewed and have noted: 1) the patient's medical and social history 2) The patient's current medications and supplements 3) The patient's height, weight, and BMI have been recorded in the chart   Vitamin D deficiency Last vitamin D Lab Results  Component Value Date   VD25OH 49.36 01/12/2021   Stable, cont oral replacement  Vitamin B12 deficiency Lab Results  Component Value Date   VITAMINB12 747 07/17/2020   Stable, cont oral replacement - b12 1000 mcg qd   Hyperlipidemia Lab Results  Component Value Date   LDLCALC 59 01/12/2021   Stable, pt to continue current statin crestor 20   Diabetes (HCC) Lab Results  Component Value Date   HGBA1C 7.8 (H) 01/12/2021   uncontrolled, pt to continue current medical treatment glucotrol, metformin; pt needs med such as trulicity but declines, wants to cont work on diet and wt control before adding any further med  Renal insufficiency With mild worsening,  Lab Results  Component Value Date   CREATININE 1.11 01/12/2021  not clear if means ckd - for f/u next visit  Essential  hypertension BP Readings from Last 3 Encounters:  01/12/21 120/74  09/18/20 122/80  07/28/20 130/85   Stable, pt to continue medical treatment zestril 20   Followup: Return in about 6 months (around 07/14/2021).  Oliver Barre, MD 01/18/2021 9:24 PM [facility]

## 2021-01-13 ENCOUNTER — Encounter: Payer: Self-pay | Admitting: Internal Medicine

## 2021-01-13 ENCOUNTER — Other Ambulatory Visit: Payer: Self-pay | Admitting: Internal Medicine

## 2021-01-13 MED ORDER — GLIPIZIDE ER 5 MG PO TB24: 5.0000 mg | ORAL_TABLET | Freq: Every day | ORAL | 3 refills | Status: DC

## 2021-01-18 ENCOUNTER — Encounter: Payer: Self-pay | Admitting: Internal Medicine

## 2021-01-18 DIAGNOSIS — N289 Disorder of kidney and ureter, unspecified: Secondary | ICD-10-CM | POA: Insufficient documentation

## 2021-01-18 NOTE — Assessment & Plan Note (Signed)
Age and sex appropriate education and counseling updated with regular exercise and diet Referrals for preventative services - pt plans to call for eye exam Immunizations addressed - declines tdap and covid booster Smoking counseling  - none needed Evidence for depression or other mood disorder - none significant Most recent labs reviewed. I have personally reviewed and have noted: 1) the patient's medical and social history 2) The patient's current medications and supplements 3) The patient's height, weight, and BMI have been recorded in the chart

## 2021-01-18 NOTE — Assessment & Plan Note (Signed)
Last vitamin D Lab Results  Component Value Date   VD25OH 49.36 01/12/2021   Stable, cont oral replacement

## 2021-01-18 NOTE — Assessment & Plan Note (Signed)
Lab Results  Component Value Date   VITAMINB12 747 07/17/2020   Stable, cont oral replacement - b12 1000 mcg qd

## 2021-01-18 NOTE — Assessment & Plan Note (Signed)
With mild worsening,  Lab Results  Component Value Date   CREATININE 1.11 01/12/2021  not clear if means ckd - for f/u next visit

## 2021-01-18 NOTE — Assessment & Plan Note (Signed)
Lab Results  Component Value Date   LDLCALC 59 01/12/2021   Stable, pt to continue current statin crestor 20

## 2021-01-18 NOTE — Assessment & Plan Note (Signed)
BP Readings from Last 3 Encounters:  01/12/21 120/74  09/18/20 122/80  07/28/20 130/85   Stable, pt to continue medical treatment zestril 20

## 2021-01-18 NOTE — Assessment & Plan Note (Signed)
Lab Results  Component Value Date   HGBA1C 7.8 (H) 01/12/2021   uncontrolled, pt to continue current medical treatment glucotrol, metformin; pt needs med such as trulicity but declines, wants to cont work on diet and wt control before adding any further med

## 2021-02-03 ENCOUNTER — Other Ambulatory Visit: Payer: Self-pay | Admitting: Internal Medicine

## 2021-02-05 ENCOUNTER — Other Ambulatory Visit: Payer: Self-pay | Admitting: Internal Medicine

## 2021-02-17 ENCOUNTER — Other Ambulatory Visit: Payer: Self-pay | Admitting: Internal Medicine

## 2021-02-28 ENCOUNTER — Other Ambulatory Visit: Payer: Self-pay | Admitting: Internal Medicine

## 2021-02-28 NOTE — Telephone Encounter (Signed)
Please refill as per office routine med refill policy (all routine meds refilled for 3 mo or monthly per pt preference up to one year from last visit, then month to month grace period for 3 mo, then further med refills will have to be denied)  

## 2021-03-25 IMAGING — DX DG CHEST 1V PORT
1 series · 1 of 1 positions shown · non-contrast
Comparison: 01/22/2015

CLINICAL DATA: Shortness of breath, Covera positive 07/05/2019,
hypoxia

EXAM:
PORTABLE CHEST 1 VIEW

[chest ap]
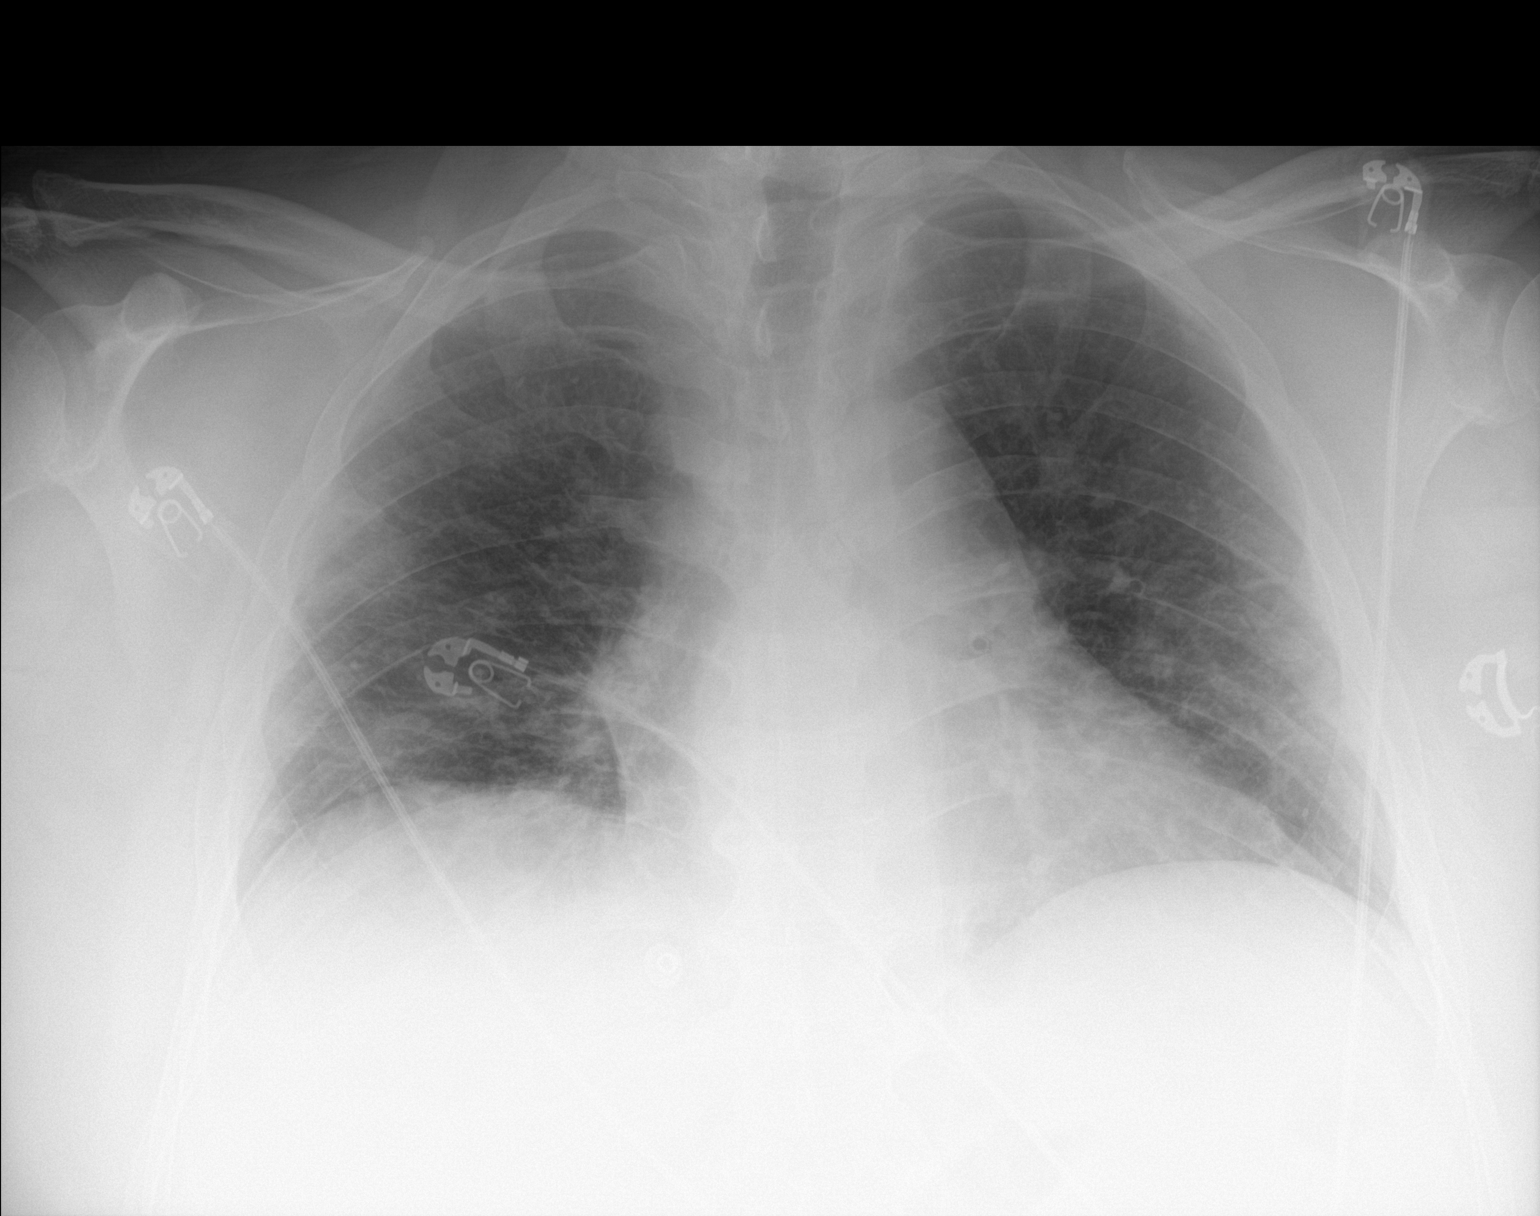

[1 of 1 positions shown; findings below may reference images not displayed]

FINDINGS: Lower lung volumes with increased mid and lower lung mixed
interstitial and airspace opacities concerning for pneumonia. Stable
heart size and vascularity. No effusion or pneumothorax. Trachea
midline. Degenerative changes of the spine.
IMPRESSION: Increased mid and lower lung mixed interstitial and peripheral
airspace opacities concerning for developing pneumonia.

## 2021-03-27 ENCOUNTER — Other Ambulatory Visit: Payer: Self-pay | Admitting: Internal Medicine

## 2021-04-17 LAB — HM DIABETES EYE EXAM

## 2021-05-04 ENCOUNTER — Encounter: Payer: Self-pay | Admitting: Internal Medicine

## 2021-07-20 ENCOUNTER — Ambulatory Visit: Payer: Managed Care, Other (non HMO) | Admitting: Internal Medicine

## 2021-07-20 ENCOUNTER — Encounter: Payer: Self-pay | Admitting: Internal Medicine

## 2021-07-20 ENCOUNTER — Other Ambulatory Visit: Payer: Self-pay

## 2021-07-20 ENCOUNTER — Other Ambulatory Visit: Payer: Self-pay | Admitting: Internal Medicine

## 2021-07-20 VITALS — BP 132/70 | HR 76 | Temp 98.7°F | Ht 71.0 in | Wt 319.0 lb

## 2021-07-20 DIAGNOSIS — E7800 Pure hypercholesterolemia, unspecified: Secondary | ICD-10-CM

## 2021-07-20 DIAGNOSIS — E559 Vitamin D deficiency, unspecified: Secondary | ICD-10-CM

## 2021-07-20 DIAGNOSIS — E1165 Type 2 diabetes mellitus with hyperglycemia: Secondary | ICD-10-CM | POA: Diagnosis not present

## 2021-07-20 DIAGNOSIS — I10 Essential (primary) hypertension: Secondary | ICD-10-CM

## 2021-07-20 DIAGNOSIS — Z0000 Encounter for general adult medical examination without abnormal findings: Secondary | ICD-10-CM

## 2021-07-20 DIAGNOSIS — E538 Deficiency of other specified B group vitamins: Secondary | ICD-10-CM | POA: Diagnosis not present

## 2021-07-20 LAB — BASIC METABOLIC PANEL
BUN: 13 mg/dL (ref 6–23)
CO2: 28 mEq/L (ref 19–32)
Calcium: 9.3 mg/dL (ref 8.4–10.5)
Chloride: 103 mEq/L (ref 96–112)
Creatinine, Ser: 1.11 mg/dL (ref 0.40–1.50)
GFR: 73 mL/min (ref >60.00)
Glucose, Bld: 110 mg/dL — ABNORMAL HIGH (ref 70–99)
Potassium: 4.2 mEq/L (ref 3.5–5.1)
Sodium: 138 mEq/L (ref 135–145)

## 2021-07-20 LAB — HEPATIC FUNCTION PANEL
ALT: 18 U/L (ref 0–53)
AST: 15 U/L (ref 0–37)
Albumin: 4.2 g/dL (ref 3.5–5.2)
Alkaline Phosphatase: 53 U/L (ref 39–117)
Bilirubin, Direct: 0 mg/dL (ref 0.0–0.3)
Total Bilirubin: 0.3 mg/dL (ref 0.2–1.2)
Total Protein: 7.1 g/dL (ref 6.0–8.3)

## 2021-07-20 LAB — LIPID PANEL
Cholesterol: 138 mg/dL (ref 0–200)
HDL: 29.7 mg/dL — ABNORMAL LOW (ref >39.00)
LDL Cholesterol: 71 mg/dL (ref 0–99)
NonHDL: 108.18
Total CHOL/HDL Ratio: 5
Triglycerides: 184 mg/dL — ABNORMAL HIGH (ref 0.0–149.0)
VLDL: 36.8 mg/dL (ref 0.0–40.0)

## 2021-07-20 LAB — HEMOGLOBIN A1C: Hgb A1c MFr Bld: 7.7 % — ABNORMAL HIGH (ref 4.6–6.5)

## 2021-07-20 MED ORDER — GLIPIZIDE ER 10 MG PO TB24: 10.0000 mg | ORAL_TABLET | Freq: Every day | ORAL | 3 refills | Status: DC

## 2021-07-20 NOTE — Assessment & Plan Note (Signed)
Lab Results  Component Value Date   LDLCALC 71 07/20/2021   Mild uncontrolled, goal ldl < 70, pt to continue current statin crestor 20, declines increased, for lower chol diet

## 2021-07-20 NOTE — Assessment & Plan Note (Signed)
Last vitamin D Lab Results  Component Value Date   VD25OH 49.36 01/12/2021   Stable, cont oral replacement 

## 2021-07-20 NOTE — Assessment & Plan Note (Addendum)
Lab Results  Component Value Date   HGBA1C 7.8 (H) 01/12/2021   Uncontrolled, goal a1c <7, pt to continue current medical treatment metformin and glipizde, for f/u lab today

## 2021-07-20 NOTE — Assessment & Plan Note (Signed)
BP Readings from Last 3 Encounters:  07/20/21 132/70  01/12/21 120/74  09/18/20 122/80   Stable, pt to continue medical treatment lisinopril

## 2021-07-20 NOTE — Assessment & Plan Note (Signed)
Lab Results  Component Value Date   VITAMINB12 747 07/17/2020   Stable, cont oral replacement - b12 1000 mcg qd  

## 2021-07-20 NOTE — Progress Notes (Signed)
Patient ID: Martin Lozano, male   DOB: 28-Aug-1961, 60 y.o.   MRN: [id number]        Chief Complaint: follow up HTN, HLD and hyperglycemia,, low Vit D and b12       HPI:  Martin Lozano is a 60 y.o. male here overall doing ok, Pt denies chest pain, increased sob or doe, wheezing, orthopnea, PND, increased LE swelling, palpitations, dizziness or syncope.   Pt denies polydipsia, polyuria, or new focal neuro s/s.   Pt denies fever, wt loss, night sweats, loss of appetite, or other constitutional symptoms  No new complaints    Plans to call for the colonoscopy soon.   Wt Readings from Last 3 Encounters:  07/20/21 (!) 319 lb (144.7 kg)  01/12/21 (!) 313 lb (142 kg)  09/18/20 299 lb (135.6 kg)   BP Readings from Last 3 Encounters:  07/20/21 132/70  01/12/21 120/74  09/18/20 122/80         Past Medical History:  Diagnosis Date   ALLERGIC RHINITIS 09/06/2007   ASTHMATIC BRONCHITIS, ACUTE 11/16/2007   HEMORRHOIDS 07/03/2007   HYPERLIPIDEMIA 09/06/2007   HYPERTENSION 07/03/2007   KNEE SPRAIN, LEFT 07/30/2008   OBESITY, MORBID 07/03/2007   Other tenosynovitis of hand and wrist 06/11/2010   OTITIS MEDIA, ACUTE, LEFT 11/10/2009   Type II or unspecified type diabetes mellitus without mention of complication, uncontrolled 09/25/2012   Wheezing 09/09/2009   Past Surgical History:  Procedure Laterality Date   EXTRACORPOREAL SHOCK WAVE LITHOTRIPSY Left 07/28/2020   Procedure: LEFT EXTRACORPOREAL SHOCK WAVE LITHOTRIPSY (ESWL);  Surgeon: Jerilee Field, MD;  Location: [facility];  Service: Urology;  Laterality: Left;   WISDOM TOOTH EXTRACTION      reports that he has never smoked. He has never used smokeless tobacco. He reports current alcohol use. He reports that he does not use drugs. family history is not on file. No Known Allergies Current Outpatient Medications on File Prior to Visit  Medication Sig Dispense Refill   aspirin 81 MG tablet Take 81 mg by mouth daily.      Cholecalciferol (THERA-D 2000) 50 MCG (2000 UT) TABS 1 tab by mouth once daily 30 tablet 99   [facility] VITAMIN B12 1000 MCG tablet TAKE 1 TABLET BY MOUTH EVERY DAY 200 tablet 3   cyclobenzaprine (FLEXERIL) 5 MG tablet Take 1 tablet (5 mg total) by mouth 3 (three) times daily as needed for muscle spasms. 30 tablet 1   HYDROcodone-acetaminophen (NORCO/VICODIN) 5-325 MG tablet Take 1 tablet by mouth every 6 (six) hours as needed. 30 tablet 0   lisinopril (ZESTRIL) 20 MG tablet TAKE 1 TABLET BY MOUTH EVERY DAY 90 tablet 1   metFORMIN (GLUCOPHAGE) 500 MG tablet TAKE 2 TABLETS BY MOUTH AM AND 2 TABLETS IN THE EVENING 360 tablet 3   ondansetron (ZOFRAN ODT) 4 MG disintegrating tablet Take 1 tablet (4 mg total) by mouth every 8 (eight) hours as needed for nausea or vomiting. 20 tablet 1   rosuvastatin (CRESTOR) 20 MG tablet Take 1 tablet (20 mg total) by mouth daily. 90 tablet 3   tamsulosin (FLOMAX) 0.4 MG CAPS capsule TAKE 1 CAPSULE BY MOUTH EVERY DAY 90 capsule 2   No current facility-administered medications on file prior to visit.        ROS:  All others reviewed and negative.  Objective        PE:  BP 132/70 (BP Location: Right Arm, Patient Position: Sitting, Cuff Size: Large)   Pulse  76   Temp 98.7 F (37.1 C) (Oral)   Ht 5\' 11"  (1.803 m)   Wt (!) 319 lb (144.7 kg)   SpO2 98%   BMI 44.49 kg/m                 Constitutional: Pt appears in NAD               HENT: Head: NCAT.                Right Ear: External ear normal.                 Left Ear: External ear normal.                Eyes: . Pupils are equal, round, and reactive to light. Conjunctivae and EOM are normal               Nose: without d/c or deformity               Neck: Neck supple. Gross normal ROM               Cardiovascular: Normal rate and regular rhythm.                 Pulmonary/Chest: Effort normal and breath sounds without rales or wheezing.                Abd:  Soft, NT, ND, + BS, no organomegaly                Neurological: Pt is alert. At baseline orientation, motor grossly intact               Skin: Skin is warm. No rashes, no other new lesions, LE edema - none               Psychiatric: Pt behavior is normal without agitation   Micro: none  Cardiac tracings I have personally interpreted today:  none  Pertinent Radiological findings (summarize): none   Lab Results  Component Value Date   WBC 12.7 (H) 01/12/2021   HGB 14.6 01/12/2021   HCT 43.4 01/12/2021   PLT 296.0 01/12/2021   GLUCOSE 110 (H) 07/20/2021   CHOL 138 07/20/2021   TRIG 184.0 (H) 07/20/2021   HDL 29.70 (L) 07/20/2021   LDLDIRECT 88.0 11/02/2018   LDLCALC 71 07/20/2021   ALT 18 07/20/2021   AST 15 07/20/2021   NA 138 07/20/2021   K 4.2 07/20/2021   CL 103 07/20/2021   CREATININE 1.11 07/20/2021   BUN 13 07/20/2021   CO2 28 07/20/2021   TSH 1.98 01/12/2021   PSA 0.31 01/12/2021   HGBA1C 7.7 (H) 07/20/2021   MICROALBUR 3.4 (H) 01/12/2021   Assessment/Plan:  Martin Lozano is a 60 y.o. White or Caucasian [1] male with  has a past medical history of ALLERGIC RHINITIS (09/06/2007), ASTHMATIC BRONCHITIS, ACUTE (11/16/2007), HEMORRHOIDS (07/03/2007), HYPERLIPIDEMIA (09/06/2007), HYPERTENSION (07/03/2007), KNEE SPRAIN, LEFT (07/30/2008), OBESITY, MORBID (07/03/2007), Other tenosynovitis of hand and wrist (06/11/2010), OTITIS MEDIA, ACUTE, LEFT (11/10/2009), Type II or unspecified type diabetes mellitus without mention of complication, uncontrolled (09/25/2012), and Wheezing (09/09/2009).  Vitamin D deficiency Last vitamin D Lab Results  Component Value Date   VD25OH 49.36 01/12/2021   Stable, cont oral replacement   Vitamin B12 deficiency Lab Results  Component Value Date   VITAMINB12 747 07/17/2020   Stable, cont oral replacement - b12 1000 mcg qd   Diabetes (HCC) Lab Results  Component  Value Date   HGBA1C 7.8 (H) 01/12/2021   Uncontrolled, goal a1c <7, pt to continue current medical treatment metformin and  glipizde, for f/u lab today   Hyperlipidemia Lab Results  Component Value Date   LDLCALC 71 07/20/2021   Mild uncontrolled, goal ldl < 70, pt to continue current statin crestor 20, declines increased, for lower chol diet   Essential hypertension BP Readings from Last 3 Encounters:  07/20/21 132/70  01/12/21 120/74  09/18/20 122/80   Stable, pt to continue medical treatment lisinopril  Followup: Return in about 6 months (around 01/18/2022), or if symptoms worsen or fail to improve.  Oliver Barre, MD 07/20/2021 9:50 PM [facility] [place] [facility]

## 2021-07-20 NOTE — Addendum Note (Signed)
Addended by: [name] on: 07/20/2021 09:52 PM   Modules accepted: Orders

## 2021-07-20 NOTE — Patient Instructions (Addendum)
Please consider the Novavax covid vaccine  Please remember to call for the colonoscopy as you are due  Please continue all other medications as before, and refills have been done if requested.  Please have the pharmacy call with any other refills you may need.  Please continue your efforts at being more active, low cholesterol diet, and weight control  Please keep your appointments with your specialists as you may have planned  Please go to the LAB at the blood drawing area for the tests to be done  You will be contacted by phone if any changes need to be made immediately.  Otherwise, you will receive a letter about your results with an explanation, but please check with MyChart first.  Please remember to sign up for MyChart if you have not done so, as this will be important to you in the future with finding out test results, communicating by private email, and scheduling acute appointments online when needed.  Please make an Appointment to return in 6 months, or sooner if needed

## 2021-08-28 ENCOUNTER — Other Ambulatory Visit: Payer: Self-pay | Admitting: Internal Medicine

## 2021-08-28 NOTE — Telephone Encounter (Signed)
Please refill as per office routine med refill policy (all routine meds to be refilled for 3 mo or monthly (per pt preference) up to one year from last visit, then month to month grace period for 3 mo, then further med refills will have to be denied) ? ?

## 2021-09-17 ENCOUNTER — Encounter: Payer: Self-pay | Admitting: Internal Medicine

## 2021-09-17 ENCOUNTER — Ambulatory Visit: Payer: Managed Care, Other (non HMO) | Admitting: Internal Medicine

## 2021-09-17 ENCOUNTER — Other Ambulatory Visit: Payer: Self-pay

## 2021-09-17 VITALS — BP 132/70 | HR 84 | Temp 99.2°F | Ht 71.0 in | Wt 323.0 lb

## 2021-09-17 DIAGNOSIS — R059 Cough, unspecified: Secondary | ICD-10-CM | POA: Diagnosis not present

## 2021-09-17 DIAGNOSIS — J329 Chronic sinusitis, unspecified: Secondary | ICD-10-CM | POA: Diagnosis not present

## 2021-09-17 DIAGNOSIS — I10 Essential (primary) hypertension: Secondary | ICD-10-CM

## 2021-09-17 DIAGNOSIS — E1165 Type 2 diabetes mellitus with hyperglycemia: Secondary | ICD-10-CM

## 2021-09-17 MED ORDER — LEVOFLOXACIN 500 MG PO TABS: 500.0000 mg | ORAL_TABLET | Freq: Every day | ORAL | 0 refills | Status: AC

## 2021-09-17 MED ORDER — HYDROCODONE BIT-HOMATROP MBR 5-1.5 MG/5ML PO SOLN: 5.0000 mL | Freq: Four times a day (QID) | ORAL | 0 refills | Status: AC | PRN

## 2021-09-17 NOTE — Patient Instructions (Signed)
Please take all new medication as prescribed - the antibiotic, and cough medicine ° °Please continue all other medications as before, and refills have been done if requested. ° °Please have the pharmacy call with any other refills you may need. ° °Please continue your efforts at being more active, low cholesterol diet, and weight control. ° °Please keep your appointments with your specialists as you may have planned ° ° ° °

## 2021-09-17 NOTE — Progress Notes (Addendum)
Patient ID: Martin Lozano, male   DOB: 12-11-1960, 60 y.o.   MRN: [id number]        Chief Complaint: follow up HTN, HLD and hyperglycemia, cough       HPI:  Martin Lozano is a 60 y.o. male  Here with 3 days worsening acute onset fever, facial pain, pressure, headache, general weakness and malaise, and greenish d/c, with mild ST and cough, but pt denies chest pain, wheezing, increased sob or doe, orthopnea, PND, increased LE swelling, palpitations, dizziness or syncope.   Pt denies polydipsia, polyuria, or new focal neuro s/s.  No other new complaints      Wt Readings from Last 3 Encounters:  09/17/21 (!) 323 lb (146.5 kg)  07/20/21 (!) 319 lb (144.7 kg)  01/12/21 (!) 313 lb (142 kg)   BP Readings from Last 3 Encounters:  09/17/21 132/70  07/20/21 132/70  01/12/21 120/74         Past Medical History:  Diagnosis Date   ALLERGIC RHINITIS 09/06/2007   ASTHMATIC BRONCHITIS, ACUTE 11/16/2007   HEMORRHOIDS 07/03/2007   HYPERLIPIDEMIA 09/06/2007   HYPERTENSION 07/03/2007   KNEE SPRAIN, LEFT 07/30/2008   OBESITY, MORBID 07/03/2007   Other tenosynovitis of hand and wrist 06/11/2010   OTITIS MEDIA, ACUTE, LEFT 11/10/2009   Type II or unspecified type diabetes mellitus without mention of complication, uncontrolled 09/25/2012   Wheezing 09/09/2009   Past Surgical History:  Procedure Laterality Date   EXTRACORPOREAL SHOCK WAVE LITHOTRIPSY Left 07/28/2020   Procedure: LEFT EXTRACORPOREAL SHOCK WAVE LITHOTRIPSY (ESWL);  Surgeon: Jerilee Field, MD;  Location: [facility];  Service: Urology;  Laterality: Left;   WISDOM TOOTH EXTRACTION      reports that he has never smoked. He has never used smokeless tobacco. He reports current alcohol use. He reports that he does not use drugs. family history is not on file. No Known Allergies Current Outpatient Medications on File Prior to Visit  Medication Sig Dispense Refill   aspirin 81 MG tablet Take 81 mg by mouth daily.      Cholecalciferol (THERA-D 2000) 50 MCG (2000 UT) TABS 1 tab by mouth once daily 30 tablet 99   [facility] VITAMIN B12 1000 MCG tablet TAKE 1 TABLET BY MOUTH EVERY DAY 200 tablet 3   cyclobenzaprine (FLEXERIL) 5 MG tablet Take 1 tablet (5 mg total) by mouth 3 (three) times daily as needed for muscle spasms. 30 tablet 1   glipiZIDE (GLIPIZIDE XL) 10 MG 24 hr tablet Take 1 tablet (10 mg total) by mouth daily with breakfast. 90 tablet 3   HYDROcodone-acetaminophen (NORCO/VICODIN) 5-325 MG tablet Take 1 tablet by mouth every 6 (six) hours as needed. 30 tablet 0   lisinopril (ZESTRIL) 20 MG tablet TAKE 1 TABLET BY MOUTH EVERY DAY 90 tablet 1   metFORMIN (GLUCOPHAGE) 500 MG tablet TAKE 2 TABLETS BY MOUTH AM AND 2 TABLETS IN THE EVENING 360 tablet 3   ondansetron (ZOFRAN ODT) 4 MG disintegrating tablet Take 1 tablet (4 mg total) by mouth every 8 (eight) hours as needed for nausea or vomiting. 20 tablet 1   rosuvastatin (CRESTOR) 20 MG tablet Take 1 tablet (20 mg total) by mouth daily. 90 tablet 3   tamsulosin (FLOMAX) 0.4 MG CAPS capsule TAKE 1 CAPSULE BY MOUTH EVERY DAY 90 capsule 2   No current facility-administered medications on file prior to visit.        ROS:  All others reviewed and negative.  Objective  PE:  BP 132/70 (BP Location: Right Arm, Patient Position: Sitting, Cuff Size: Large)    Pulse 84    Temp 99.2 F (37.3 C) (Oral)    Ht 5\' 11"  (1.803 m)    Wt (!) 323 lb (146.5 kg)    SpO2 97%    BMI 45.05 kg/m                 Constitutional: Pt appears in NAD               HENT: Head: NCAT.                Right Ear: External ear normal.                 Left Ear: External ear normal. Bilat tm's with mild erythema.  Max sinus areas mild tender.  Pharynx with mild erythema, no exudate               Eyes: . Pupils are equal, round, and reactive to light. Conjunctivae and EOM are normal               Nose: without d/c or deformity               Neck: Neck supple. Gross normal ROM                Cardiovascular: Normal rate and regular rhythm.                 Pulmonary/Chest: Effort normal and breath sounds without rales or wheezing.                Abd:  Soft, NT, ND, + BS, no organomegaly               Neurological: Pt is alert. At baseline orientation, motor grossly intact               Skin: Skin is warm. No rashes, no other new lesions, LE edema - none               Psychiatric: Pt behavior is normal without agitation   Micro: none  Cardiac tracings I have personally interpreted today:  none  Pertinent Radiological findings (summarize): none   Lab Results  Component Value Date   WBC 12.7 (H) 01/12/2021   HGB 14.6 01/12/2021   HCT 43.4 01/12/2021   PLT 296.0 01/12/2021   GLUCOSE 110 (H) 07/20/2021   CHOL 138 07/20/2021   TRIG 184.0 (H) 07/20/2021   HDL 29.70 (L) 07/20/2021   LDLDIRECT 88.0 11/02/2018   LDLCALC 71 07/20/2021   ALT 18 07/20/2021   AST 15 07/20/2021   NA 138 07/20/2021   K 4.2 07/20/2021   CL 103 07/20/2021   CREATININE 1.11 07/20/2021   BUN 13 07/20/2021   CO2 28 07/20/2021   TSH 1.98 01/12/2021   PSA 0.31 01/12/2021   HGBA1C 7.7 (H) 07/20/2021   MICROALBUR 3.4 (H) 01/12/2021   SARS Coronavirus 2 Ag Negative Negative   Assessment/Plan:  Martin Lozano is a 60 y.o. White or Caucasian [1] male with  has a past medical history of ALLERGIC RHINITIS (09/06/2007), ASTHMATIC BRONCHITIS, ACUTE (11/16/2007), HEMORRHOIDS (07/03/2007), HYPERLIPIDEMIA (09/06/2007), HYPERTENSION (07/03/2007), KNEE SPRAIN, LEFT (07/30/2008), OBESITY, MORBID (07/03/2007), Other tenosynovitis of hand and wrist (06/11/2010), OTITIS MEDIA, ACUTE, LEFT (11/10/2009), Type II or unspecified type diabetes mellitus without mention of complication, uncontrolled (09/25/2012), and Wheezing (09/09/2009).  Sinusitis Mild to mod, for antibx course,  to f/u  any worsening symptoms or concerns  Essential hypertension BP Readings from Last 3 Encounters:  09/17/21 132/70  07/20/21 132/70  01/12/21  120/74   Stable, pt to continue medical treatment lisinopril   Diabetes (HCC) Lab Results  Component Value Date   HGBA1C 7.7 (H) 07/20/2021   Uncontrolled, mild, goal a1c < 7 pt to continue current medical treatment glipizide, metformin as declines to change  Followup: Return if symptoms worsen or fail to improve.  Oliver Barre, MD 09/21/2021 11:45 AM [facility]

## 2021-09-20 ENCOUNTER — Encounter: Payer: Self-pay | Admitting: Internal Medicine

## 2021-09-20 NOTE — Assessment & Plan Note (Signed)
Mild to mod, for antibx course,  to f/u any worsening symptoms or concerns 

## 2021-09-20 NOTE — Assessment & Plan Note (Signed)
Lab Results  Component Value Date   HGBA1C 7.7 (H) 07/20/2021   Uncontrolled, mild, goal a1c < 7 pt to continue current medical treatment glipizide, metformin as declines to change

## 2021-09-20 NOTE — Assessment & Plan Note (Signed)
BP Readings from Last 3 Encounters:  09/17/21 132/70  07/20/21 132/70  01/12/21 120/74   Stable, pt to continue medical treatment lisinopril

## 2021-09-21 ENCOUNTER — Ambulatory Visit: Payer: Managed Care, Other (non HMO) | Admitting: Internal Medicine

## 2021-09-21 LAB — POC COVID19 BINAXNOW: SARS Coronavirus 2 Ag: NEGATIVE

## 2021-11-17 ENCOUNTER — Encounter: Payer: Self-pay | Admitting: Internal Medicine

## 2021-11-17 NOTE — Telephone Encounter (Signed)
Called patient to inform patient that his Glipizide prescription was at the pharmacy and he needed to call to request a refill

## 2021-11-25 ENCOUNTER — Other Ambulatory Visit: Payer: Self-pay | Admitting: Internal Medicine

## 2021-11-25 NOTE — Telephone Encounter (Signed)
Please refill as per office routine med refill policy (all routine meds to be refilled for 3 mo or monthly (per pt preference) up to one year from last visit, then month to month grace period for 3 mo, then further med refills will have to be denied) ? ?

## 2022-01-12 ENCOUNTER — Encounter: Payer: Self-pay | Admitting: Internal Medicine

## 2022-01-13 ENCOUNTER — Encounter: Payer: Self-pay | Admitting: Emergency Medicine

## 2022-01-13 ENCOUNTER — Ambulatory Visit: Payer: Managed Care, Other (non HMO) | Admitting: Emergency Medicine

## 2022-01-13 VITALS — BP 130/76 | HR 109 | Temp 98.6°F | Ht 71.0 in | Wt 312.0 lb

## 2022-01-13 DIAGNOSIS — R051 Acute cough: Secondary | ICD-10-CM

## 2022-01-13 DIAGNOSIS — J029 Acute pharyngitis, unspecified: Secondary | ICD-10-CM | POA: Diagnosis not present

## 2022-01-13 MED ORDER — AZITHROMYCIN 250 MG PO TABS: ORAL_TABLET | ORAL | 0 refills | Status: DC

## 2022-01-13 MED ORDER — HYDROCODONE BIT-HOMATROP MBR 5-1.5 MG/5ML PO SOLN: 5.0000 mL | Freq: Every evening | ORAL | 0 refills | Status: DC | PRN

## 2022-01-13 NOTE — Patient Instructions (Signed)
Pharyngitis ?Pharyngitis is a sore throat (pharynx). This is when there is redness, pain, and swelling in your throat. Most of the time, this condition gets better on its own. In some cases, you may need medicine. ?What are the causes? ?An infection from a virus. ?An infection from bacteria. ?Allergies. ?What increases the risk? ?Being 5-60 years old. ?Being in crowded environments. These include: ?Daycares. ?Schools. ?Dormitories. ?Living in a place with cold temperatures outside. ?Having a weakened disease-fighting (immune) system. ?What are the signs or symptoms? ?Symptoms may vary depending on the cause. Common symptoms include: ?Sore throat. ?Tiredness (fatigue). ?Low-grade fever. ?Stuffy nose. ?Cough. ?Headache. ?Other symptoms may include: ?Glands in the neck (lymph nodes) that are swollen. ?Skin rashes. ?Film on the throat or tonsils. This can be caused by an infection from bacteria. ?Vomiting. ?Red, itchy eyes. ?Loss of appetite. ?Joint pain and muscle aches. ?Tonsils that are temporarily bigger than usual (enlarged). ?How is this treated? ?Many times, treatment is not needed. This condition usually gets better in 3-4 days without treatment. ?If the infection is caused by a bacteria, you may be need to take antibiotics. ?Follow these instructions at home: ?Medicines ?Take over-the-counter and prescription medicines only as told by your doctor. ?If you were prescribed an antibiotic medicine, take it as told by your doctor. Do not stop taking the antibiotic even if you start to feel better. ?Use throat lozenges or sprays to soothe your throat as told by your doctor. ?Children can get pharyngitis. Do not give your child aspirin. ?Managing pain ?To help with pain, try: ?Sipping warm liquids, such as: ?Broth. ?Herbal tea. ?Warm water. ?Eating or drinking cold or frozen liquids, such as frozen ice pops. ?Rinsing your mouth (gargle) with a salt water mixture 3-4 times a day or as needed. ?To make salt water,  dissolve ?-1 tsp (3-6 g) of salt in 1 cup (237 mL) of warm water. ?Do not swallow this mixture. ?Sucking on hard candy or throat lozenges. ?Putting a cool-mist humidifier in your bedroom at night to moisten the air. ?Sitting in the bathroom with the door closed for 5-10 minutes while you run hot water in the shower. ? ?General instructions ? ?Do not smoke or use any products that contain nicotine or tobacco. If you need help quitting, ask your doctor. ?Rest as told by your doctor. ?Drink enough fluid to keep your pee (urine) pale yellow. ?How is this prevented? ?Wash your hands often for at least 20 seconds with soap and water. If soap and water are not available, use hand sanitizer. ?Do not touch your eyes, nose, or mouth with unwashed hands. Wash hands after touching these areas. ?Do not share cups or eating utensils. ?Avoid close contact with people who are sick. ?Contact a doctor if: ?You have large, tender lumps in your neck. ?You have a rash. ?You cough up green, yellow-brown, or bloody spit. ?Get help right away if: ?You have a stiff neck. ?You drool or cannot swallow liquids. ?You cannot drink or take medicines without vomiting. ?You have very bad pain that does not go away with medicine. ?You have problems breathing, and it is not from a stuffy nose. ?You have new pain and swelling in your knees, ankles, wrists, or elbows. ?These symptoms may be an emergency. Get help right away. Call your local emergency services (911 in the [place] ?Do not wait to see if the symptoms will go away. ?Do not drive yourself to the hospital. ?Summary ?Pharyngitis is a sore throat (pharynx). This is   when there is redness, pain, and swelling in your throat. ?Most of the time, pharyngitis gets better on its own. Sometimes, you may need medicine. ?If you were prescribed an antibiotic medicine, take it as told by your doctor. Do not stop taking the antibiotic even if you start to feel better. ?This information is not intended to  replace advice given to you by your health care provider. Make sure you discuss any questions you have with your health care provider. ?Document Revised: 12/17/2020 Document Reviewed: 12/17/2020 ?Elsevier Patient Education ? 2022 [employer] ? ?

## 2022-01-13 NOTE — Progress Notes (Signed)
Martin Lozano ?61 y.o. ? ? ?Chief Complaint  ?Patient presents with  ? Fever  ?  Taken tylenol, 100.9 last night  ? Cough  ? body aches  ? sore throat  ?  Negative covid result at home last night  ? ? ?HISTORY OF PRESENT ILLNESS: ?Acute problem visit today. ?This is a 61 y.o. male complaining of sore throat with fever and cough with body aches that started 3 days ago.  Tested negative for COVID at home.  No other associated symptomatology. ?No other complaints or medical concerns today. ? ?Fever  ?Associated symptoms include congestion, coughing and a sore throat. Pertinent negatives include no abdominal pain, chest pain, diarrhea, headaches, nausea, rash or vomiting.  ?Cough ?Associated symptoms include a fever and a sore throat. Pertinent negatives include no chest pain, headaches or rash.  ? ? ?Prior to Admission medications   ?Medication Sig Start Date End Date Taking? Authorizing Provider  ?aspirin 81 MG tablet Take 81 mg by mouth daily.   Yes [provider]  ?azithromycin (ZITHROMAX) 250 MG tablet Sig as indicated 01/13/22  Yes Leiloni Smithers, Eilleen Kempf, MD  ?Cholecalciferol (THERA-D 2000) 50 MCG (2000 UT) TABS 1 tab by mouth once daily 01/12/21  Yes [name], MD  ?[facility] VITAMIN B12 1000 MCG tablet TAKE 1 TABLET BY MOUTH EVERY DAY 03/27/21  Yes [name], MD  ?cyclobenzaprine (FLEXERIL) 5 MG tablet Take 1 tablet (5 mg total) by mouth 3 (three) times daily as needed for muscle spasms. 03/17/20  Yes [name], MD  ?glipiZIDE (GLIPIZIDE XL) 10 MG 24 hr tablet Take 1 tablet (10 mg total) by mouth daily with breakfast. 07/20/21  Yes [name], MD  ?HYDROcodone bit-homatropine (HYCODAN) 5-1.5 MG/5ML syrup Take 5 mLs by mouth at bedtime as needed for cough. 01/13/22  Yes Gayland Nicol, Eilleen Kempf, MD  ?HYDROcodone-acetaminophen (NORCO/VICODIN) 5-325 MG tablet Take 1 tablet by mouth every 6 (six) hours as needed. 07/17/20  Yes [name], MD  ?lisinopril (ZESTRIL) 20 MG tablet TAKE 1 TABLET BY MOUTH  EVERY DAY 08/31/21  Yes [name], MD  ?metFORMIN (GLUCOPHAGE) 500 MG tablet TAKE 2 TABLETS BY MOUTH AM AND 2 TABLETS IN THE EVENING 03/03/21  Yes [name], MD  ?ondansetron (ZOFRAN ODT) 4 MG disintegrating tablet Take 1 tablet (4 mg total) by mouth every 8 (eight) hours as needed for nausea or vomiting. 07/17/20  Yes [name], MD  ?rosuvastatin (CRESTOR) 20 MG tablet TAKE 1 TABLET BY MOUTH EVERY DAY 11/25/21  Yes [name], MD  ?tamsulosin (FLOMAX) 0.4 MG CAPS capsule TAKE 1 CAPSULE BY MOUTH EVERY DAY 10/09/20  Yes [name], MD  ? ? ?No Known Allergies ? ?Patient Active Problem List  ? Diagnosis Date Noted  ? Renal insufficiency 01/18/2021  ? Renal stones 01/12/2021  ? Acute gastroenteritis 09/21/2020  ? Left flank pain 07/17/2020  ? Vitamin D deficiency 01/26/2020  ? Vitamin B12 deficiency 01/26/2020  ? COVID-19 virus infection 07/10/2019  ? Umbilical hernia 11/02/2018  ? Palpitations 06/25/2016  ? DOE (dyspnea on exertion) 01/21/2015  ? Sinusitis 11/22/2012  ? Diabetes (HCC) 09/25/2012  ? Right knee pain 07/02/2012  ? Encounter for well adult exam with abnormal findings 09/15/2011  ? Hyperlipidemia 09/06/2007  ? Allergic rhinitis 09/06/2007  ? OBESITY, MORBID 07/03/2007  ? Essential hypertension 07/03/2007  ? HEMORRHOIDS 07/03/2007  ? ? ?Past Medical History:  ?Diagnosis Date  ? ALLERGIC RHINITIS 09/06/2007  ? ASTHMATIC  BRONCHITIS, ACUTE 11/16/2007  ? HEMORRHOIDS 07/03/2007  ? HYPERLIPIDEMIA 09/06/2007  ? HYPERTENSION 07/03/2007  ? KNEE SPRAIN, LEFT 07/30/2008  ? OBESITY, MORBID 07/03/2007  ? Other tenosynovitis of hand and wrist 06/11/2010  ? OTITIS MEDIA, ACUTE, LEFT 11/10/2009  ? Type II or unspecified type diabetes mellitus without mention of complication, uncontrolled 09/25/2012  ? Wheezing 09/09/2009  ? ? ?Past Surgical History:  ?Procedure Laterality Date  ? EXTRACORPOREAL SHOCK WAVE LITHOTRIPSY Left 07/28/2020  ? Procedure: LEFT EXTRACORPOREAL SHOCK WAVE LITHOTRIPSY (ESWL);  Surgeon: Festus Aloe, MD;  Location: [facility];  Service: Urology;  Laterality: Left;  ? WISDOM TOOTH EXTRACTION    ? ? ?Social History  ? ?Socioeconomic History  ? Marital status: Married  ?  Spouse name: Not on file  ? Number of children: Not on file  ? Years of education: Not on file  ? Highest education level: Not on file  ?Occupational History  ? Not on file  ?Tobacco Use  ? Smoking status: Never  ? Smokeless tobacco: Never  ?Substance and Sexual Activity  ? Alcohol use: Yes  ?  Alcohol/week: 0.0 standard drinks  ?  Comment: rare  ? Drug use: No  ? Sexual activity: Not on file  ?Other Topics Concern  ? Not on file  ?Social History Narrative  ? Not on file  ? ?Social Determinants of Health  ? ?Financial Resource Strain: Not on file  ?Food Insecurity: Not on file  ?Transportation Needs: Not on file  ?Physical Activity: Not on file  ?Stress: Not on file  ?Social Connections: Not on file  ?Intimate Partner Violence: Not on file  ? ? ?History reviewed. No pertinent family history. ? ? ?Review of Systems  ?Constitutional:  Positive for fever.  ?HENT:  Positive for congestion and sore throat.   ?Respiratory:  Positive for cough.   ?Cardiovascular: Negative.  Negative for chest pain and palpitations.  ?Gastrointestinal:  Negative for abdominal pain, diarrhea, nausea and vomiting.  ?Genitourinary: Negative.   ?Skin: Negative.  Negative for rash.  ?Neurological:  Negative for dizziness and headaches.  ?All other systems reviewed and are negative. ? ?Today's Vitals  ? 01/13/22 1606  ?BP: 130/76  ?Pulse: (!) 109  ?Temp: 98.6 ?F (37 ?C)  ?TempSrc: Oral  ?SpO2: 95%  ?Weight: (!) 312 lb (141.5 kg)  ?Height: 5\' 11"  (1.803 m)  ? ?Body mass index is 43.52 kg/m?. ? ?Physical Exam ?Vitals reviewed.  ?Constitutional:   ?   Appearance: Normal appearance.  ?HENT:  ?   Head: Normocephalic.  ?   Nose: Congestion present.  ?   Mouth/Throat:  ?   Pharynx: Posterior oropharyngeal erythema present. No oropharyngeal exudate.  ?Eyes:  ?    Extraocular Movements: Extraocular movements intact.  ?   Conjunctiva/sclera: Conjunctivae normal.  ?   Pupils: Pupils are equal, round, and reactive to light.  ?Cardiovascular:  ?   Rate and Rhythm: Normal rate and regular rhythm.  ?   Pulses: Normal pulses.  ?   Heart sounds: Normal heart sounds.  ?Pulmonary:  ?   Effort: Pulmonary effort is normal.  ?   Breath sounds: Normal breath sounds.  ?Musculoskeletal:  ?   Cervical back: Tenderness present.  ?Lymphadenopathy:  ?   Cervical: Cervical adenopathy present.  ?Skin: ?   General: Skin is warm and dry.  ?   Capillary Refill: Capillary refill takes less than 2 seconds.  ?Neurological:  ?   General: No focal deficit present.  ?  Mental Status: He is alert and oriented to person, place, and time.  ?Psychiatric:     ?   Mood and Affect: Mood normal.     ?   Behavior: Behavior normal.  ? ? ? ?ASSESSMENT & PLAN: ?Clinically stable.  No red flag signs or symptoms. ?Start antibiotics today, Z-Pak. ?Cough medication as needed. ?Advised to rest and stay well-hydrated. ?Advised to contact the office if no better or worse during the next several days. ? ?Problem List Items Addressed This Visit   ?None ?Visit Diagnoses   ? ? Acute pharyngitis, unspecified etiology    -  Primary  ? Relevant Medications  ? azithromycin (ZITHROMAX) 250 MG tablet  ? Acute cough      ? Relevant Medications  ? HYDROcodone bit-homatropine (HYCODAN) 5-1.5 MG/5ML syrup  ? ?  ? ?Patient Instructions  ?Pharyngitis ?Pharyngitis is a sore throat (pharynx). This is when there is redness, pain, and swelling in your throat. Most of the time, this condition gets better on its own. In some cases, you may need medicine. ?What are the causes? ?An infection from a virus. ?An infection from bacteria. ?Allergies. ?What increases the risk? ?Being 76-17 years old. ?Being in crowded environments. These include: ?Daycares. ?Schools. ?Dormitories. ?Living in a place with cold temperatures outside. ?Having a weakened  disease-fighting (immune) system. ?What are the signs or symptoms? ?Symptoms may vary depending on the cause. Common symptoms include: ?Sore throat. ?Tiredness (fatigue). ?Low-grade fever. ?Stuffy nose. ?Co

## 2022-01-19 ENCOUNTER — Ambulatory Visit: Payer: Managed Care, Other (non HMO) | Admitting: Internal Medicine

## 2022-01-22 ENCOUNTER — Telehealth: Payer: Self-pay | Admitting: *Deleted

## 2022-01-22 ENCOUNTER — Encounter: Payer: Self-pay | Admitting: Internal Medicine

## 2022-01-22 DIAGNOSIS — R051 Acute cough: Secondary | ICD-10-CM

## 2022-01-22 MED ORDER — HYDROCODONE BIT-HOMATROP MBR 5-1.5 MG/5ML PO SOLN: 5.0000 mL | Freq: Every evening | ORAL | 0 refills | Status: DC | PRN

## 2022-01-22 NOTE — Telephone Encounter (Signed)
Pa for hycodan submitted awaiting on reponse  ?Key: [id number] ?

## 2022-02-12 NOTE — Telephone Encounter (Signed)
PA approved Coverage End Date:04/22/2022 ?

## 2022-03-04 ENCOUNTER — Other Ambulatory Visit: Payer: Self-pay | Admitting: Internal Medicine

## 2022-03-04 NOTE — Telephone Encounter (Signed)
Please refill as per office routine med refill policy (all routine meds to be refilled for 3 mo or monthly (per pt preference) up to one year from last visit, then month to month grace period for 3 mo, then further med refills will have to be denied) ? ?

## 2022-03-13 ENCOUNTER — Other Ambulatory Visit: Payer: Self-pay | Admitting: Internal Medicine

## 2022-03-13 NOTE — Telephone Encounter (Signed)
Please refill as per office routine med refill policy (all routine meds to be refilled for 3 mo or monthly (per pt preference) up to one year from last visit, then month to month grace period for 3 mo, then further med refills will have to be denied) ? ?

## 2022-03-16 ENCOUNTER — Other Ambulatory Visit: Payer: Self-pay | Admitting: Internal Medicine

## 2022-03-16 NOTE — Telephone Encounter (Signed)
Please refill as per office routine med refill policy (all routine meds to be refilled for 3 mo or monthly (per pt preference) up to one year from last visit, then month to month grace period for 3 mo, then further med refills will have to be denied) ? ?

## 2022-03-24 ENCOUNTER — Ambulatory Visit: Payer: Managed Care, Other (non HMO) | Admitting: Internal Medicine

## 2022-03-24 ENCOUNTER — Encounter: Payer: Self-pay | Admitting: Internal Medicine

## 2022-03-24 VITALS — BP 110/60 | HR 76 | Temp 98.2°F | Ht 71.0 in | Wt 318.0 lb

## 2022-03-24 DIAGNOSIS — E559 Vitamin D deficiency, unspecified: Secondary | ICD-10-CM | POA: Diagnosis not present

## 2022-03-24 DIAGNOSIS — Z1211 Encounter for screening for malignant neoplasm of colon: Secondary | ICD-10-CM

## 2022-03-24 DIAGNOSIS — E538 Deficiency of other specified B group vitamins: Secondary | ICD-10-CM | POA: Diagnosis not present

## 2022-03-24 DIAGNOSIS — E1165 Type 2 diabetes mellitus with hyperglycemia: Secondary | ICD-10-CM | POA: Diagnosis not present

## 2022-03-24 DIAGNOSIS — I10 Essential (primary) hypertension: Secondary | ICD-10-CM

## 2022-03-24 DIAGNOSIS — M79674 Pain in right toe(s): Secondary | ICD-10-CM

## 2022-03-24 DIAGNOSIS — Z0001 Encounter for general adult medical examination with abnormal findings: Secondary | ICD-10-CM

## 2022-03-24 DIAGNOSIS — Z0000 Encounter for general adult medical examination without abnormal findings: Secondary | ICD-10-CM

## 2022-03-24 DIAGNOSIS — K429 Umbilical hernia without obstruction or gangrene: Secondary | ICD-10-CM | POA: Diagnosis not present

## 2022-03-24 MED ORDER — RYBELSUS 3 MG PO TABS: 3.0000 mg | ORAL_TABLET | Freq: Every day | ORAL | 3 refills | Status: DC

## 2022-03-24 MED ORDER — GLIPIZIDE ER 5 MG PO TB24: 5.0000 mg | ORAL_TABLET | Freq: Every day | ORAL | 3 refills | Status: DC

## 2022-03-24 NOTE — Patient Instructions (Signed)
Ok to decrease the glipizide xl to 5 mg per day  Please take all new medication as prescribed - the rybelsus 3 mg per day  Please continue all other medications as before, including the metformin  Please have the pharmacy call with any other refills you may need.  Please continue your efforts at being more active, low cholesterol diet, and weight control.  You are otherwise up to date with prevention measures today.  Please keep your appointments with your specialists as you may have planned  You will be contacted regarding the referral for: general surgury, podiatry and colonoscopy  Please go to the LAB at the blood drawing area for the tests to be done  You will be contacted by phone if any changes need to be made immediately.  Otherwise, you will receive a letter about your results with an explanation, but please check with MyChart first.  Please remember to sign up for MyChart if you have not done so, as this will be important to you in the future with finding out test results, communicating by private email, and scheduling acute appointments online when needed.  Please make an Appointment to return in 6 months, or sooner if needed

## 2022-03-24 NOTE — Progress Notes (Unsigned)
Patient ID: Martin Lozano, male   DOB: 1960/11/10, 61 y.o.   MRN: [id number]

## 2022-03-25 LAB — CBC WITH DIFFERENTIAL/PLATELET
Basophils Absolute: 0.1 10*3/uL (ref 0.0–0.1)
Basophils Relative: 1.3 % (ref 0.0–3.0)
Eosinophils Absolute: 0.3 10*3/uL (ref 0.0–0.7)
Eosinophils Relative: 2.6 % (ref 0.0–5.0)
HCT: 40.9 % (ref 39.0–52.0)
Hemoglobin: 13.6 g/dL (ref 13.0–17.0)
Lymphocytes Relative: 28.6 % (ref 12.0–46.0)
Lymphs Abs: 3 10*3/uL (ref 0.7–4.0)
MCHC: 33.3 g/dL (ref 30.0–36.0)
MCV: 87.6 fl (ref 78.0–100.0)
Monocytes Absolute: 0.6 10*3/uL (ref 0.1–1.0)
Monocytes Relative: 6 % (ref 3.0–12.0)
Neutro Abs: 6.3 10*3/uL (ref 1.4–7.7)
Neutrophils Relative %: 61.5 % (ref 43.0–77.0)
Platelets: 262 10*3/uL (ref 150.0–400.0)
RBC: 4.67 Mil/uL (ref 4.22–5.81)
RDW: 13.7 % (ref 11.5–15.5)
WBC: 10.3 10*3/uL (ref 4.0–10.5)

## 2022-03-25 LAB — URINALYSIS, ROUTINE W REFLEX MICROSCOPIC
Bilirubin Urine: NEGATIVE
Hgb urine dipstick: NEGATIVE
Ketones, ur: NEGATIVE
Leukocytes,Ua: NEGATIVE
Nitrite: NEGATIVE
RBC / HPF: NONE SEEN (ref 0–?)
Specific Gravity, Urine: 1.025 (ref 1.000–1.030)
Total Protein, Urine: NEGATIVE
Urine Glucose: 100 — AB
Urobilinogen, UA: 1 (ref 0.0–1.0)
pH: 5.5 (ref 5.0–8.0)

## 2022-03-25 LAB — MICROALBUMIN / CREATININE URINE RATIO
Creatinine,U: 144.4 mg/dL
Microalb Creat Ratio: 0.5 mg/g (ref 0.0–30.0)
Microalb, Ur: <0.7 mg/dL (ref 0.0–1.9)

## 2022-03-25 LAB — HEPATIC FUNCTION PANEL
ALT: 16 U/L (ref 0–53)
AST: 15 U/L (ref 0–37)
Albumin: 4.1 g/dL (ref 3.5–5.2)
Alkaline Phosphatase: 50 U/L (ref 39–117)
Bilirubin, Direct: 0.1 mg/dL (ref 0.0–0.3)
Total Bilirubin: 0.3 mg/dL (ref 0.2–1.2)
Total Protein: 7.1 g/dL (ref 6.0–8.3)

## 2022-03-25 LAB — PSA: PSA: 0.33 ng/mL (ref 0.10–4.00)

## 2022-03-25 LAB — LIPID PANEL
Cholesterol: 124 mg/dL (ref 0–200)
HDL: 28.4 mg/dL — ABNORMAL LOW (ref >39.00)
LDL Cholesterol: 57 mg/dL (ref 0–99)
NonHDL: 95.38
Total CHOL/HDL Ratio: 4
Triglycerides: 192 mg/dL — ABNORMAL HIGH (ref 0.0–149.0)
VLDL: 38.4 mg/dL (ref 0.0–40.0)

## 2022-03-25 LAB — BASIC METABOLIC PANEL
BUN: 17 mg/dL (ref 6–23)
CO2: 29 mEq/L (ref 19–32)
Calcium: 9.4 mg/dL (ref 8.4–10.5)
Chloride: 101 mEq/L (ref 96–112)
Creatinine, Ser: 1.11 mg/dL (ref 0.40–1.50)
GFR: 72.65 mL/min (ref >60.00)
Glucose, Bld: 105 mg/dL — ABNORMAL HIGH (ref 70–99)
Potassium: 4.7 mEq/L (ref 3.5–5.1)
Sodium: 136 mEq/L (ref 135–145)

## 2022-03-25 LAB — VITAMIN D 25 HYDROXY (VIT D DEFICIENCY, FRACTURES): VITD: 74.5 ng/mL (ref 30.00–100.00)

## 2022-03-25 LAB — HEMOGLOBIN A1C: Hgb A1c MFr Bld: 7.4 % — ABNORMAL HIGH (ref 4.6–6.5)

## 2022-03-25 LAB — TSH: TSH: 1.47 u[IU]/mL (ref 0.35–5.50)

## 2022-03-25 LAB — VITAMIN B12: Vitamin B-12: 752 pg/mL (ref 211–911)

## 2022-03-26 ENCOUNTER — Telehealth: Payer: Self-pay | Admitting: *Deleted

## 2022-03-28 ENCOUNTER — Encounter: Payer: Self-pay | Admitting: Internal Medicine

## 2022-03-28 DIAGNOSIS — M79674 Pain in right toe(s): Secondary | ICD-10-CM | POA: Insufficient documentation

## 2022-03-28 NOTE — Assessment & Plan Note (Signed)
Last vitamin D Lab Results  Component Value Date   VD25OH 74.50 03/24/2022   Stable, cont oral replacement

## 2022-03-28 NOTE — Assessment & Plan Note (Addendum)
Age and sex appropriate education and counseling updated with regular exercise and diet Referrals for preventative services - for colonoscopy Immunizations addressed - declines shingirx, tdap Smoking counseling  - none needed Evidence for depression or other mood disorder - none significant Most recent labs reviewed. I have personally reviewed and have noted: 1) the patient's medical and social history 2) The patient's current medications and supplements 3) The patient's height, weight, and BMI have been recorded in the chart

## 2022-03-28 NOTE — Assessment & Plan Note (Signed)
With small cyst and numb pain to 2nd toe right foot - for podiatry referral

## 2022-03-31 ENCOUNTER — Encounter: Payer: Self-pay | Admitting: Internal Medicine

## 2022-04-05 MED ORDER — METFORMIN HCL 500 MG PO TABS: ORAL_TABLET | ORAL | 3 refills | Status: DC

## 2022-04-08 ENCOUNTER — Encounter: Payer: Self-pay | Admitting: Podiatry

## 2022-04-08 ENCOUNTER — Ambulatory Visit: Payer: Managed Care, Other (non HMO) | Admitting: Podiatry

## 2022-04-08 ENCOUNTER — Ambulatory Visit (INDEPENDENT_AMBULATORY_CARE_PROVIDER_SITE_OTHER): Payer: Managed Care, Other (non HMO)

## 2022-04-08 DIAGNOSIS — M2041 Other hammer toe(s) (acquired), right foot: Secondary | ICD-10-CM

## 2022-04-08 DIAGNOSIS — M6740 Ganglion, unspecified site: Secondary | ICD-10-CM

## 2022-04-08 DIAGNOSIS — M778 Other enthesopathies, not elsewhere classified: Secondary | ICD-10-CM

## 2022-04-08 NOTE — Progress Notes (Signed)
Subjective:  Patient ID: Martin Lozano, male    DOB: 1961/03/14,  MRN: [id number] HPI Chief Complaint  Patient presents with   Toe Pain    2nd toe right - tiny bump x 4-6 months, wife popped it, but toe has still been aching, radiating into forefoot, also numbness plantar heel    Diabetes    Last a1c was 7.4   New Patient (Initial Visit)    61 y.o. male presents with the above complaint.   ROS: Denies fever chills nausea vomiting muscle aches pains calf pain back pain chest pain shortness of breath.  Past Medical History:  Diagnosis Date   ALLERGIC RHINITIS 09/06/2007   ASTHMATIC BRONCHITIS, ACUTE 11/16/2007   HEMORRHOIDS 07/03/2007   HYPERLIPIDEMIA 09/06/2007   HYPERTENSION 07/03/2007   KNEE SPRAIN, LEFT 07/30/2008   OBESITY, MORBID 07/03/2007   Other tenosynovitis of hand and wrist 06/11/2010   OTITIS MEDIA, ACUTE, LEFT 11/10/2009   Type II or unspecified type diabetes mellitus without mention of complication, uncontrolled 09/25/2012   Wheezing 09/09/2009   Past Surgical History:  Procedure Laterality Date   EXTRACORPOREAL SHOCK WAVE LITHOTRIPSY Left 07/28/2020   Procedure: LEFT EXTRACORPOREAL SHOCK WAVE LITHOTRIPSY (ESWL);  Surgeon: Jerilee Field, MD;  Location: [facility];  Service: Urology;  Laterality: Left;   WISDOM TOOTH EXTRACTION      Current Outpatient Medications:    aspirin 81 MG tablet, Take 81 mg by mouth daily., Disp: , Rfl:    Cholecalciferol (THERA-D 2000) 50 MCG (2000 UT) TABS, 1 tab by mouth once daily, Disp: 30 tablet, Rfl: 99   CVS VITAMIN B12 1000 MCG tablet, TAKE 1 TABLET BY MOUTH EVERY DAY, Disp: 200 tablet, Rfl: 3   glipiZIDE (GLIPIZIDE XL) 5 MG 24 hr tablet, Take 1 tablet (5 mg total) by mouth daily with breakfast., Disp: 90 tablet, Rfl: 3   lisinopril (ZESTRIL) 20 MG tablet, TAKE 1 TABLET BY MOUTH EVERY DAY, Disp: 90 tablet, Rfl: 1   metFORMIN (GLUCOPHAGE) 500 MG tablet, TAKE 2 TABLETS BY MOUTH AM AND 2 TABLETS IN THE EVENING, Disp:  360 tablet, Rfl: 3   rosuvastatin (CRESTOR) 20 MG tablet, Take 1 tablet (20 mg total) by mouth daily. Overdue for Annual appt w/labs  must see provider for future refills, Disp: 30 tablet, Rfl: 0   Semaglutide (RYBELSUS) 3 MG TABS, Take 3 mg by mouth daily., Disp: 90 tablet, Rfl: 3  No Known Allergies Review of Systems Objective:  There were no vitals filed for this visit.  General: Well developed, nourished, in no acute distress, alert and oriented x3   Dermatological: Skin is warm, dry and supple bilateral. Nails x 10 are well maintained; remaining integument appears unremarkable at this time. There are no open sores, no preulcerative lesions, no rash or signs of infection present.  Mucoid cyst dorsal medial aspect second digit right foot  Vascular: Dorsalis Pedis artery and Posterior Tibial artery pedal pulses are 2/4 bilateral with immedate capillary fill time. Pedal hair growth present. No varicosities and no lower extremity edema present bilateral.   Neruologic: Grossly intact via light touch bilateral. Vibratory intact via tuning fork bilateral. Protective threshold with Semmes Wienstein monofilament intact to all pedal sites bilateral. Patellar and Achilles deep tendon reflexes 2+ bilateral. No Babinski or clonus noted bilateral.  Numbness with occasional radiating pain along the medial heel consistent with Baxters neuritis  Musculoskeletal: No gross boney pedal deformities bilateral. No pain, crepitus, or limitation noted with foot and ankle range of motion  bilateral. Muscular strength 5/5 in all groups tested bilateral.  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    Radiographs:  Radiographs taken today demonstrate an osseously mature individual osteoarthritic change at the DIPJ of the second digit of the right foot otherwise no significant osseous abnormalities identified.  No acute findings.  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  Baxters neuritis of the right foot.  Plan: Discussed etiology pathology conservative versus surgical therapies.  He will follow-up with [place] as needed     Romelia Bromell T. [place], [place]

## 2022-04-19 ENCOUNTER — Other Ambulatory Visit: Payer: Self-pay | Admitting: Internal Medicine

## 2022-04-19 ENCOUNTER — Encounter: Payer: Self-pay | Admitting: Internal Medicine

## 2022-04-19 NOTE — Telephone Encounter (Signed)
Please refill as per office routine med refill policy (all routine meds to be refilled for 3 mo or monthly (per pt preference) up to one year from last visit, then month to month grace period for 3 mo, then further med refills will have to be denied) ? ?

## 2022-04-26 ENCOUNTER — Other Ambulatory Visit: Payer: Self-pay | Admitting: Internal Medicine

## 2022-06-21 ENCOUNTER — Ambulatory Visit (AMBULATORY_SURGERY_CENTER): Payer: Managed Care, Other (non HMO)

## 2022-06-21 VITALS — Ht 71.0 in | Wt 317.0 lb

## 2022-06-21 DIAGNOSIS — Z1211 Encounter for screening for malignant neoplasm of colon: Secondary | ICD-10-CM

## 2022-06-21 MED ORDER — PEG 3350-KCL-NA BICARB-NACL 420 G PO SOLR: 4000.0000 mL | Freq: Once | ORAL | 0 refills | Status: AC

## 2022-06-21 NOTE — Progress Notes (Signed)
No egg or soy allergy known to patient  No issues known to pt with past sedation with any surgeries or procedures Patient denies ever being told they had issues or difficulty with intubation  No FH of Malignant Hyperthermia Pt is not on diet pills Pt is not on home 02  Pt is not on blood thinners  Pt denies issues with constipation  No A fib or A flutter Have any cardiac testing pending--NO Pt instructed to use Singlecare.[email or web address] or GoodRx for a price reduction on prep   

## 2022-06-28 ENCOUNTER — Encounter: Payer: Managed Care, Other (non HMO) | Admitting: Gastroenterology

## 2022-07-05 ENCOUNTER — Encounter: Payer: Self-pay | Admitting: Internal Medicine

## 2022-07-19 ENCOUNTER — Encounter: Payer: Managed Care, Other (non HMO) | Admitting: Internal Medicine

## 2022-07-21 ENCOUNTER — Ambulatory Visit: Payer: Managed Care, Other (non HMO) | Admitting: Family Medicine

## 2022-07-21 ENCOUNTER — Encounter: Payer: Self-pay | Admitting: Family Medicine

## 2022-07-21 VITALS — BP 114/66 | HR 114 | Temp 99.7°F | Ht 71.0 in | Wt 313.0 lb

## 2022-07-21 DIAGNOSIS — R0981 Nasal congestion: Secondary | ICD-10-CM | POA: Diagnosis not present

## 2022-07-21 DIAGNOSIS — U071 COVID-19: Secondary | ICD-10-CM

## 2022-07-21 DIAGNOSIS — R051 Acute cough: Secondary | ICD-10-CM

## 2022-07-21 DIAGNOSIS — R52 Pain, unspecified: Secondary | ICD-10-CM | POA: Diagnosis not present

## 2022-07-21 LAB — POC COVID19 BINAXNOW: SARS Coronavirus 2 Ag: POSITIVE — AB

## 2022-07-21 MED ORDER — MOLNUPIRAVIR 200 MG PO CAPS: 4.0000 | ORAL_CAPSULE | Freq: Two times a day (BID) | ORAL | 0 refills | Status: AC

## 2022-07-21 MED ORDER — HYDROCODONE BIT-HOMATROP MBR 5-1.5 MG/5ML PO SOLN: 5.0000 mL | Freq: Three times a day (TID) | ORAL | 0 refills | Status: DC | PRN

## 2022-07-21 NOTE — Progress Notes (Signed)
Subjective: Chief Complaint  Patient presents with   Nasal Congestion   Cough    Productive cough with green sputum started Sunday night   Generalized Body Aches    Started sunday   Fever    101-101.4 yesterday     Martin Lozano is a 61 y.o. male who presents for a 3 day hx of fever, body aches, productive cough.   Denies dizziness, chest pain, palpitations, shortness of breath, abdominal pain, N/V/D.  No other aggravating or relieving factors.  No other c/o.  ROS as in subjective.   Objective: Vitals:   07/21/22 0806  BP: 114/66  Pulse: (!) 114  Temp: 99.7 F (37.6 C)  SpO2: 99%    General appearance: Alert, WD/WN, no distress, mildly ill appearing Respirations unlabored. Speaking in complete sentences without difficulty. Normal mood. Normal gait.                                    Assessment: COVID-19 virus infection - Plan: molnupiravir EUA (LAGEVRIO) 200 MG CAPS capsule  Body aches - Plan: POC COVID-19  Acute cough - Plan: POC COVID-19, HYDROcodone bit-homatropine (HYCODAN) 5-1.5 MG/5ML syrup  Nasal congestion - Plan: POC COVID-19   Plan: COVID test is positive He is not in any acute distress. Discussed diagnosis and treatment of Covid infection.  Molnupiravir prescribed.   Hycodan prescribed for cough per patient request.  He has done well with this in the past.  Suggested symptomatic OTC remedies. Tylenol OTC for fever and malaise.   Counseling on CDC guidelines for quarantine and isolation.

## 2022-07-21 NOTE — Patient Instructions (Signed)
You tested positive for COVID today.  I prescribed molnupiravir which is the antiviral medication.  Start taking this today.  Rest, stay hydrated and treat your symptoms.  Take Tylenol as needed.  You may take Mucinex for congestion and cough.  I also prescribed a cough syrup which contains the narcotic hydrocodone.  Do not take this medication and drive.  It is sedating.  Let us know if you are getting significantly worse.  Be aware that the cough can linger for several days

## 2022-07-29 ENCOUNTER — Encounter: Payer: Self-pay | Admitting: Internal Medicine

## 2022-07-29 DIAGNOSIS — R051 Acute cough: Secondary | ICD-10-CM

## 2022-07-30 MED ORDER — HYDROCODONE BIT-HOMATROP MBR 5-1.5 MG/5ML PO SOLN: 5.0000 mL | Freq: Three times a day (TID) | ORAL | 0 refills | Status: DC | PRN

## 2022-07-30 NOTE — Telephone Encounter (Signed)
Seen on 10/18, patient requesting refill if appropriate.

## 2022-08-05 ENCOUNTER — Ambulatory Visit: Payer: Managed Care, Other (non HMO) | Admitting: Internal Medicine

## 2022-08-05 ENCOUNTER — Encounter: Payer: Self-pay | Admitting: Internal Medicine

## 2022-08-05 VITALS — BP 122/64 | HR 89 | Temp 98.5°F | Ht 71.0 in | Wt 313.0 lb

## 2022-08-05 DIAGNOSIS — E1165 Type 2 diabetes mellitus with hyperglycemia: Secondary | ICD-10-CM

## 2022-08-05 DIAGNOSIS — E7800 Pure hypercholesterolemia, unspecified: Secondary | ICD-10-CM | POA: Diagnosis not present

## 2022-08-05 DIAGNOSIS — E559 Vitamin D deficiency, unspecified: Secondary | ICD-10-CM

## 2022-08-05 DIAGNOSIS — I10 Essential (primary) hypertension: Secondary | ICD-10-CM | POA: Diagnosis not present

## 2022-08-05 DIAGNOSIS — U071 COVID-19: Secondary | ICD-10-CM | POA: Diagnosis not present

## 2022-08-05 MED ORDER — RYBELSUS 14 MG PO TABS: 14.0000 mg | ORAL_TABLET | Freq: Every day | ORAL | 3 refills | Status: DC

## 2022-08-05 NOTE — Progress Notes (Signed)
Patient ID: Martin Lozano, male   DOB: [birth year], 61 y.o.   MRN: [id number]        Chief Complaint: follow up recent covid infection       HPI:  Martin Lozano is a 61 y.o. male here with c/o above x 3 wks, tx with molnupiravir, and overall much improved except for residual diarrhea, may be starting to get better today.  Pt denies chest pain, increased sob or doe, wheezing, orthopnea, PND, increased LE swelling, palpitations, dizziness or syncope.   Pt denies polydipsia, polyuria, or new focal neuro s/s.    Pt denies fever, wt loss, night sweats, loss of appetite, or other constitutional symptoms  Unable to lose significant wt with diet, exercise.         Wt Readings from Last 3 Encounters:  08/05/22 (!) 313 lb (142 kg)  07/21/22 (!) 313 lb (142 kg)  06/21/22 (!) 317 lb (143.8 kg)   BP Readings from Last 3 Encounters:  08/05/22 122/64  07/21/22 114/66  03/24/22 110/60         Past Medical History:  Diagnosis Date   ALLERGIC RHINITIS 09/06/2007   ASTHMATIC BRONCHITIS, ACUTE 11/16/2007   HEMORRHOIDS 07/03/2007   HYPERLIPIDEMIA 09/06/2007   on meds   HYPERTENSION 07/03/2007   on meds   KNEE SPRAIN, LEFT 07/30/2008   OBESITY, MORBID 07/03/2007   Other tenosynovitis of hand and wrist 06/11/2010   OTITIS MEDIA, ACUTE, LEFT 11/10/2009   Type II or unspecified type diabetes mellitus without mention of complication, uncontrolled 09/25/2012   on meds   Umbilical hernia    Wheezing 09/09/2009   Past Surgical History:  Procedure Laterality Date   EXTRACORPOREAL SHOCK WAVE LITHOTRIPSY Left 07/28/2020   Procedure: LEFT EXTRACORPOREAL SHOCK WAVE LITHOTRIPSY (ESWL);  Surgeon: Jerilee Field, MD;  [facility];  Service: Urology;  Laterality: Left;   WISDOM TOOTH EXTRACTION      reports that he has never smoked. He has never used smokeless tobacco. He reports that he does not currently use alcohol. He reports that he does not use drugs. family history includes  Colon polyps (age of onset: 9) in his brother. No Known Allergies Current Outpatient Medications on File Prior to Visit  Medication Sig Dispense Refill   aspirin 81 MG tablet Take 81 mg by mouth daily.     Cholecalciferol (THERA-D 2000) 50 MCG (2000 UT) TABS 1 tab by mouth once daily 30 tablet 99   CVS VITAMIN B12 1000 MCG tablet TAKE 1 TABLET BY MOUTH EVERY DAY 100 tablet 1   glipiZIDE (GLUCOTROL XL) 10 MG 24 hr tablet Take 10 mg by mouth every morning.     HYDROcodone bit-homatropine (HYCODAN) 5-1.5 MG/5ML syrup Take 5 mLs by mouth every 8 (eight) hours as needed for cough. 180 mL 0   lisinopril (ZESTRIL) 20 MG tablet TAKE 1 TABLET BY MOUTH EVERY DAY 90 tablet 1   metFORMIN (GLUCOPHAGE) 500 MG tablet TAKE 2 TABLETS BY MOUTH AM AND 2 TABLETS IN THE EVENING 360 tablet 3   rosuvastatin (CRESTOR) 20 MG tablet TAKE ONE TABLET DAILY 90 tablet 3   No current facility-administered medications on file prior to visit.        ROS:  All others reviewed and negative.  Objective        PE:  BP 122/64 (BP Location: Right Arm, Patient Position: Sitting, Cuff Size: Large)   Pulse 89   Temp 98.5 F (36.9 C) (Oral)   Ht  5\' 11"  (1.803 m)   Wt (!) 313 lb (142 kg)   SpO2 95%   BMI 43.65 kg/m                 Constitutional: Pt appears in NAD               HENT: Head: NCAT.                Right Ear: External ear normal.                 Left Ear: External ear normal.                Eyes: . Pupils are equal, round, and reactive to light. Conjunctivae and EOM are normal               Nose: without d/c or deformity               Neck: Neck supple. Gross normal ROM               Cardiovascular: Normal rate and regular rhythm.                 Pulmonary/Chest: Effort normal and breath sounds without rales or wheezing.                Abd:  Soft, NT, ND, + BS, no organomegaly               Neurological: Pt is alert. At baseline orientation, motor grossly intact               Skin: Skin is warm. No rashes,  no other new lesions, LE edema - none               Psychiatric: Pt behavior is normal without agitation   Micro: none  Cardiac tracings I have personally interpreted today:  none  Pertinent Radiological findings (summarize): none   Lab Results  Component Value Date   WBC 10.3 03/24/2022   HGB 13.6 03/24/2022   HCT 40.9 03/24/2022   PLT 262.0 03/24/2022   GLUCOSE 105 (H) 03/24/2022   CHOL 124 03/24/2022   TRIG 192.0 (H) 03/24/2022   HDL 28.40 (L) 03/24/2022   LDLDIRECT 88.0 11/02/2018   LDLCALC 57 03/24/2022   ALT 16 03/24/2022   AST 15 03/24/2022   NA 136 03/24/2022   K 4.7 03/24/2022   CL 101 03/24/2022   CREATININE 1.11 03/24/2022   BUN 17 03/24/2022   CO2 29 03/24/2022   TSH 1.47 03/24/2022   PSA 0.33 03/24/2022   HGBA1C 7.4 (H) 03/24/2022   MICROALBUR <0.7 03/24/2022   Assessment/Plan:  Martin Lozano is a 61 y.o. White or Caucasian [1] male with  has a past medical history of ALLERGIC RHINITIS (09/06/2007), ASTHMATIC BRONCHITIS, ACUTE (11/16/2007), HEMORRHOIDS (07/03/2007), HYPERLIPIDEMIA (09/06/2007), HYPERTENSION (07/03/2007), KNEE SPRAIN, LEFT (07/30/2008), OBESITY, MORBID (07/03/2007), Other tenosynovitis of hand and wrist (06/11/2010), OTITIS MEDIA, ACUTE, LEFT (11/10/2009), Type II or unspecified type diabetes mellitus without mention of complication, uncontrolled (00/86/7619), Umbilical hernia, and Wheezing (09/09/2009).  Diabetes ([place]) Lab Results  Component Value Date   HGBA1C 7.4 (H) 03/24/2022   Uncontrolled in the setting of morbid obesity, pt to increase rybelsus 14 mg qd for improved wt control and sugar   Essential hypertension BP Readings from Last 3 Encounters:  08/05/22 122/64  07/21/22 114/66  03/24/22 110/60   Stable, pt to continue medical treatment lisionpril 20 mg qd   Hyperlipidemia  Lab Results  Component Value Date   LDLCALC 57 03/24/2022   Stable, pt to continue current statin crestor 20 mg qd   Vitamin D deficiency Last  vitamin D Lab Results  Component Value Date   VD25OH 74.50 03/24/2022   Stable, cont oral replacement   COVID-19 virus infection Recent now resolved,  to f/u any worsening symptoms or concerns  Followup: Return in about 4 months (around 12/04/2022).  Oliver Barre, MD 08/05/2022 7:49 PM [facility]

## 2022-08-05 NOTE — Assessment & Plan Note (Signed)
Lab Results  Component Value Date   LDLCALC 57 03/24/2022   Stable, pt to continue current statin crestor 20 mg qd

## 2022-08-05 NOTE — Assessment & Plan Note (Signed)
Recent now resolved,  to f/u any worsening symptoms or concerns

## 2022-08-05 NOTE — Patient Instructions (Addendum)
Ok to increase the rybelsus to 14 mg for better weight loss and sugar  Please continue all other medications as before, and refills have been done if requested.  Please have the pharmacy call with any other refills you may need.  Please continue your efforts at being more active, low cholesterol diet, and weight control.  Please keep your appointments with your specialists as you may have planned  Please make an Appointment to return in 4 months, or sooner if needed

## 2022-08-05 NOTE — Assessment & Plan Note (Signed)
BP Readings from Last 3 Encounters:  08/05/22 122/64  07/21/22 114/66  03/24/22 110/60   Stable, pt to continue medical treatment lisionpril 20 mg qd

## 2022-08-05 NOTE — Assessment & Plan Note (Signed)
Last vitamin D Lab Results  Component Value Date   VD25OH 74.50 03/24/2022   Stable, cont oral replacement

## 2022-08-05 NOTE — Assessment & Plan Note (Signed)
Lab Results  Component Value Date   HGBA1C 7.4 (H) 03/24/2022   Uncontrolled in the setting of morbid obesity, pt to increase rybelsus 14 mg qd for improved wt control and sugar

## 2022-09-29 ENCOUNTER — Ambulatory Visit: Payer: Managed Care, Other (non HMO) | Admitting: Internal Medicine

## 2022-09-30 ENCOUNTER — Encounter: Payer: Self-pay | Admitting: Internal Medicine

## 2022-09-30 ENCOUNTER — Ambulatory Visit: Payer: Managed Care, Other (non HMO) | Admitting: Internal Medicine

## 2022-09-30 VITALS — BP 130/80 | HR 94 | Temp 98.3°F | Ht 71.0 in | Wt 321.0 lb

## 2022-09-30 DIAGNOSIS — R051 Acute cough: Secondary | ICD-10-CM | POA: Diagnosis not present

## 2022-09-30 DIAGNOSIS — I10 Essential (primary) hypertension: Secondary | ICD-10-CM | POA: Diagnosis not present

## 2022-09-30 DIAGNOSIS — R059 Cough, unspecified: Secondary | ICD-10-CM | POA: Insufficient documentation

## 2022-09-30 DIAGNOSIS — E1165 Type 2 diabetes mellitus with hyperglycemia: Secondary | ICD-10-CM | POA: Diagnosis not present

## 2022-09-30 LAB — POCT INFLUENZA A/B
Influenza A, POC: NEGATIVE
Influenza B, POC: NEGATIVE

## 2022-09-30 LAB — POC COVID19 BINAXNOW: SARS Coronavirus 2 Ag: NEGATIVE

## 2022-09-30 MED ORDER — HYDROCODONE BIT-HOMATROP MBR 5-1.5 MG/5ML PO SOLN: 5.0000 mL | Freq: Three times a day (TID) | ORAL | 0 refills | Status: DC | PRN

## 2022-09-30 MED ORDER — DOXYCYCLINE HYCLATE 100 MG PO TABS: 100.0000 mg | ORAL_TABLET | Freq: Two times a day (BID) | ORAL | 0 refills | Status: DC

## 2022-09-30 NOTE — Progress Notes (Signed)
Patient ID: Martin Lozano, male   DOB: 1960/11/22, 61 y.o.   MRN: [id number]        Chief Complaint: follow up acute cough, DM, HTN       HPI:  Martin Lozano is a 61 y.o. male Here with acute onset mild to mod 2-3 days ST, HA, general weakness and malaise, with prod cough greenish sputum, but Pt denies chest pain, increased sob or doe, wheezing, orthopnea, PND, increased LE swelling, palpitations, dizziness or syncope.  No sick contacts.   Pt denies polydipsia, polyuria, or new focal neuro s/s.    Pt denies recent wt loss, night sweats, loss of appetite, or other constitutional symptoms       Wt Readings from Last 3 Encounters:  09/30/22 (!) 321 lb (145.6 kg)  08/05/22 (!) 313 lb (142 kg)  07/21/22 (!) 313 lb (142 kg)   BP Readings from Last 3 Encounters:  09/30/22 130/80  08/05/22 122/64  07/21/22 114/66         Past Medical History:  Diagnosis Date   ALLERGIC RHINITIS 09/06/2007   ASTHMATIC BRONCHITIS, ACUTE 11/16/2007   HEMORRHOIDS 07/03/2007   HYPERLIPIDEMIA 09/06/2007   on meds   HYPERTENSION 07/03/2007   on meds   KNEE SPRAIN, LEFT 07/30/2008   OBESITY, MORBID 07/03/2007   Other tenosynovitis of hand and wrist 06/11/2010   OTITIS MEDIA, ACUTE, LEFT 11/10/2009   Type II or unspecified type diabetes mellitus without mention of complication, uncontrolled 09/25/2012   on meds   Umbilical hernia    Wheezing 09/09/2009   Past Surgical History:  Procedure Laterality Date   EXTRACORPOREAL SHOCK WAVE LITHOTRIPSY Left 07/28/2020   Procedure: LEFT EXTRACORPOREAL SHOCK WAVE LITHOTRIPSY (ESWL);  Surgeon: Jerilee Field, MD;  Location: [facility];  Service: Urology;  Laterality: Left;   WISDOM TOOTH EXTRACTION      reports that he has never smoked. He has never used smokeless tobacco. He reports that he does not currently use alcohol. He reports that he does not use drugs. family history includes Colon polyps (age of onset: 52) in his brother. No Known  Allergies Current Outpatient Medications on File Prior to Visit  Medication Sig Dispense Refill   aspirin 81 MG tablet Take 81 mg by mouth daily.     Cholecalciferol (THERA-D 2000) 50 MCG (2000 UT) TABS 1 tab by mouth once daily 30 tablet 99   [facility] VITAMIN B12 1000 MCG tablet TAKE 1 TABLET BY MOUTH EVERY DAY 100 tablet 1   glipiZIDE (GLUCOTROL XL) 10 MG 24 hr tablet Take 10 mg by mouth every morning.     lisinopril (ZESTRIL) 20 MG tablet TAKE 1 TABLET BY MOUTH EVERY DAY 90 tablet 1   metFORMIN (GLUCOPHAGE) 500 MG tablet TAKE 2 TABLETS BY MOUTH AM AND 2 TABLETS IN THE EVENING 360 tablet 3   rosuvastatin (CRESTOR) 20 MG tablet TAKE ONE TABLET DAILY 90 tablet 3   Semaglutide (RYBELSUS) 14 MG TABS Take 1 tablet (14 mg total) by mouth daily. 90 tablet 3   No current facility-administered medications on file prior to visit.        ROS:  All others reviewed and negative.  Objective        PE:  BP 130/80 (BP Location: Right Arm, Patient Position: Sitting, Cuff Size: Normal)   Pulse 94   Temp 98.3 F (36.8 C) (Oral)   Ht 5\' 11"  (1.803 m)   Wt (!) 321 lb (145.6 kg)   SpO2 96%  BMI 44.77 kg/m                 Constitutional: Pt appears in NAD               HENT: Head: NCAT.                Right Ear: External ear normal.                 Left Ear: External ear normal. Bilat tm's with mild erythema.  Max sinus areas non tender.  Pharynx with mild erythema, no exudate               Eyes: . Pupils are equal, round, and reactive to light. Conjunctivae and EOM are normal               Nose: without d/c or deformity               Neck: Neck supple. Gross normal ROM               Cardiovascular: Normal rate and regular rhythm.                 Pulmonary/Chest: Effort normal and breath sounds without rales or wheezing.                               Neurological: Pt is alert. At baseline orientation, motor grossly intact               Skin: Skin is warm. No rashes, no other new lesions, LE edema -  none               Psychiatric: Pt behavior is normal without agitation   Micro: none  Cardiac tracings I have personally interpreted today:  none  Pertinent Radiological findings (summarize): none   Lab Results  Component Value Date   WBC 10.3 03/24/2022   HGB 13.6 03/24/2022   HCT 40.9 03/24/2022   PLT 262.0 03/24/2022   GLUCOSE 105 (H) 03/24/2022   CHOL 124 03/24/2022   TRIG 192.0 (H) 03/24/2022   HDL 28.40 (L) 03/24/2022   LDLDIRECT 88.0 11/02/2018   LDLCALC 57 03/24/2022   ALT 16 03/24/2022   AST 15 03/24/2022   NA 136 03/24/2022   K 4.7 03/24/2022   CL 101 03/24/2022   CREATININE 1.11 03/24/2022   BUN 17 03/24/2022   CO2 29 03/24/2022   TSH 1.47 03/24/2022   PSA 0.33 03/24/2022   HGBA1C 7.4 (H) 03/24/2022   MICROALBUR <0.7 03/24/2022   POCT - COVID - neg, RSV - neg, Flu A/B - neg  Assessment/Plan:  Martin Lozano is a 61 y.o. White or Caucasian [1] male with  has a past medical history of ALLERGIC RHINITIS (09/06/2007), ASTHMATIC BRONCHITIS, ACUTE (11/16/2007), HEMORRHOIDS (07/03/2007), HYPERLIPIDEMIA (09/06/2007), HYPERTENSION (07/03/2007), KNEE SPRAIN, LEFT (07/30/2008), OBESITY, MORBID (07/03/2007), Other tenosynovitis of hand and wrist (06/11/2010), OTITIS MEDIA, ACUTE, LEFT (11/10/2009), Type II or unspecified type diabetes mellitus without mention of complication, uncontrolled (09/25/2012), Umbilical hernia, and Wheezing (09/09/2009).  Cough Mild to mod, c/w bronchitis vs pna, declines cxr, for antibx course, cough med prn,  to f/u any worsening symptoms or concerns   Diabetes (HCC) Lab Results  Component Value Date   HGBA1C 7.4 (H) 03/24/2022   Uncontrolled, pt to continue current medical treatment glucotrol xl 10 mg qd, metformine 500 mg - 2 tab bid, rybelsus 14 mg and f/u  a1c   Essential hypertension BP Readings from Last 3 Encounters:  09/30/22 130/80  08/05/22 122/64  07/21/22 114/66   Stable, pt to continue medical treatment lisinopril 20 mg  qd   Followup: Return if symptoms worsen or fail to improve.  Oliver Barre, MD 09/30/2022 4:49 PM [facility]

## 2022-09-30 NOTE — Assessment & Plan Note (Signed)
Mild to mod, c/w bronchitis vs pna, declines cxr, for antibx course, cough med prn,  to f/u any worsening symptoms or concerns 

## 2022-09-30 NOTE — Addendum Note (Signed)
Addended by: Levonne Lapping on: 09/30/2022 04:51 PM   Modules accepted: Orders

## 2022-09-30 NOTE — Patient Instructions (Addendum)
Your COVID, Flu and RSV testing was negative  Please take all new medication as prescribed - the antibiotic and cough medicine as needed  Please continue all other medications as before, and refills have been done if requested.  Please have the pharmacy call with any other refills you may need.  Please continue your efforts at being more active, low cholesterol diet, and weight control.  Please keep your appointments with your specialists as you may have planned

## 2022-09-30 NOTE — Assessment & Plan Note (Signed)
Lab Results  Component Value Date   HGBA1C 7.4 (H) 03/24/2022   Uncontrolled, pt to continue current medical treatment glucotrol xl 10 mg qd, metformine 500 mg - 2 tab bid, rybelsus 14 mg and f/u a1c

## 2022-09-30 NOTE — Assessment & Plan Note (Signed)
BP Readings from Last 3 Encounters:  09/30/22 130/80  08/05/22 122/64  07/21/22 114/66   Stable, pt to continue medical treatment lisinopril 20 mg qd

## 2022-10-01 ENCOUNTER — Telehealth: Payer: Self-pay

## 2022-10-01 DIAGNOSIS — R051 Acute cough: Secondary | ICD-10-CM

## 2022-10-01 MED ORDER — HYDROCODONE BIT-HOMATROP MBR 5-1.5 MG/5ML PO SOLN: 5.0000 mL | Freq: Three times a day (TID) | ORAL | 0 refills | Status: DC | PRN

## 2022-10-01 NOTE — Telephone Encounter (Signed)
Spoke with the pt and was able to confirm he has picked [facility].

## 2022-10-01 NOTE — Telephone Encounter (Signed)
Ok this is done 

## 2022-10-01 NOTE — Telephone Encounter (Signed)
Pt states [facility] cough medicine HYDROcodone bit-homatropine (HYCODAN) 5-1.5 MG/5ML syrup  was sent to is out. He has stated the [facility] [place] [facility] it in stock. Please send to the [place] pharmacy.

## 2022-10-01 NOTE — Addendum Note (Signed)
Addended by: [name] on: 10/01/2022 11:49 AM   Modules accepted: Orders

## 2022-10-15 ENCOUNTER — Ambulatory Visit (INDEPENDENT_AMBULATORY_CARE_PROVIDER_SITE_OTHER): Payer: Managed Care, Other (non HMO)

## 2022-10-15 ENCOUNTER — Encounter: Payer: Self-pay | Admitting: Family Medicine

## 2022-10-15 ENCOUNTER — Ambulatory Visit: Payer: Managed Care, Other (non HMO) | Admitting: Family Medicine

## 2022-10-15 VITALS — BP 130/84 | HR 92 | Temp 97.7°F | Ht 71.0 in | Wt 308.0 lb

## 2022-10-15 DIAGNOSIS — R062 Wheezing: Secondary | ICD-10-CM

## 2022-10-15 DIAGNOSIS — R509 Fever, unspecified: Secondary | ICD-10-CM

## 2022-10-15 DIAGNOSIS — J22 Unspecified acute lower respiratory infection: Secondary | ICD-10-CM

## 2022-10-15 DIAGNOSIS — R630 Anorexia: Secondary | ICD-10-CM

## 2022-10-15 LAB — CBC WITH DIFFERENTIAL/PLATELET
Basophils Absolute: 0.1 10*3/uL (ref 0.0–0.1)
Basophils Relative: 0.8 % (ref 0.0–3.0)
Eosinophils Absolute: 0.3 10*3/uL (ref 0.0–0.7)
Eosinophils Relative: 3.3 % (ref 0.0–5.0)
HCT: 43.8 % (ref 39.0–52.0)
Hemoglobin: 14.7 g/dL (ref 13.0–17.0)
Lymphocytes Relative: 30.5 % (ref 12.0–46.0)
Lymphs Abs: 2.3 10*3/uL (ref 0.7–4.0)
MCHC: 33.7 g/dL (ref 30.0–36.0)
MCV: 85.9 fl (ref 78.0–100.0)
Monocytes Absolute: 0.6 10*3/uL (ref 0.1–1.0)
Monocytes Relative: 7.7 % (ref 3.0–12.0)
Neutro Abs: 4.4 10*3/uL (ref 1.4–7.7)
Neutrophils Relative %: 57.7 % (ref 43.0–77.0)
Platelets: 284 10*3/uL (ref 150.0–400.0)
RBC: 5.1 Mil/uL (ref 4.22–5.81)
RDW: 14.3 % (ref 11.5–15.5)
WBC: 7.6 10*3/uL (ref 4.0–10.5)

## 2022-10-15 MED ORDER — BENZONATATE 200 MG PO CAPS: 200.0000 mg | ORAL_CAPSULE | Freq: Two times a day (BID) | ORAL | 0 refills | Status: DC | PRN

## 2022-10-15 MED ORDER — ALBUTEROL SULFATE HFA 108 (90 BASE) MCG/ACT IN AERS: 2.0000 | INHALATION_SPRAY | Freq: Four times a day (QID) | RESPIRATORY_TRACT | 0 refills | Status: DC | PRN

## 2022-10-15 NOTE — Progress Notes (Signed)
Subjective:  Martin Lozano is a 62 y.o. male who presents for cough and fever.  Nausea and mild constipation.   States he has lost his taste and eating less x 3 weeks. Lost weight.   Denies smoking. Hx of pneumonia. No asthma or COPD  He completed a course of Doxycycline. He has also been taking Hycodan nightly for the past 3 weeks.  Negative Covid, RSV 3 wks ago  Denies dizziness, chest pain, palpitations, shortness of breath, abdominal pain, vomiting or diarrhea.    No other aggravating or relieving factors.  No other c/o.  ROS as in subjective.   Objective: Vitals:   10/15/22 1453  BP: 130/84  Pulse: 92  Temp: 97.7 F (36.5 C)  SpO2: 98%    General appearance: Alert, WD/WN, no distress, mildly ill appearing                             Skin: warm, no rash                           Head: no sinus tenderness                            Eyes: conjunctiva normal, corneas clear, PERRLA                                             Neck: supple, no adenopathy, no thyromegaly, nontender                          Heart: RRR                         Lungs: expiratory wheezes, no rales, or rhonchi      Assessment: Lower respiratory infection - Plan: DG Chest 2 View, CBC with Differential/Platelet, benzonatate (TESSALON) 200 MG capsule, albuterol (VENTOLIN HFA) 108 (90 Base) MCG/ACT inhaler, CBC with Differential/Platelet  Wheezing - Plan: DG Chest 2 View, CBC with Differential/Platelet, albuterol (VENTOLIN HFA) 108 (90 Base) MCG/ACT inhaler, CBC with Differential/Platelet  Fever, unspecified fever cause - Plan: DG Chest 2 View, CBC with Differential/Platelet, CBC with Differential/Platelet  Poor appetite   Plan: Stat chest x-ray and CBC ordered. Completed a course of doxycycline and no improvement. He is not in any acute distress.  Wheezing on exam. Albuterol and Tessalon Perles prescribed.  Recommend taking Mucinex over-the-counter.  Increase hydration.  Advised him to stop  Hycodan due to symptoms of nausea, constipation and poor appetite.  Send antibiotic if warranted pending CXR. Follow-up pending results or if he is not improving in the next week.

## 2022-10-15 NOTE — Patient Instructions (Signed)
Please go downstairs for labs and a chest x-ray before you leave.  I recommend taking over-the-counter Mucinex and staying well-hydrated.  I prescribed an albuterol inhaler for you to use to help open up your airways and cough up the mucus.  I also prescribed Tessalon Perles to help with cough.  I recommend stopping the hydrocodone cough syrup.  This is most likely making you have nausea, constipation and decreased appetite.

## 2022-10-24 ENCOUNTER — Other Ambulatory Visit: Payer: Self-pay | Admitting: Internal Medicine

## 2022-10-24 NOTE — Telephone Encounter (Signed)
Please refill as per office routine med refill policy (all routine meds to be refilled for 3 mo or monthly (per pt preference) up to one year from last visit, then month to month grace period for 3 mo, then further med refills will have to be denied)

## 2022-10-28 ENCOUNTER — Ambulatory Visit: Payer: Managed Care, Other (non HMO)

## 2022-10-28 ENCOUNTER — Ambulatory Visit: Payer: Managed Care, Other (non HMO) | Admitting: Internal Medicine

## 2022-10-28 VITALS — BP 128/82 | HR 92 | Temp 98.2°F | Ht 71.0 in | Wt 307.0 lb

## 2022-10-28 DIAGNOSIS — K429 Umbilical hernia without obstruction or gangrene: Secondary | ICD-10-CM

## 2022-10-28 DIAGNOSIS — E559 Vitamin D deficiency, unspecified: Secondary | ICD-10-CM | POA: Diagnosis not present

## 2022-10-28 DIAGNOSIS — E538 Deficiency of other specified B group vitamins: Secondary | ICD-10-CM | POA: Diagnosis not present

## 2022-10-28 DIAGNOSIS — Z0001 Encounter for general adult medical examination with abnormal findings: Secondary | ICD-10-CM | POA: Diagnosis not present

## 2022-10-28 DIAGNOSIS — E1165 Type 2 diabetes mellitus with hyperglycemia: Secondary | ICD-10-CM

## 2022-10-28 DIAGNOSIS — R1011 Right upper quadrant pain: Secondary | ICD-10-CM

## 2022-10-28 DIAGNOSIS — R1013 Epigastric pain: Secondary | ICD-10-CM | POA: Diagnosis not present

## 2022-10-28 DIAGNOSIS — Z125 Encounter for screening for malignant neoplasm of prostate: Secondary | ICD-10-CM | POA: Diagnosis not present

## 2022-10-28 DIAGNOSIS — K5900 Constipation, unspecified: Secondary | ICD-10-CM | POA: Diagnosis not present

## 2022-10-28 LAB — LIPID PANEL
Cholesterol: 102 mg/dL (ref 0–200)
HDL: 30.7 mg/dL — ABNORMAL LOW (ref >39.00)
LDL Cholesterol: 52 mg/dL (ref 0–99)
NonHDL: 70.94
Total CHOL/HDL Ratio: 3
Triglycerides: 94 mg/dL (ref 0.0–149.0)
VLDL: 18.8 mg/dL (ref 0.0–40.0)

## 2022-10-28 LAB — CBC WITH DIFFERENTIAL/PLATELET
Basophils Absolute: 0 10*3/uL (ref 0.0–0.1)
Basophils Relative: 0.3 % (ref 0.0–3.0)
Eosinophils Absolute: 0.2 10*3/uL (ref 0.0–0.7)
Eosinophils Relative: 2 % (ref 0.0–5.0)
HCT: 42.5 % (ref 39.0–52.0)
Hemoglobin: 14.3 g/dL (ref 13.0–17.0)
Lymphocytes Relative: 24.7 % (ref 12.0–46.0)
Lymphs Abs: 2.7 10*3/uL (ref 0.7–4.0)
MCHC: 33.6 g/dL (ref 30.0–36.0)
MCV: 86.6 fl (ref 78.0–100.0)
Monocytes Absolute: 0.8 10*3/uL (ref 0.1–1.0)
Monocytes Relative: 7.5 % (ref 3.0–12.0)
Neutro Abs: 7 10*3/uL (ref 1.4–7.7)
Neutrophils Relative %: 65.5 % (ref 43.0–77.0)
Platelets: 300 10*3/uL (ref 150.0–400.0)
RBC: 4.91 Mil/uL (ref 4.22–5.81)
RDW: 13.9 % (ref 11.5–15.5)
WBC: 10.8 10*3/uL — ABNORMAL HIGH (ref 4.0–10.5)

## 2022-10-28 LAB — URINALYSIS, ROUTINE W REFLEX MICROSCOPIC
Bilirubin Urine: NEGATIVE
Ketones, ur: NEGATIVE
Leukocytes,Ua: NEGATIVE
Nitrite: NEGATIVE
Specific Gravity, Urine: 1.025 (ref 1.000–1.030)
Total Protein, Urine: NEGATIVE
Urine Glucose: 250 — AB
Urobilinogen, UA: 0.2 (ref 0.0–1.0)
pH: 6 (ref 5.0–8.0)

## 2022-10-28 LAB — VITAMIN D 25 HYDROXY (VIT D DEFICIENCY, FRACTURES): VITD: 78.04 ng/mL (ref 30.00–100.00)

## 2022-10-28 LAB — BASIC METABOLIC PANEL
BUN: 15 mg/dL (ref 6–23)
CO2: 29 mEq/L (ref 19–32)
Calcium: 9.3 mg/dL (ref 8.4–10.5)
Chloride: 99 mEq/L (ref 96–112)
Creatinine, Ser: 1.08 mg/dL (ref 0.40–1.50)
GFR: 74.77 mL/min (ref >60.00)
Glucose, Bld: 137 mg/dL — ABNORMAL HIGH (ref 70–99)
Potassium: 4.7 mEq/L (ref 3.5–5.1)
Sodium: 137 mEq/L (ref 135–145)

## 2022-10-28 LAB — HEPATIC FUNCTION PANEL
ALT: 24 U/L (ref 0–53)
AST: 19 U/L (ref 0–37)
Albumin: 4.4 g/dL (ref 3.5–5.2)
Alkaline Phosphatase: 43 U/L (ref 39–117)
Bilirubin, Direct: 0.1 mg/dL (ref 0.0–0.3)
Total Bilirubin: 0.4 mg/dL (ref 0.2–1.2)
Total Protein: 7.4 g/dL (ref 6.0–8.3)

## 2022-10-28 LAB — MICROALBUMIN / CREATININE URINE RATIO
Creatinine,U: 206 mg/dL
Microalb Creat Ratio: 1 mg/g (ref 0.0–30.0)
Microalb, Ur: 2 mg/dL — ABNORMAL HIGH (ref 0.0–1.9)

## 2022-10-28 LAB — PSA: PSA: 0.35 ng/mL (ref 0.10–4.00)

## 2022-10-28 LAB — HEMOGLOBIN A1C: Hgb A1c MFr Bld: 7.2 % — ABNORMAL HIGH (ref 4.6–6.5)

## 2022-10-28 LAB — TSH: TSH: 1.6 u[IU]/mL (ref 0.35–5.50)

## 2022-10-28 LAB — VITAMIN B12: Vitamin B-12: 1187 pg/mL — ABNORMAL HIGH (ref 211–911)

## 2022-10-28 LAB — LIPASE: Lipase: 34 U/L (ref 11.0–59.0)

## 2022-10-28 MED ORDER — PANTOPRAZOLE SODIUM 40 MG PO TBEC: 40.0000 mg | DELAYED_RELEASE_TABLET | Freq: Every day | ORAL | 3 refills | Status: DC

## 2022-10-28 NOTE — Patient Instructions (Addendum)
Please take all new medication as prescribed - the protonix for stomach acid  Please continue all other medications as before, and refills have been done if requested.  Please have the pharmacy call with any other refills you may need.  Please continue your efforts at being more active, low cholesterol diet, and weight control.  You are otherwise up to date with prevention measures today.  Please keep your appointments with your specialists as you may have planned  You will be contacted regarding the referral for: General Surgury  Please go to the XRAY Department in the first floor for the x-ray testing  Please go to the LAB at the blood drawing area for the tests to be done  You will be contacted by phone if any changes need to be made immediately.  Otherwise, you will receive a letter about your results with an explanation, but please check with MyChart first.  Please remember to sign up for MyChart if you have not done so, as this will be important to you in the future with finding out test results, communicating by private email, and scheduling acute appointments online when needed.  Please make an Appointment to return in 6 months, or sooner if needed, also with Lab Appointment for testing done 3-5 days before at the [place] (so this is for TWO appointments - please see the scheduling desk as you leave)

## 2022-10-28 NOTE — Progress Notes (Signed)
Patient ID: Martin Lozano, male   DOB: July 03, 1961, 62 y.o.   MRN: [id number]         Chief Complaint:: wellness exam and Nausea, Constipation, and Possible hernia disruption  , dm, low b12 and low vit d       HPI:  Martin Lozano is a 62 y.o. male here for wellness exam; pt states will call for eye exam, for shingrix at pharmacy, declines tdap, flu shot, colonoscopy, but will have cologuard, o/w up to date                        Also had recent URI and resolved, but still not really well as still has some nausea and watery regurgitation a few times, indigestion, and constipation. May not have been drinking enough fluids lately.  Pt denies chest pain, increased sob or doe, wheezing, orthopnea, PND, increased LE swelling, palpitations, dizziness or syncope.   Pt denies polydipsia, polyuria, or new focal neuro s/s.    Pt denies fever, wt loss, night sweats, loss of appetite, or other constitutional symptoms  Has lost several lbs recently with better diet.  Does also have worssening 2 mo mild intermittent umbilical hernia pain and swelling but reducible Wt Readings from Last 3 Encounters:  10/28/22 (!) 307 lb (139.3 kg)  10/15/22 (!) 308 lb (139.7 kg)  09/30/22 (!) 321 lb (145.6 kg)   BP Readings from Last 3 Encounters:  10/28/22 128/82  10/15/22 130/84  09/30/22 130/80   Immunization History  Administered Date(s) Administered   Influenza Whole 10/06/2010   Influenza,inj,Quad PF,6+ Mos 06/25/2016, 09/16/2017, 07/23/2019   PFIZER(Purple Top)SARS-COV-2 Vaccination 04/11/2020, 05/19/2020   Pneumococcal Conjugate-13 03/10/2017   Pneumococcal Polysaccharide-23 11/22/2018   Td 06/18/2009   Health Maintenance Due  Topic Date Due   DTaP/Tdap/Td (2 - Tdap) 06/19/2019   OPHTHALMOLOGY EXAM  04/17/2022      Past Medical History:  Diagnosis Date   ALLERGIC RHINITIS 09/06/2007   ASTHMATIC BRONCHITIS, ACUTE 11/16/2007   HEMORRHOIDS 07/03/2007   HYPERLIPIDEMIA 09/06/2007   on meds    HYPERTENSION 07/03/2007   on meds   KNEE SPRAIN, LEFT 07/30/2008   OBESITY, MORBID 07/03/2007   Other tenosynovitis of hand and wrist 06/11/2010   OTITIS MEDIA, ACUTE, LEFT 11/10/2009   Type II or unspecified type diabetes mellitus without mention of complication, uncontrolled 09/25/2012   on meds   Umbilical hernia    Wheezing 09/09/2009   Past Surgical History:  Procedure Laterality Date   EXTRACORPOREAL SHOCK WAVE LITHOTRIPSY Left 07/28/2020   Procedure: LEFT EXTRACORPOREAL SHOCK WAVE LITHOTRIPSY (ESWL);  Surgeon: Festus Aloe, MD;  Location: [facility];  Service: Urology;  Laterality: Left;   WISDOM TOOTH EXTRACTION      reports that he has never smoked. He has never used smokeless tobacco. He reports that he does not currently use alcohol. He reports that he does not use drugs. family history includes Colon polyps (age of onset: 59) in his brother. No Known Allergies Current Outpatient Medications on File Prior to Visit  Medication Sig Dispense Refill   albuterol (VENTOLIN HFA) 108 (90 Base) MCG/ACT inhaler Inhale 2 puffs into the lungs every 6 (six) hours as needed for wheezing or shortness of breath. 8 g 0   aspirin 81 MG tablet Take 81 mg by mouth daily.     benzonatate (TESSALON) 200 MG capsule Take 1 capsule (200 mg total) by mouth 2 (two) times daily as needed for cough.  20 capsule 0   Cholecalciferol (THERA-D 2000) 50 MCG (2000 UT) TABS 1 tab by mouth once daily 30 tablet 99   cyanocobalamin (VITAMIN B12) 1000 MCG tablet TAKE 1 TABLET BY MOUTH EVERY DAY 100 tablet 1   glipiZIDE (GLUCOTROL XL) 10 MG 24 hr tablet TAKE 1 TABLET (10 MG TOTAL) BY MOUTH DAILY WITH BREAKFAST. 90 tablet 3   lisinopril (ZESTRIL) 20 MG tablet TAKE 1 TABLET BY MOUTH EVERY DAY 90 tablet 1   metFORMIN (GLUCOPHAGE) 500 MG tablet TAKE 2 TABLETS BY MOUTH AM AND 2 TABLETS IN THE EVENING 360 tablet 3   rosuvastatin (CRESTOR) 20 MG tablet TAKE ONE TABLET DAILY 90 tablet 3   Semaglutide  (RYBELSUS) 14 MG TABS Take 1 tablet (14 mg total) by mouth daily. 90 tablet 3   No current facility-administered medications on file prior to visit.        ROS:  All others reviewed and negative.  Objective        PE:  BP 128/82 (BP Location: Right Arm, Patient Position: Sitting, Cuff Size: Large)   Pulse 92   Temp 98.2 F (36.8 C) (Oral)   Ht 5\' 11"  (1.803 m)   Wt (!) 307 lb (139.3 kg)   SpO2 95%   BMI 42.82 kg/m                 Constitutional: Pt appears in NAD               HENT: Head: NCAT.                Right Ear: External ear normal.                 Left Ear: External ear normal.                Eyes: . Pupils are equal, round, and reactive to light. Conjunctivae and EOM are normal               Nose: without d/c or deformity               Neck: Neck supple. Gross normal ROM               Cardiovascular: Normal rate and regular rhythm.                 Pulmonary/Chest: Effort normal and breath sounds without rales or wheezing.                Abd:  Soft, NT, ND, + BS, no organomegaly               Neurological: Pt is alert. At baseline orientation, motor grossly intact               Skin: Skin is warm. No rashes, no other new lesions, LE edema - none               Psychiatric: Pt behavior is normal without agitation   Micro: none  Cardiac tracings I have personally interpreted today:  none  Pertinent Radiological findings (summarize): 1-12 - 24 - CXR - NAD   Lab Results  Component Value Date   WBC 10.8 (H) 10/28/2022   HGB 14.3 10/28/2022   HCT 42.5 10/28/2022   PLT 300.0 10/28/2022   GLUCOSE 137 (H) 10/28/2022   CHOL 102 10/28/2022   TRIG 94.0 10/28/2022   HDL 30.70 (L) 10/28/2022   LDLDIRECT 88.0 11/02/2018   LDLCALC 52 10/28/2022  ALT 24 10/28/2022   AST 19 10/28/2022   NA 137 10/28/2022   K 4.7 10/28/2022   CL 99 10/28/2022   CREATININE 1.08 10/28/2022   BUN 15 10/28/2022   CO2 29 10/28/2022   TSH 1.60 10/28/2022   PSA 0.35 10/28/2022   HGBA1C 7.2  (H) 10/28/2022   MICROALBUR 2.0 (H) 10/28/2022   Assessment/Plan:  Martin Lozano is a 62 y.o. White or Caucasian [1] male with  has a past medical history of ALLERGIC RHINITIS (09/06/2007), ASTHMATIC BRONCHITIS, ACUTE (11/16/2007), HEMORRHOIDS (07/03/2007), HYPERLIPIDEMIA (09/06/2007), HYPERTENSION (07/03/2007), KNEE SPRAIN, LEFT (07/30/2008), OBESITY, MORBID (07/03/2007), Other tenosynovitis of hand and wrist (06/11/2010), OTITIS MEDIA, ACUTE, LEFT (11/10/2009), Type II or unspecified type diabetes mellitus without mention of complication, uncontrolled (09/25/2012), Umbilical hernia, and Wheezing (09/09/2009).  Encounter for well adult exam with abnormal findings Age and sex appropriate education and counseling updated with regular exercise and diet Referrals for preventative services - declines colonoscopy but for cologuard, pt will call for eye exam Immunizations addressed - declines tdap, for shingrx at pharmacy Smoking counseling  - none needed Evidence for depression or other mood disorder - none significant Most recent labs reviewed. I have personally reviewed and have noted: 1) the patient's medical and social history 2) The patient's current medications and supplements 3) The patient's height, weight, and BMI have been recorded in the chart   [facility]) Lab Results  Component Value Date   HGBA1C 7.2 (H) 10/28/2022   uncontrolled, pt to continue current medical treatment glipizide ER 10 qd, metformine 500mg  - 2 bid, and rybelsus 14 mg qd   Umbilical hernia With worsening symptoms, for general surgury referral  Vitamin B12 deficiency Lab Results  Component Value Date   VITAMINB12 1,187 (H) 10/28/2022   Stable, cont oral replacement - b12 1000 mcg qd   Vitamin D deficiency Last vitamin D Lab Results  Component Value Date   VD25OH 78.04 10/28/2022   Stable, cont oral replacement   Dyspepsia Ok for protonix 40 mg qd trial,  to f/u any worsening symptoms or  concerns  Constipation Also for otc miralax prn  Followup: No follow-ups on file.  10/30/2022, MD 11/01/2022 5:12 AM [facility] [place] [facility] - [facility] Internal Medicine

## 2022-11-01 ENCOUNTER — Encounter: Payer: Self-pay | Admitting: Internal Medicine

## 2022-11-01 DIAGNOSIS — R1013 Epigastric pain: Secondary | ICD-10-CM | POA: Insufficient documentation

## 2022-11-01 DIAGNOSIS — K5900 Constipation, unspecified: Secondary | ICD-10-CM | POA: Insufficient documentation

## 2022-11-01 NOTE — Assessment & Plan Note (Signed)
Also for otc miralax prn

## 2022-11-01 NOTE — Assessment & Plan Note (Signed)
Lab Results  Component Value Date   HGBA1C 7.2 (H) 10/28/2022   uncontrolled, pt to continue current medical treatment glipizide ER 10 qd, metformine 500mg  - 2 bid, and rybelsus 14 mg qd

## 2022-11-01 NOTE — Assessment & Plan Note (Signed)
Ok for protonix 40 mg qd trial,  to f/u any worsening symptoms or concerns

## 2022-11-01 NOTE — Assessment & Plan Note (Signed)
Last vitamin D Lab Results  Component Value Date   VD25OH 78.04 10/28/2022   Stable, cont oral replacement

## 2022-11-01 NOTE — Assessment & Plan Note (Signed)
Age and sex appropriate education and counseling updated with regular exercise and diet Referrals for preventative services - declines colonoscopy but for cologuard, pt will call for eye exam Immunizations addressed - declines tdap, for shingrx at pharmacy Smoking counseling  - none needed Evidence for depression or other mood disorder - none significant Most recent labs reviewed. I have personally reviewed and have noted: 1) the patient's medical and social history 2) The patient's current medications and supplements 3) The patient's height, weight, and BMI have been recorded in the chart

## 2022-11-01 NOTE — Assessment & Plan Note (Signed)
Lab Results  Component Value Date   VITAMINB12 1,187 (H) 10/28/2022   Stable, cont oral replacement - b12 1000 mcg qd

## 2022-11-01 NOTE — Assessment & Plan Note (Signed)
With worsening symptoms, for general surgury referral

## 2022-11-05 ENCOUNTER — Encounter: Payer: Self-pay | Admitting: Internal Medicine

## 2022-11-08 MED ORDER — PIOGLITAZONE HCL 30 MG PO TABS: 30.0000 mg | ORAL_TABLET | Freq: Every day | ORAL | 3 refills | Status: DC

## 2022-11-17 ENCOUNTER — Encounter: Payer: Self-pay | Admitting: Internal Medicine

## 2022-11-17 ENCOUNTER — Ambulatory Visit
Admission: RE | Admit: 2022-11-17 | Discharge: 2022-11-17 | Disposition: A | Discharge status: Home / Self Care | Payer: Managed Care, Other (non HMO) | Source: Ambulatory Visit | Attending: Internal Medicine | Admitting: Internal Medicine

## 2022-11-17 DIAGNOSIS — R1011 Right upper quadrant pain: Secondary | ICD-10-CM

## 2023-02-26 NOTE — Progress Notes (Signed)
none

## 2023-03-02 ENCOUNTER — Encounter: Payer: Self-pay | Admitting: Internal Medicine

## 2023-03-02 ENCOUNTER — Ambulatory Visit: Payer: Managed Care, Other (non HMO) | Admitting: Internal Medicine

## 2023-03-02 VITALS — BP 120/78 | HR 70 | Temp 98.0°F | Ht 71.0 in | Wt 325.0 lb

## 2023-03-02 DIAGNOSIS — Z7984 Long term (current) use of oral hypoglycemic drugs: Secondary | ICD-10-CM | POA: Diagnosis not present

## 2023-03-02 DIAGNOSIS — E538 Deficiency of other specified B group vitamins: Secondary | ICD-10-CM

## 2023-03-02 DIAGNOSIS — G5712 Meralgia paresthetica, left lower limb: Secondary | ICD-10-CM | POA: Diagnosis not present

## 2023-03-02 DIAGNOSIS — M25511 Pain in right shoulder: Secondary | ICD-10-CM | POA: Diagnosis not present

## 2023-03-02 DIAGNOSIS — E1165 Type 2 diabetes mellitus with hyperglycemia: Secondary | ICD-10-CM

## 2023-03-02 DIAGNOSIS — E559 Vitamin D deficiency, unspecified: Secondary | ICD-10-CM

## 2023-03-02 LAB — HEPATIC FUNCTION PANEL
ALT: 14 U/L (ref 0–53)
AST: 16 U/L (ref 0–37)
Albumin: 4.1 g/dL (ref 3.5–5.2)
Alkaline Phosphatase: 42 U/L (ref 39–117)
Bilirubin, Direct: 0.1 mg/dL (ref 0.0–0.3)
Total Bilirubin: 0.4 mg/dL (ref 0.2–1.2)
Total Protein: 7.2 g/dL (ref 6.0–8.3)

## 2023-03-02 LAB — LIPID PANEL
Cholesterol: 126 mg/dL (ref 0–200)
HDL: 38.2 mg/dL — ABNORMAL LOW
LDL Cholesterol: 62 mg/dL (ref 0–99)
NonHDL: 87.86
Total CHOL/HDL Ratio: 3
Triglycerides: 129 mg/dL (ref 0.0–149.0)
VLDL: 25.8 mg/dL (ref 0.0–40.0)

## 2023-03-02 LAB — BASIC METABOLIC PANEL
BUN: 19 mg/dL (ref 6–23)
CO2: 30 mEq/L (ref 19–32)
Calcium: 9.1 mg/dL (ref 8.4–10.5)
Chloride: 102 mEq/L (ref 96–112)
Creatinine, Ser: 1.17 mg/dL (ref 0.40–1.50)
GFR: 67.76 mL/min (ref >60.00)
Glucose, Bld: 84 mg/dL (ref 70–99)
Potassium: 4.6 mEq/L (ref 3.5–5.1)
Sodium: 138 mEq/L (ref 135–145)

## 2023-03-02 LAB — VITAMIN B12: Vitamin B-12: 852 pg/mL (ref 211–911)

## 2023-03-02 LAB — VITAMIN D 25 HYDROXY (VIT D DEFICIENCY, FRACTURES): VITD: 71.94 ng/mL (ref 30.00–100.00)

## 2023-03-02 MED ORDER — MELOXICAM 15 MG PO TABS: 15.0000 mg | ORAL_TABLET | Freq: Every day | ORAL | 5 refills | Status: DC | PRN

## 2023-03-02 NOTE — Patient Instructions (Signed)
Please take all new medication as prescribed - the anti-inflammatory as needed  Please continue all other medications as before, and refills have been done if requested.  Please have the pharmacy call with any other refills you may need.  Please keep your appointments with your specialists as you may have planned  You will be contacted regarding the referral for: Sports medicine  Please go to the LAB at the blood drawing area for the tests to be done  You will be contacted by phone if any changes need to be made immediately.  Otherwise, you will receive a letter about your results with an explanation, but please check with MyChart first.  Please remember to sign up for MyChart if you have not done so, as this will be important to you in the future with finding out test results, communicating by private email, and scheduling acute appointments online when needed.  Please make an Appointment to return in 6 months, or sooner if needed

## 2023-03-02 NOTE — Assessment & Plan Note (Signed)
Likely bicipital tendonitis - for sport med referral, may need cortisone inj

## 2023-03-02 NOTE — Assessment & Plan Note (Signed)
Lab Results  Component Value Date   VITAMINB12 1,187 (H) 10/28/2022   Stable, cont oral replacement - b12 1000 mcg qd  

## 2023-03-02 NOTE — Assessment & Plan Note (Signed)
Mild intermittent, avoid tight clothing, also for wt loss

## 2023-03-02 NOTE — Assessment & Plan Note (Signed)
Lab Results  Component Value Date   HGBA1C 7.2 (H) 10/28/2022   Uncontrolled,, pt to continue current medical treatment gipizide xl 10 qd  metformin 500 mg - 2 bid, actos 30 qd and f/u a1c

## 2023-03-02 NOTE — Assessment & Plan Note (Signed)
Last vitamin D Lab Results  Component Value Date   VD25OH 78.04 10/28/2022   Stable, cont oral replacement  

## 2023-03-02 NOTE — Progress Notes (Signed)
Patient ID: PRIEST GARG, male   DOB: 1960-11-25, 62 y.o.   MRN: [id number]        Chief Complaint: follow up right shoulder pain, meralgia parethesthetica, dm, low vit d and b12       HPI:  Martin Lozano is a 62 y.o. male here with c/o 2 wks osnet worsening right anterior shoulder pain and tenderness, worse to raise the arm but appears to have full ROM; also has left upper thigh numbness at times after standing for over 15 minutes reproducibly; no worsening lbp or left leg weakness or falls.  Pt denies chest pain, increased sob or doe, wheezing, orthopnea, PND, increased LE swelling, palpitations, dizziness or syncope.   Pt denies polydipsia, polyuria, or new focal neuro s/s.    Pt denies fever, wt loss, night sweats, loss of appetite, or other constitutional symptoms         Wt Readings from Last 3 Encounters:  03/02/23 (!) 325 lb (147.4 kg)  10/28/22 (!) 307 lb (139.3 kg)  10/15/22 (!) 308 lb (139.7 kg)   BP Readings from Last 3 Encounters:  03/02/23 120/78  10/28/22 128/82  10/15/22 130/84         Past Medical History:  Diagnosis Date   ALLERGIC RHINITIS 09/06/2007   ASTHMATIC BRONCHITIS, ACUTE 11/16/2007   HEMORRHOIDS 07/03/2007   HYPERLIPIDEMIA 09/06/2007   on meds   HYPERTENSION 07/03/2007   on meds   KNEE SPRAIN, LEFT 07/30/2008   OBESITY, MORBID 07/03/2007   Other tenosynovitis of hand and wrist 06/11/2010   OTITIS MEDIA, ACUTE, LEFT 11/10/2009   Type II or unspecified type diabetes mellitus without mention of complication, uncontrolled 09/25/2012   on meds   Umbilical hernia    Wheezing 09/09/2009   Past Surgical History:  Procedure Laterality Date   EXTRACORPOREAL SHOCK WAVE LITHOTRIPSY Left 07/28/2020   Procedure: LEFT EXTRACORPOREAL SHOCK WAVE LITHOTRIPSY (ESWL);  Surgeon: Jerilee Field, MD;  Location: [facility];  Service: Urology;  Laterality: Left;   WISDOM TOOTH EXTRACTION      reports that he has never smoked. He has never used  smokeless tobacco. He reports that he does not currently use alcohol. He reports that he does not use drugs. family history includes Colon polyps (age of onset: 60) in his brother. No Known Allergies Current Outpatient Medications on File Prior to Visit  Medication Sig Dispense Refill   albuterol (VENTOLIN HFA) 108 (90 Base) MCG/ACT inhaler Inhale 2 puffs into the lungs every 6 (six) hours as needed for wheezing or shortness of breath. 8 g 0   aspirin 81 MG tablet Take 81 mg by mouth daily.     benzonatate (TESSALON) 200 MG capsule Take 1 capsule (200 mg total) by mouth 2 (two) times daily as needed for cough. 20 capsule 0   Cholecalciferol (THERA-D 2000) 50 MCG (2000 UT) TABS 1 tab by mouth once daily 30 tablet 99   cyanocobalamin (VITAMIN B12) 1000 MCG tablet TAKE 1 TABLET BY MOUTH EVERY DAY 100 tablet 1   glipiZIDE (GLUCOTROL XL) 10 MG 24 hr tablet TAKE 1 TABLET (10 MG TOTAL) BY MOUTH DAILY WITH BREAKFAST. 90 tablet 3   lisinopril (ZESTRIL) 20 MG tablet TAKE 1 TABLET BY MOUTH EVERY DAY 90 tablet 1   metFORMIN (GLUCOPHAGE) 500 MG tablet TAKE 2 TABLETS BY MOUTH AM AND 2 TABLETS IN THE EVENING 360 tablet 3   pantoprazole (PROTONIX) 40 MG tablet Take 1 tablet (40 mg total) by mouth daily. 90  tablet 3   pioglitazone (ACTOS) 30 MG tablet Take 1 tablet (30 mg total) by mouth daily. 90 tablet 3   rosuvastatin (CRESTOR) 20 MG tablet TAKE ONE TABLET DAILY 90 tablet 3   No current facility-administered medications on file prior to visit.        ROS:  All others reviewed and negative.  Objective        PE:  BP 120/78 (BP Location: Left Arm, Patient Position: Sitting, Cuff Size: Normal)   Pulse 70   Temp 98 F (36.7 C) (Oral)   Ht 5\' 11"  (1.803 m)   Wt (!) 325 lb (147.4 kg)   SpO2 98%   BMI 45.33 kg/m                 Constitutional: Pt appears in NAD               HENT: Head: NCAT.                Right Ear: External ear normal.                 Left Ear: External ear normal.                 Eyes: . Pupils are equal, round, and reactive to light. Conjunctivae and EOM are normal               Nose: without d/c or deformity               Neck: Neck supple. Gross normal ROM               Cardiovascular: Normal rate and regular rhythm.                 Pulmonary/Chest: Effort normal and breath sounds without rales or wheezing.                Abd:  Soft, NT, ND, + BS, no organomegaly               Neurological: Pt is alert. At baseline orientation, motor grossly intact               Skin: Skin is warm. No rashes, no other new lesions, LE edema - none; right shoudler with tender right bicipital tendon insertion site               Psychiatric: Pt behavior is normal without agitation   Micro: none  Cardiac tracings I have personally interpreted today:  none  Pertinent Radiological findings (summarize): none   Lab Results  Component Value Date   WBC 10.8 (H) 10/28/2022   HGB 14.3 10/28/2022   HCT 42.5 10/28/2022   PLT 300.0 10/28/2022   GLUCOSE 137 (H) 10/28/2022   CHOL 102 10/28/2022   TRIG 94.0 10/28/2022   HDL 30.70 (L) 10/28/2022   LDLDIRECT 88.0 11/02/2018   LDLCALC 52 10/28/2022   ALT 24 10/28/2022   AST 19 10/28/2022   NA 137 10/28/2022   K 4.7 10/28/2022   CL 99 10/28/2022   CREATININE 1.08 10/28/2022   BUN 15 10/28/2022   CO2 29 10/28/2022   TSH 1.60 10/28/2022   PSA 0.35 10/28/2022   HGBA1C 7.2 (H) 10/28/2022   MICROALBUR 2.0 (H) 10/28/2022   Assessment/Plan:  Martin Lozano is a 62 y.o. White or Caucasian [1] male with  has a past medical history of ALLERGIC RHINITIS (09/06/2007), ASTHMATIC BRONCHITIS, ACUTE (11/16/2007), HEMORRHOIDS (07/03/2007), HYPERLIPIDEMIA (09/06/2007), HYPERTENSION (07/03/2007),  KNEE SPRAIN, LEFT (07/30/2008), OBESITY, MORBID (07/03/2007), Other tenosynovitis of hand and wrist (06/11/2010), OTITIS MEDIA, ACUTE, LEFT (11/10/2009), Type II or unspecified type diabetes mellitus without mention of complication, uncontrolled (09/25/2012),  Umbilical hernia, and Wheezing (09/09/2009).  Diabetes (HCC) Lab Results  Component Value Date   HGBA1C 7.2 (H) 10/28/2022   Uncontrolled,, pt to continue current medical treatment gipizide xl 10 qd  metformin 500 mg - 2 bid, actos 30 qd and f/u a1c   Vitamin D deficiency Last vitamin D Lab Results  Component Value Date   VD25OH 78.04 10/28/2022   Stable, cont oral replacement   Vitamin B12 deficiency Lab Results  Component Value Date   VITAMINB12 1,187 (H) 10/28/2022   Stable, cont oral replacement - b12 1000 mcg qd   Acute pain of right shoulder Likely bicipital tendonitis - for sport med referral, may need cortisone inj  Meralgia paraesthetica, left Mild intermittent, avoid tight clothing, also for wt loss  Followup: Return in about 6 months (around 09/02/2023).  Oliver Barre, MD 03/02/2023 8:30 PM [facility]

## 2023-03-03 LAB — HEMOGLOBIN A1C: Hgb A1c MFr Bld: 6.4 % (ref 4.6–6.5)

## 2023-03-03 LAB — HM DIABETES EYE EXAM

## 2023-03-04 ENCOUNTER — Other Ambulatory Visit (HOSPITAL_COMMUNITY): Payer: Self-pay

## 2023-03-07 ENCOUNTER — Other Ambulatory Visit: Payer: Self-pay

## 2023-03-07 ENCOUNTER — Ambulatory Visit (INDEPENDENT_AMBULATORY_CARE_PROVIDER_SITE_OTHER): Payer: Managed Care, Other (non HMO)

## 2023-03-07 ENCOUNTER — Ambulatory Visit: Payer: Managed Care, Other (non HMO) | Admitting: Family Medicine

## 2023-03-07 VITALS — BP 130/74 | HR 92 | Ht 71.0 in | Wt 328.0 lb

## 2023-03-07 DIAGNOSIS — M25511 Pain in right shoulder: Secondary | ICD-10-CM

## 2023-03-07 DIAGNOSIS — G8929 Other chronic pain: Secondary | ICD-10-CM

## 2023-03-07 NOTE — Patient Instructions (Addendum)
Thank you for coming in today.   Please get an Xray today before you leave   I've referred you to Physical Therapy.  Let us know if you don't hear from them in one week.   Recheck in about 6 weeks. Let me know sooner if this is not working.

## 2023-03-07 NOTE — Progress Notes (Signed)
Martin Payor, PhD, LAT, ATC acting as a scribe for Martin Graham, MD.  Martin Lozano is a 62 y.o. male who presents to [facility] at [facility] today for R shoulder pain ongoing for about 4-6 weeks. No MOI. Pain has improved somewhat over the last week. Pt locates pain to the anterior aspect of the R shoulder and into the upper arm.   Neck pain: no Radiates: slightly into biceps Aggravates: horz aBd,  Treatments tried: none  Pertinent review of systems: No fevers or chills  Relevant historical information: Diabetes   Exam:  BP 130/74   Pulse 92   Ht 5\' 11"  (1.803 m)   Wt (!) 328 lb (148.8 kg)   SpO2 98%   BMI 45.75 kg/m  General: Well Developed, well nourished, and in no acute distress.   MSK: Right shoulder: Normal-appearing Nontender. Normal motion to abduction.  Limited functional internal rotation to iliac crest posterior aspect. Strength slightly limited abduction 4/5 otherwise intact. Positive Hawkins and Neer's test.  Negative Yergason's and speeds test.    Lab and Radiology Results  Diagnostic Limited MSK Ultrasound of: Right shoulder Biceps tendon difficult to visualize but appears to be intact. Subscapularis tendon also difficult to visualize but does appear to be intact without visible retracted tear. Supraspinatus tendon no retracted rotator cuff tears are present. Mild to moderate subacromial bursitis is present. Infraspinatus tendon intact. Impression: Subacromial bursitis   X-ray images right shoulder obtained today personally and independently interpreted Minimal glenohumeral DJD.  No acute fractures are visible. Await formal radiology review   Assessment and Plan: 62 y.o. male with right shoulder pain.  Pain thought to be due to impingement and subacromial bursitis.  He is a good candidate for trial of PT.  Plan to refer to PT and check back in 6 weeks.  If not improving consider steroid injection.  He is feeling better  today than he was last week so we may be able to avoid injection.  However if his pain returns and is severe he will let me know and he can come back for shot.   PDMP not reviewed this encounter. Orders Placed This Encounter  Procedures   DG Shoulder Right    Standing Status:   Future    Number of Occurrences:   1    Standing Expiration Date:   03/06/2024    Order Specific Question:   Reason for Exam (SYMPTOM  OR DIAGNOSIS REQUIRED)    Answer:   right shoulder pain    Order Specific Question:   Preferred imaging location?    Answer:   [facility]   [place] LIMITED JOINT SPACE STRUCTURES UP RIGHT(NO LINKED CHARGES)    Order Specific Question:   Reason for Exam (SYMPTOM  OR DIAGNOSIS REQUIRED)    Answer:   right shoulder pain    Order Specific Question:   Preferred imaging location?    Answer:   [facility] referral to Physical Therapy    Referral Priority:   Routine    Referral Type:   Physical [facility]    Referral Reason:   Specialty Services Required    Requested Specialty:   Physical Therapy    Number of Visits Requested:   1   No orders of the defined types were placed in this encounter.    Discussed warning signs or symptoms. Please see discharge instructions. Patient expresses understanding.   The above documentation has been reviewed and  is accurate and complete Martin Lozano, M.D.

## 2023-03-08 NOTE — Progress Notes (Signed)
Right shoulder x-ray looks normal to radiology

## 2023-03-09 ENCOUNTER — Other Ambulatory Visit: Payer: Self-pay | Admitting: Internal Medicine

## 2023-04-04 ENCOUNTER — Ambulatory Visit: Payer: Managed Care, Other (non HMO) | Attending: Family Medicine

## 2023-04-04 ENCOUNTER — Other Ambulatory Visit: Payer: Self-pay

## 2023-04-04 DIAGNOSIS — M25511 Pain in right shoulder: Secondary | ICD-10-CM | POA: Insufficient documentation

## 2023-04-04 DIAGNOSIS — M7541 Impingement syndrome of right shoulder: Secondary | ICD-10-CM | POA: Diagnosis present

## 2023-04-04 DIAGNOSIS — G8929 Other chronic pain: Secondary | ICD-10-CM | POA: Insufficient documentation

## 2023-04-04 NOTE — Therapy (Signed)
OUTPATIENT PHYSICAL THERAPY SHOULDER EVALUATION   Patient Name: Martin Lozano MRN: [id number] DOB:Jul 21, 1961, 62 y.o., male Today's Date: 04/04/2023  END OF SESSION:  PT End of Session - 04/04/23 1600     Visit Number 1    Date for PT Re-Evaluation 05/30/23    Progress Note Due on Visit 10    PT Start Time 1600    PT Stop Time 1645    PT Time Calculation (min) 45 min    Activity Tolerance Patient tolerated treatment well    Behavior During Therapy [facility] for tasks assessed/performed             Past Medical History:  Diagnosis Date   ALLERGIC RHINITIS 09/06/2007   ASTHMATIC BRONCHITIS, ACUTE 11/16/2007   HEMORRHOIDS 07/03/2007   HYPERLIPIDEMIA 09/06/2007   on meds   HYPERTENSION 07/03/2007   on meds   KNEE SPRAIN, LEFT 07/30/2008   OBESITY, MORBID 07/03/2007   Other tenosynovitis of hand and wrist 06/11/2010   OTITIS MEDIA, ACUTE, LEFT 11/10/2009   Type II or unspecified type diabetes mellitus without mention of complication, uncontrolled 09/25/2012   on meds   Umbilical hernia    Wheezing 09/09/2009   Past Surgical History:  Procedure Laterality Date   EXTRACORPOREAL SHOCK WAVE LITHOTRIPSY Left 07/28/2020   Procedure: LEFT EXTRACORPOREAL SHOCK WAVE LITHOTRIPSY (ESWL);  Surgeon: Jerilee Field, MD;  Location: [facility];  Service: Urology;  Laterality: Left;   WISDOM TOOTH EXTRACTION     Patient Active Problem List   Diagnosis Date Noted   Acute pain of right shoulder 03/02/2023   Meralgia paraesthetica, left 03/02/2023   Dyspepsia 11/01/2022   Constipation 11/01/2022   Cough 09/30/2022   Toe pain, right 03/28/2022   Renal insufficiency 01/18/2021   Renal stones 01/12/2021   Acute gastroenteritis 09/21/2020   Left flank pain 07/17/2020   Vitamin D deficiency 01/26/2020   Vitamin B12 deficiency 01/26/2020   COVID-19 virus infection 07/10/2019   Umbilical hernia 11/02/2018   Palpitations 06/25/2016   DOE (dyspnea on exertion)  01/21/2015   Sinusitis 11/22/2012   Diabetes (HCC) 09/25/2012   Right knee pain 07/02/2012   Encounter for well adult exam with abnormal findings 09/15/2011   Hyperlipidemia 09/06/2007   Allergic rhinitis 09/06/2007   OBESITY, MORBID 07/03/2007   Essential hypertension 07/03/2007   HEMORRHOIDS 07/03/2007    PCP: [name]  REFERRING PROVIDER: Clementeen Graham, MD  REFERRING DIAG: R shoulder pain  THERAPY DIAG:  Acute pain of right shoulder  Impingement syndrome of right shoulder  Rationale for Evaluation and Treatment: Rehabilitation  ONSET DATE: 8 weeks,   SUBJECTIVE:  SUBJECTIVE STATEMENT: Reports R shoulder actually much improved since he saw MD.  Having hardly any pain now, only when he moves it a certain way.  Hand dominance: Right  PERTINENT HISTORY: Insidious onset R shoulder pain, 8 weeks ago.  Referred to PT by MD.    PAIN:  Are you having pain? Yes: NPRS scale: 0 to 4/10 Pain location: R anterior shoulder and upper arm Pain description: sharp pain , stops when arm positioned differently Aggravating factors: sleeping on R lifting forward, reaching to R hip Relieving factors: avoiding specific motions  PRECAUTIONS: None  WEIGHT BEARING RESTRICTIONS: No  FALLS:  Has patient fallen in last 6 months? No  LIVING ENVIRONMENT: Lives with: lives with their family Lives in: House/apartment   OCCUPATION: Sales job, driving a lot,   PLOF: Independent  PATIENT GOALS:the patient would like to be able to sleep on R side, reach behind his back with R hand  NEXT MD VISIT: in 2 weeks   OBJECTIVE:   DIAGNOSTIC FINDINGS:  Ultrasound noted Subacromial bursitis  PATIENT SURVEYS:  Quick Dash 20% disability  COGNITION: Overall cognitive status: Within functional limits for tasks  assessed     SENSATION: WFL  POSTURE: Rounded B GH jts, protracted B scapulae  UPPER EXTREMITY ROM:  R shoulder abduction 118, flexion 112, IR hand to R gr trochanter hip, ER wnl, all other Rom wnl UPPER EXTREMITY MMT:all MMT WNL B UE's  SHOULDER SPECIAL TESTS: Impingement tests: Hawkins/Kennedy impingement test: positive  and Painful arc test: positive  Rotator cuff assessment: External rotation lag sign: positive , Internal rotation lag sign: positive , and Belly press test: positive   JOINT MOBILITY TESTING:  Decreased AP motion post jt capsule R   PALPATION:  Mild tenderness R supraspinatus insertion R ant / superior GH jt   TODAY'S TREATMENT:                                                                                                                                         DATE: 04/04/23: evaluation, education in anatomy of R shoulder jt, effects of sitting mechanics, repeat positioning and influence on impingement syndrome.instructed in the exercises listed below to perform at home   PATIENT EDUCATION: Education details: POC, goals, Person educated: Patient Education method: Explanation, Demonstration, Tactile cues, and Verbal cues Education comprehension: verbalized understanding, returned demonstration, verbal cues required, and tactile cues required  HOME EXERCISE PROGRAM: Access Code: [facility] URL: [email or web address] Date: 04/04/2023 Prepared by: Manoj Enriquez  Exercises - Circular Shoulder Pendulum with Table Support  - 1 x daily - 7 x weekly - 3 sets - 10 reps - Shoulder External Rotation and Scapular Retraction with Resistance  - 1 x daily - 7 x weekly - 3 sets - 10 reps - Seated Shoulder Scaption Slide at Table Top with Forearm in Neutral  - 1 x daily - 7 x weekly - 3 sets -  10 reps  ASSESSMENT:  CLINICAL IMPRESSION: Patient is a 62 y.o. male who was seen today for physical therapy evaluation and treatment for R shoulder pain. He reports  steady improvement in his Sx since seeing the doctor 2 weeks ago, however still has some AROM limitations R shoulder and + impingement signs.  He is avoiding exacerbating movements R shoulder and avoiding sleeping on R side.  He wanted to attend today's appt and will call back if he declines further .  Instructed him in therex designed to improve his motion restrictions and posture.  OBJECTIVE IMPAIRMENTS: decreased ROM, hypomobility, increased fascial restrictions, impaired flexibility, postural dysfunction, and pain.   ACTIVITY LIMITATIONS: carrying, lifting, bathing, and reach over head  PARTICIPATION LIMITATIONS: cleaning, laundry, and driving  PERSONAL FACTORS: Age, Fitness, Profession, Time since onset of injury/illness/exacerbation, and 1-2 comorbidities: Dm,obesity  are also affecting patient's functional outcome.   REHAB POTENTIAL: Good  CLINICAL DECISION MAKING: Stable/uncomplicated  EVALUATION COMPLEXITY: Low   GOALS: Goals reviewed with patient? Yes  SHORT TERM GOALS: Target date: 2 weeks 04/18/23  I HEP Baseline: Goal status: INITIAL   LONG TERM GOALS: Target date: 05/30/23  Improve AROM for R shoulder elevation to 130 or greater B Baseline:  Goal status: INITIAL  2.  Quick DASH Baseline: 20% Goal status: INITIAL  3.  Improve R shoulder AROM for IR to reach to T 10 Baseline: R greater trochanter Goal status: INITIAL    PLAN:  PT FREQUENCY:  one time visit and patient to call if additional visits needed  PT DURATION: 8 weeks  PLANNED INTERVENTIONS: Therapeutic exercises, Therapeutic activity, Neuromuscular re-education, Balance training, Gait training, Patient/Family education, Self Care, and Joint mobilization  PLAN FOR NEXT SESSION: if pt returns, progress with manual stretching, postural retraining   Early Chars, PT, DPT Board Certified Clinical Specialist in Orthopaedic Physical Therapy 04/04/2023, 5:05 PM

## 2023-04-09 ENCOUNTER — Other Ambulatory Visit: Payer: Self-pay | Admitting: Internal Medicine

## 2023-04-18 ENCOUNTER — Ambulatory Visit: Payer: Managed Care, Other (non HMO) | Admitting: Family Medicine

## 2023-04-25 ENCOUNTER — Other Ambulatory Visit: Payer: Self-pay

## 2023-04-25 ENCOUNTER — Encounter: Payer: Self-pay | Admitting: Internal Medicine

## 2023-04-25 MED ORDER — LISINOPRIL 20 MG PO TABS: 20.0000 mg | ORAL_TABLET | Freq: Every day | ORAL | 1 refills | Status: DC

## 2023-04-25 MED ORDER — ROSUVASTATIN CALCIUM 20 MG PO TABS: 20.0000 mg | ORAL_TABLET | Freq: Every day | ORAL | 3 refills | Status: DC

## 2023-07-25 ENCOUNTER — Other Ambulatory Visit: Payer: Self-pay | Admitting: Internal Medicine

## 2023-08-28 ENCOUNTER — Other Ambulatory Visit: Payer: Self-pay | Admitting: Internal Medicine

## 2023-08-29 ENCOUNTER — Other Ambulatory Visit: Payer: Self-pay

## 2023-09-23 ENCOUNTER — Other Ambulatory Visit: Payer: Self-pay | Admitting: Internal Medicine

## 2023-09-26 ENCOUNTER — Other Ambulatory Visit: Payer: Self-pay

## 2023-10-03 ENCOUNTER — Other Ambulatory Visit: Payer: Self-pay

## 2023-10-03 ENCOUNTER — Other Ambulatory Visit: Payer: Self-pay | Admitting: Internal Medicine

## 2023-10-10 ENCOUNTER — Telehealth (INDEPENDENT_AMBULATORY_CARE_PROVIDER_SITE_OTHER): Payer: Managed Care, Other (non HMO) | Admitting: Family Medicine

## 2023-10-10 ENCOUNTER — Encounter: Payer: Self-pay | Admitting: *Deleted

## 2023-10-10 ENCOUNTER — Encounter: Payer: Self-pay | Admitting: Family Medicine

## 2023-10-10 VITALS — Wt 340.0 lb

## 2023-10-10 DIAGNOSIS — B9689 Other specified bacterial agents as the cause of diseases classified elsewhere: Secondary | ICD-10-CM

## 2023-10-10 DIAGNOSIS — J988 Other specified respiratory disorders: Secondary | ICD-10-CM

## 2023-10-10 MED ORDER — AMOXICILLIN-POT CLAVULANATE 875-125 MG PO TABS: 1.0000 | ORAL_TABLET | Freq: Two times a day (BID) | ORAL | 0 refills | Status: AC

## 2023-10-10 MED ORDER — PROMETHAZINE-DM 6.25-15 MG/5ML PO SYRP: 5.0000 mL | ORAL_SOLUTION | Freq: Four times a day (QID) | ORAL | 0 refills | Status: DC | PRN

## 2023-10-10 NOTE — Progress Notes (Signed)
 Virtual Visit via Video Note  I connected with Martin Lozano on 10/10/23 at 11:00 AM EST by a video enabled telemedicine application and verified that I am speaking with the correct person using two identifiers.  Patient Location: Home Provider Location: Home Office  I discussed the limitations, risks, security, and privacy concerns of performing an evaluation and management service by video and the availability of in person appointments. I also discussed with the patient that there may be a patient responsible charge related to this service. The patient expressed understanding and agreed to proceed.  Subjective: PCP: Martin Lozano ORN, MD  Chief Complaint  Patient presents with   URI    Head congestion, cough, congestion = tan/green , no fever - started Thursday  (Son and wife had this as well) Tried OTC nyquil     Patient complains of cough and head congestion. He denies fever and shortness of breath. Onset of symptoms was 4 days ago, gradually worsening since that time. He is drinking plenty of fluids. Evaluation to date: none. Treatment to date:  Nyquil . He does not smoke. His son and wife have also recently been ill with the same symptoms.    ROS: Per HPI  Current Outpatient Medications:    albuterol  (VENTOLIN  HFA) 108 (90 Base) MCG/ACT inhaler, Inhale 2 puffs into the lungs every 6 (six) hours as needed for wheezing or shortness of breath., Disp: 8 g, Rfl: 0   aspirin  81 MG tablet, Take 81 mg by mouth daily., Disp: , Rfl:    Cholecalciferol (THERA-D 2000) 50 MCG (2000 UT) TABS, 1 tab by mouth once daily, Disp: 30 tablet, Rfl: 99   [employer] VITAMIN B12 1000 MCG tablet, TAKE 1 TABLET BY MOUTH EVERY DAY, Disp: 100 tablet, Rfl: 1   glipiZIDE  (GLUCOTROL  XL) 10 MG 24 hr tablet, TAKE 1 TABLET (10 MG TOTAL) BY MOUTH DAILY WITH BREAKFAST., Disp: 90 tablet, Rfl: 3   lisinopril  (ZESTRIL ) 20 MG tablet, TAKE 1 TABLET BY MOUTH EVERY DAY, Disp: 90 tablet, Rfl: 1   meloxicam  (MOBIC ) 15 MG tablet,  TAKE 1 TABLET BY MOUTH EVERY DAY AS NEEDED FOR PAIN, Disp: 30 tablet, Rfl: 5   metFORMIN  (GLUCOPHAGE ) 500 MG tablet, TAKE 2 TABLETS BY MOUTH IN THE MORNING AND 2 TABLETS IN THE EVENING, Disp: 360 tablet, Rfl: 3   pioglitazone  (ACTOS ) 30 MG tablet, TAKE 1 TABLET BY MOUTH EVERY DAY, Disp: 90 tablet, Rfl: 3   rosuvastatin  (CRESTOR ) 20 MG tablet, Take 1 tablet (20 mg total) by mouth daily., Disp: 90 tablet, Rfl: 3  Observations/Objective: Today's Vitals   10/10/23 0951  Weight: (!) 340 lb (154.2 kg)   Physical Exam Constitutional:      General: He is not in acute distress.    Appearance: Normal appearance. He is not ill-appearing or toxic-appearing.  Eyes:     General: No scleral icterus.       Right eye: No discharge.        Left eye: No discharge.     Conjunctiva/sclera: Conjunctivae normal.  Pulmonary:     Effort: Pulmonary effort is normal. No respiratory distress.  Neurological:     Mental Status: He is alert and oriented to person, place, and time.  Psychiatric:        Mood and Affect: Mood normal.        Behavior: Behavior normal.        Thought Content: Thought content normal.        Judgment: Judgment normal.  Assessment and Plan: 1. Bacterial respiratory infection (Primary) Education provided on URIs. Discussed typical duration and progression of viral illnesses.  Suggested waiting a day or two before starting the antibiotic to see if he starts feeling better indicating a viral infection. Encouraged symptom management including throat lozenges, chloraseptic spray, warm salt water gargles, hot tea/honey, cough syrup (Delsym), Tylenol /Ibuprofen , Vicks, and a humidifier at night.   - amoxicillin -clavulanate (AUGMENTIN ) 875-125 MG tablet; Take 1 tablet by mouth 2 (two) times daily for 7 days.  Dispense: 14 tablet; Refill: 0 - promethazine -dextromethorphan (PROMETHAZINE -DM) 6.25-15 MG/5ML syrup; Take 5 mLs by mouth 4 (four) times daily as needed for cough.  Dispense: 118 mL;  Refill: 0   Follow Up Instructions: Return if symptoms worsen or fail to improve.   I discussed the assessment and treatment plan with the patient. The patient was provided an opportunity to ask questions, and all were answered. The patient agreed with the plan and demonstrated an understanding of the instructions.   The patient was advised to call back or seek an in-person evaluation if the symptoms worsen or if the condition fails to improve as anticipated.  The above assessment and management plan was discussed with the patient. The patient verbalized understanding of and has agreed to the management plan.   Martin JULIANNA Rung, FNP

## 2023-10-10 NOTE — Patient Instructions (Addendum)
 Throat lozenges, chloraseptic spray, warm salt water gargles, hot tea/honey, cough syrup (Delsym), Tylenol/Ibuprofen, Vicks, and a humidifier at night.

## 2023-11-07 ENCOUNTER — Other Ambulatory Visit: Payer: Self-pay | Admitting: Internal Medicine

## 2023-11-07 ENCOUNTER — Other Ambulatory Visit (INDEPENDENT_AMBULATORY_CARE_PROVIDER_SITE_OTHER): Payer: Managed Care, Other (non HMO)

## 2023-11-07 ENCOUNTER — Encounter: Payer: Self-pay | Admitting: Internal Medicine

## 2023-11-07 ENCOUNTER — Ambulatory Visit: Payer: Managed Care, Other (non HMO) | Admitting: Internal Medicine

## 2023-11-07 ENCOUNTER — Ambulatory Visit (INDEPENDENT_AMBULATORY_CARE_PROVIDER_SITE_OTHER)
Admission: RE | Admit: 2023-11-07 | Discharge: 2023-11-07 | Disposition: A | Discharge status: Home / Self Care | Payer: Managed Care, Other (non HMO) | Source: Ambulatory Visit | Attending: Internal Medicine | Admitting: Internal Medicine

## 2023-11-07 VITALS — BP 124/74 | HR 85 | Temp 97.6°F | Ht 71.0 in | Wt 348.0 lb

## 2023-11-07 DIAGNOSIS — E559 Vitamin D deficiency, unspecified: Secondary | ICD-10-CM

## 2023-11-07 DIAGNOSIS — I10 Essential (primary) hypertension: Secondary | ICD-10-CM

## 2023-11-07 DIAGNOSIS — G4733 Obstructive sleep apnea (adult) (pediatric): Secondary | ICD-10-CM | POA: Diagnosis not present

## 2023-11-07 DIAGNOSIS — E7800 Pure hypercholesterolemia, unspecified: Secondary | ICD-10-CM

## 2023-11-07 DIAGNOSIS — Z0000 Encounter for general adult medical examination without abnormal findings: Secondary | ICD-10-CM | POA: Diagnosis not present

## 2023-11-07 DIAGNOSIS — Z125 Encounter for screening for malignant neoplasm of prostate: Secondary | ICD-10-CM

## 2023-11-07 DIAGNOSIS — E1165 Type 2 diabetes mellitus with hyperglycemia: Secondary | ICD-10-CM

## 2023-11-07 DIAGNOSIS — Z1211 Encounter for screening for malignant neoplasm of colon: Secondary | ICD-10-CM

## 2023-11-07 DIAGNOSIS — Z7985 Long-term (current) use of injectable non-insulin antidiabetic drugs: Secondary | ICD-10-CM

## 2023-11-07 DIAGNOSIS — E538 Deficiency of other specified B group vitamins: Secondary | ICD-10-CM

## 2023-11-07 DIAGNOSIS — Z7984 Long term (current) use of oral hypoglycemic drugs: Secondary | ICD-10-CM

## 2023-11-07 DIAGNOSIS — M79672 Pain in left foot: Secondary | ICD-10-CM | POA: Diagnosis not present

## 2023-11-07 DIAGNOSIS — Z0001 Encounter for general adult medical examination with abnormal findings: Secondary | ICD-10-CM

## 2023-11-07 LAB — URINALYSIS, ROUTINE W REFLEX MICROSCOPIC
Bilirubin Urine: NEGATIVE
Hgb urine dipstick: NEGATIVE
Ketones, ur: NEGATIVE
Leukocytes,Ua: NEGATIVE
Nitrite: NEGATIVE
RBC / HPF: NONE SEEN (ref 0–?)
Specific Gravity, Urine: >=1.03 — AB (ref 1.000–1.030)
Total Protein, Urine: NEGATIVE
Urine Glucose: 100 — AB
Urobilinogen, UA: 0.2 (ref 0.0–1.0)
pH: 6 (ref 5.0–8.0)

## 2023-11-07 LAB — CBC WITH DIFFERENTIAL/PLATELET
Basophils Absolute: 0 10*3/uL (ref 0.0–0.1)
Basophils Relative: 0.3 % (ref 0.0–3.0)
Eosinophils Absolute: 0.3 10*3/uL (ref 0.0–0.7)
Eosinophils Relative: 3.1 % (ref 0.0–5.0)
HCT: 41.8 % (ref 39.0–52.0)
Hemoglobin: 13.7 g/dL (ref 13.0–17.0)
Lymphocytes Relative: 33 % (ref 12.0–46.0)
Lymphs Abs: 2.8 10*3/uL (ref 0.7–4.0)
MCHC: 32.9 g/dL (ref 30.0–36.0)
MCV: 91.2 fL (ref 78.0–100.0)
Monocytes Absolute: 0.5 10*3/uL (ref 0.1–1.0)
Monocytes Relative: 5.8 % (ref 3.0–12.0)
Neutro Abs: 4.8 10*3/uL (ref 1.4–7.7)
Neutrophils Relative %: 57.8 % (ref 43.0–77.0)
Platelets: 226 10*3/uL (ref 150.0–400.0)
RBC: 4.58 Mil/uL (ref 4.22–5.81)
RDW: 14.1 % (ref 11.5–15.5)
WBC: 8.4 10*3/uL (ref 4.0–10.5)

## 2023-11-07 LAB — MICROALBUMIN / CREATININE URINE RATIO
Creatinine,U: 172.5 mg/dL
Microalb Creat Ratio: 0.4 mg/g (ref 0.0–30.0)
Microalb, Ur: <0.7 mg/dL (ref 0.0–1.9)

## 2023-11-07 LAB — LIPID PANEL
Cholesterol: 121 mg/dL (ref 0–200)
HDL: 38 mg/dL — ABNORMAL LOW (ref >39.00)
LDL Cholesterol: 55 mg/dL (ref 0–99)
NonHDL: 83.07
Total CHOL/HDL Ratio: 3
Triglycerides: 139 mg/dL (ref 0.0–149.0)
VLDL: 27.8 mg/dL (ref 0.0–40.0)

## 2023-11-07 LAB — BASIC METABOLIC PANEL
BUN: 17 mg/dL (ref 6–23)
CO2: 26 meq/L (ref 19–32)
Calcium: 8.9 mg/dL (ref 8.4–10.5)
Chloride: 103 meq/L (ref 96–112)
Creatinine, Ser: 1.1 mg/dL (ref 0.40–1.50)
GFR: 72.62 mL/min (ref >60.00)
Glucose, Bld: 89 mg/dL (ref 70–99)
Potassium: 4.5 meq/L (ref 3.5–5.1)
Sodium: 138 meq/L (ref 135–145)

## 2023-11-07 LAB — HEPATIC FUNCTION PANEL
ALT: 15 U/L (ref 0–53)
AST: 15 U/L (ref 0–37)
Albumin: 4.1 g/dL (ref 3.5–5.2)
Alkaline Phosphatase: 53 U/L (ref 39–117)
Bilirubin, Direct: 0.1 mg/dL (ref 0.0–0.3)
Total Bilirubin: 0.3 mg/dL (ref 0.2–1.2)
Total Protein: 7 g/dL (ref 6.0–8.3)

## 2023-11-07 LAB — HEMOGLOBIN A1C: Hgb A1c MFr Bld: 5.8 % (ref 4.6–6.5)

## 2023-11-07 LAB — PSA: PSA: 0.41 ng/mL (ref 0.10–4.00)

## 2023-11-07 LAB — VITAMIN B12: Vitamin B-12: 824 pg/mL (ref 211–911)

## 2023-11-07 LAB — VITAMIN D 25 HYDROXY (VIT D DEFICIENCY, FRACTURES): VITD: 60.74 ng/mL (ref 30.00–100.00)

## 2023-11-07 LAB — TSH: TSH: 2.11 u[IU]/mL (ref 0.35–5.50)

## 2023-11-07 MED ORDER — IBUPROFEN 800 MG PO TABS: 800.0000 mg | ORAL_TABLET | Freq: Three times a day (TID) | ORAL | 1 refills | Status: DC | PRN

## 2023-11-07 MED ORDER — TIRZEPATIDE 2.5 MG/0.5ML ~~LOC~~ SOAJ: 2.5000 mg | SUBCUTANEOUS | 11 refills | Status: AC

## 2023-11-07 NOTE — Progress Notes (Signed)
 The test results show that your current treatment is OK, as the tests are stable.  Please continue the same plan.  There is no other need for change of treatment or further evaluation based on these results, at this time.  thanks

## 2023-11-07 NOTE — Progress Notes (Unsigned)
Patient ID: Martin Lozano, male   DOB: [birth year], 62 y.o.   MRN: [id number]         Chief Complaint:: wellness exam and Foot Pain (Bottom of left foot sore since last Monday ) and Diabetes (Has a few questions regarding his diabetes (numbness in arms and legs every once in a while))  , left CTS, hypersomnolence       HPI:  Martin Lozano is a 63 y.o. male here for wellness exam; declines colonoscopy and flu shot but ok for cologuard, for tdap and shingrx at pharmacy, o/w up to date                      Also Pt denies chest pain, increased sob or doe, wheezing, orthopnea, PND, increased LE swelling, palpitations, dizziness or syncope.   Pt denies polydipsia, polyuria, or new focal neuro s/s.    Pt denies fever, wt loss, night sweats, loss of appetite, or other constitutional symptoms  Also stepped on a rock with the plantar distal left foot, now with intermittent severe pain to walking for 1 wk.  Also has left hand paresthesias mild intermittent at times with walking.  Also with concern for OSA, asks for referral for home sleep testing.     Wt Readings from Last 3 Encounters:  11/07/23 (!) 348 lb (157.9 kg)  10/10/23 (!) 340 lb (154.2 kg)  03/07/23 (!) 328 lb (148.8 kg)   BP Readings from Last 3 Encounters:  11/07/23 124/74  03/07/23 130/74  03/02/23 120/78   Immunization History  Administered Date(s) Administered   Influenza Whole 10/06/2010   Influenza,inj,Quad PF,6+ Mos 06/25/2016, 09/16/2017, 07/23/2019   PFIZER(Purple Top)SARS-COV-2 Vaccination 04/11/2020, 05/19/2020   Pneumococcal Conjugate-13 03/10/2017   Pneumococcal Polysaccharide-23 11/22/2018   Td 06/18/2009   Health Maintenance Due  Topic Date Due   Colonoscopy  Never done   Zoster Vaccines- Shingrix (1 of 2) Never done   DTaP/Tdap/Td (2 - Tdap) 06/19/2019      Past Medical History:  Diagnosis Date   ALLERGIC RHINITIS 09/06/2007   ASTHMATIC BRONCHITIS, ACUTE 11/16/2007   HEMORRHOIDS 07/03/2007   HYPERLIPIDEMIA  09/06/2007   on meds   HYPERTENSION 07/03/2007   on meds   KNEE SPRAIN, LEFT 07/30/2008   OBESITY, MORBID 07/03/2007   Other tenosynovitis of hand and wrist 06/11/2010   OTITIS MEDIA, ACUTE, LEFT 11/10/2009   Type II or unspecified type diabetes mellitus without mention of complication, uncontrolled 09/25/2012   on meds   Umbilical hernia    Wheezing 09/09/2009   Past Surgical History:  Procedure Laterality Date   EXTRACORPOREAL SHOCK WAVE LITHOTRIPSY Left 07/28/2020   Procedure: LEFT EXTRACORPOREAL SHOCK WAVE LITHOTRIPSY (ESWL);  Surgeon: Jerilee Field, MD;  Location: [facility];  Service: Urology;  Laterality: Left;   WISDOM TOOTH EXTRACTION      reports that he has never smoked. He has never used smokeless tobacco. He reports that he does not currently use alcohol. He reports that he does not use drugs. family history includes Colon polyps (age of onset: 44) in his brother. No Known Allergies Current Outpatient Medications on File Prior to Visit  Medication Sig Dispense Refill   albuterol (VENTOLIN HFA) 108 (90 Base) MCG/ACT inhaler Inhale 2 puffs into the lungs every 6 (six) hours as needed for wheezing or shortness of breath. 8 g 0   aspirin 81 MG tablet Take 81 mg by mouth daily.     Cholecalciferol (THERA-D 2000) 50 MCG (  2000 UT) TABS 1 tab by mouth once daily 30 tablet 99   [facility]S VITAMIN B12 1000 MCG tablet TAKE 1 TABLET BY MOUTH EVERY DAY 100 tablet 1   glipiZIDE (GLUCOTROL XL) 10 MG 24 hr tablet TAKE 1 TABLET (10 MG TOTAL) BY MOUTH DAILY WITH BREAKFAST. 90 tablet 3   lisinopril (ZESTRIL) 20 MG tablet TAKE 1 TABLET BY MOUTH EVERY DAY 90 tablet 1   meloxicam (MOBIC) 15 MG tablet TAKE 1 TABLET BY MOUTH EVERY DAY AS NEEDED FOR PAIN 30 tablet 5   metFORMIN (GLUCOPHAGE) 500 MG tablet TAKE 2 TABLETS BY MOUTH IN THE MORNING AND 2 TABLETS IN THE EVENING 360 tablet 3   pioglitazone (ACTOS) 30 MG tablet TAKE 1 TABLET BY MOUTH EVERY DAY 90 tablet 3   rosuvastatin  (CRESTOR) 20 MG tablet Take 1 tablet (20 mg total) by mouth daily. 90 tablet 3   No current facility-administered medications on file prior to visit.        ROS:  All others reviewed and negative.  Objective        PE:  BP 124/74 (BP Location: Left Arm, Patient Position: Sitting, Cuff Size: Large)   Pulse 85   Temp 97.6 F (36.4 C) (Temporal)   Ht 5\' 11"  (1.803 m)   Wt (!) 348 lb (157.9 kg)   SpO2 99%   BMI 48.54 kg/m                 Constitutional: Pt appears in NAD               HENT: Head: NCAT.                Right Ear: External ear normal.                 Left Ear: External ear normal.                Eyes: . Pupils are equal, round, and reactive to light. Conjunctivae and EOM are normal               Nose: without d/c or deformity               Neck: Neck supple. Gross normal ROM               Cardiovascular: Normal rate and regular rhythm.                 Pulmonary/Chest: Effort normal and breath sounds without rales or wheezing.                Abd:  Soft, NT, ND, + BS, no organomegaly               Neurological: Pt is alert. At baseline orientation, motor grossly intact               Skin: Skin is warm. No rashes, no other new lesions, LE edema - none               Psychiatric: Pt behavior is normal without agitation   Micro: none  Cardiac tracings I have personally interpreted today:  none  Pertinent Radiological findings (summarize): none   Lab Results  Component Value Date   WBC 8.4 11/07/2023   HGB 13.7 11/07/2023   HCT 41.8 11/07/2023   PLT 226.0 11/07/2023   GLUCOSE 89 11/07/2023   CHOL 121 11/07/2023   TRIG 139.0 11/07/2023   HDL 38.00 (L) 11/07/2023   LDLDIRECT  88.0 11/02/2018   LDLCALC 55 11/07/2023   ALT 15 11/07/2023   AST 15 11/07/2023   NA 138 11/07/2023   K 4.5 11/07/2023   CL 103 11/07/2023   CREATININE 1.10 11/07/2023   BUN 17 11/07/2023   CO2 26 11/07/2023   TSH 2.11 11/07/2023   PSA 0.41 11/07/2023   HGBA1C 5.8 11/07/2023    MICROALBUR <0.7 11/07/2023   Assessment/Plan:  Martin Lozano is a 63 y.o. White or Caucasian [1] male with  has a past medical history of ALLERGIC RHINITIS (09/06/2007), ASTHMATIC BRONCHITIS, ACUTE (11/16/2007), HEMORRHOIDS (07/03/2007), HYPERLIPIDEMIA (09/06/2007), HYPERTENSION (07/03/2007), KNEE SPRAIN, LEFT (07/30/2008), OBESITY, MORBID (07/03/2007), Other tenosynovitis of hand and wrist (06/11/2010), OTITIS MEDIA, ACUTE, LEFT (11/10/2009), Type II or unspecified type diabetes mellitus without mention of complication, uncontrolled (09/25/2012), Umbilical hernia, and Wheezing (09/09/2009).  Encounter for well adult exam with abnormal findings Age and sex appropriate education and counseling updated with regular exercise and diet Referrals for preventative services - declines colonoscopy but for cologuard Immunizations addressed - declines flu shot, for tdap and shingrix at pharmacy Smoking counseling  - none needed Evidence for depression or other mood disorder - none significant Most recent labs reviewed. I have personally reviewed and have noted: 1) the patient's medical and social history 2) The patient's current medications and supplements 3) The patient's height, weight, and BMI have been recorded in the chart   Diabetes [facility]) Lab Results  Component Value Date   HGBA1C 5.8 11/07/2023   With uncontrolled weight, pt to continue current medical treatment glucotrol xl 10 every day, metfomrin 500 mg - 2 bid, actos 30 mg, and start mounjaro 2,5 mg weekly   Essential hypertension BP Readings from Last 3 Encounters:  11/07/23 124/74  03/07/23 130/74  03/02/23 120/78   Stable, pt to continue medical treatment lisinopril 20 qd   Hyperlipidemia Lab Results  Component Value Date   LDLCALC 55 11/07/2023   Stable, pt to continue current statin crestor 20 qd   Left foot pain Post trauma with persistnet pain - for xray lef tfoot, ibuprofen 800 mg prn, refer podiatry  Vitamin  B12 deficiency Lab Results  Component Value Date   VITAMINB12 824 11/07/2023   Stable, cont oral replacement - b12 1000 mcg qd   Vitamin D deficiency Last vitamin D Lab Results  Component Value Date   VD25OH 60.74 11/07/2023   Stable, cont oral replacement   OSA (obstructive sleep apnea) Possible, Also for referral for testing  Followup: No follow-ups on file.  Oliver Barre, MD 11/08/2023 9:15 PM [facility]  Primary Care - [facility]

## 2023-11-07 NOTE — Patient Instructions (Addendum)
Please take all new medication as prescribed - the mounjaro 2.5 mg weekly (and send a message on mychart in close to 1 month if you are doing well for the next higher dose)  Please take all new medication as prescribed - the ibuprofen as needed for pain  Please continue all other medications as before, and refills have been done if requested.  Please have the pharmacy call with any other refills you may need.  Please continue your efforts at being more active, low cholesterol diet, and weight control.  You are otherwise up to date with prevention measures today.  Please keep your appointments with your specialists as you may have planned  You will be contacted regarding the referral for: Pulmonary for sleep apnea, and Podiatry for the left foot pain  You will be contacted regarding the referral for: Cologuard - to be sent to your house  Please go to the XRAY Department in the first floor for the x-ray testing for the left foot  Please go to the LAB at the blood drawing area for the tests to be done  You will be contacted by phone if any changes need to be made immediately.  Otherwise, you will receive a letter about your results with an explanation, but please check with MyChart first.

## 2023-11-08 ENCOUNTER — Encounter: Payer: Self-pay | Admitting: Internal Medicine

## 2023-11-08 DIAGNOSIS — G4733 Obstructive sleep apnea (adult) (pediatric): Secondary | ICD-10-CM | POA: Insufficient documentation

## 2023-11-08 NOTE — Assessment & Plan Note (Signed)
Lab Results  Component Value Date   VITAMINB12 824 11/07/2023   Stable, cont oral replacement - b12 1000 mcg qd

## 2023-11-08 NOTE — Assessment & Plan Note (Signed)
BP Readings from Last 3 Encounters:  11/07/23 124/74  03/07/23 130/74  03/02/23 120/78   Stable, pt to continue medical treatment lisinopril 20 qd

## 2023-11-08 NOTE — Assessment & Plan Note (Signed)
Post trauma with persistnet pain - for xray lef tfoot, ibuprofen 800 mg prn, refer podiatry

## 2023-11-08 NOTE — Assessment & Plan Note (Signed)
Possible, Also for referral for testing

## 2023-11-08 NOTE — Assessment & Plan Note (Signed)
 Age and sex appropriate education and counseling updated with regular exercise and diet Referrals for preventative services - declines colonoscopy but for cologuard Immunizations addressed - declines flu shot, for tdap and shingrix at pharmacy Smoking counseling  - none needed Evidence for depression or other mood disorder - none significant Most recent labs reviewed. I have personally reviewed and have noted: 1) the patient's medical and social history 2) The patient's current medications and supplements 3) The patient's height, weight, and BMI have been recorded in the chart

## 2023-11-08 NOTE — Assessment & Plan Note (Addendum)
Lab Results  Component Value Date   HGBA1C 5.8 11/07/2023   With uncontrolled weight, pt to continue current medical treatment glucotrol xl 10 every day, metfomrin 500 mg - 2 bid, actos 30 mg, and start mounjaro 2,5 mg weekly

## 2023-11-08 NOTE — Assessment & Plan Note (Signed)
Last vitamin D Lab Results  Component Value Date   VD25OH 60.74 11/07/2023   Stable, cont oral replacement

## 2023-11-08 NOTE — Assessment & Plan Note (Signed)
Lab Results  Component Value Date   LDLCALC 55 11/07/2023   Stable, pt to continue current statin crestor 20 qd

## 2023-11-09 ENCOUNTER — Encounter: Payer: Self-pay | Admitting: Internal Medicine

## 2024-01-10 ENCOUNTER — Other Ambulatory Visit: Payer: Self-pay

## 2024-01-10 ENCOUNTER — Encounter: Payer: Self-pay | Admitting: Internal Medicine

## 2024-01-10 MED ORDER — IBUPROFEN 800 MG PO TABS: 800.0000 mg | ORAL_TABLET | Freq: Three times a day (TID) | ORAL | 1 refills | Status: DC | PRN

## 2024-01-30 ENCOUNTER — Encounter: Payer: Self-pay | Admitting: Internal Medicine

## 2024-01-30 ENCOUNTER — Ambulatory Visit: Admitting: Internal Medicine

## 2024-01-30 ENCOUNTER — Ambulatory Visit: Payer: Self-pay

## 2024-01-30 VITALS — BP 128/76 | HR 105 | Temp 99.3°F | Ht 71.0 in | Wt 344.0 lb

## 2024-01-30 DIAGNOSIS — Z7984 Long term (current) use of oral hypoglycemic drugs: Secondary | ICD-10-CM

## 2024-01-30 DIAGNOSIS — E1165 Type 2 diabetes mellitus with hyperglycemia: Secondary | ICD-10-CM | POA: Diagnosis not present

## 2024-01-30 DIAGNOSIS — J329 Chronic sinusitis, unspecified: Secondary | ICD-10-CM | POA: Diagnosis not present

## 2024-01-30 DIAGNOSIS — E559 Vitamin D deficiency, unspecified: Secondary | ICD-10-CM

## 2024-01-30 DIAGNOSIS — J309 Allergic rhinitis, unspecified: Secondary | ICD-10-CM

## 2024-01-30 DIAGNOSIS — E538 Deficiency of other specified B group vitamins: Secondary | ICD-10-CM

## 2024-01-30 DIAGNOSIS — I10 Essential (primary) hypertension: Secondary | ICD-10-CM

## 2024-01-30 DIAGNOSIS — Z7985 Long-term (current) use of injectable non-insulin antidiabetic drugs: Secondary | ICD-10-CM

## 2024-01-30 MED ORDER — HYDROCODONE BIT-HOMATROP MBR 5-1.5 MG/5ML PO SOLN
5.0000 mL | Freq: Four times a day (QID) | ORAL | 0 refills | Status: AC | PRN
Start: 2024-01-30 — End: 2024-02-09

## 2024-01-30 MED ORDER — DOXYCYCLINE HYCLATE 100 MG PO TABS: 100.0000 mg | ORAL_TABLET | Freq: Two times a day (BID) | ORAL | 0 refills | Status: AC

## 2024-01-30 NOTE — Telephone Encounter (Signed)
  Chief Complaint: Cough  Symptoms: Fever 99.8, Nasal Congestion, headaches  Frequency: Acute  Pertinent Negatives: Patient denies chest pain, shortness of breath, dizziness,   Disposition: [] ED /[] Urgent Care (no appt availability in office) / [x] Appointment(In office/virtual)/ []  [facility]/ [] Home Care/ [] Refused Recommended Disposition /[] [facility]/ []  Follow-up with PCP Additional Notes: RM is being triaged for a productive cough, Patient has not tested for COVID or the Flu. The patient reports headaches, nasal congestion, and coughing up green sputum. Sometimes has mild dyspnea after extended coughing duration. In office appointment made for today.   Reason for Disposition . Fever present > 3 days (72 hours)  Answer Assessment - Initial Assessment Questions 1. ONSET: "When did the cough begin?"      Saturday  2. SEVERITY: "How bad is the cough today?"      Comes and goes  3. SPUTUM: "Describe the color of your sputum" (none, dry cough; clear, white, yellow, green)     Green  4. HEMOPTYSIS: "Are you coughing up any blood?" If so ask: "How much?" (flecks, streaks, tablespoons, etc.)     No  5. DIFFICULTY BREATHING: "Are you having difficulty breathing?" If Yes, ask: "How bad is it?" (e.g., mild, moderate, severe)    - MILD: No SOB at rest, mild SOB with walking, speaks normally in sentences, can lie down, no retractions, pulse < 100.    - MODERATE: SOB at rest, SOB with minimal exertion and prefers to sit, cannot lie down flat, speaks in phrases, mild retractions, audible wheezing, pulse 100-120.    - SEVERE: Very SOB at rest, speaks in single words, struggling to breathe, sitting hunched forward, retractions, pulse > 120      Mild  6. FEVER: "Do you have a fever?" If Yes, ask: "What is your temperature, how was it measured, and when did it start?"     99.8  7. CARDIAC HISTORY: "Do you have any history of heart disease?" (e.g., heart attack,  congestive heart failure)      No  8. LUNG HISTORY: "Do you have any history of lung disease?"  (e.g., pulmonary embolus, asthma, emphysema)     No  9. PE RISK FACTORS: "Do you have a history of blood clots?" (or: recent major surgery, recent prolonged travel, bedridden)     No  10. OTHER SYMPTOMS: "Do you have any other symptoms?" (e.g., runny nose, wheezing, chest pain)       Nasal Congestion  12. TRAVEL: "Have you traveled out of the country in the last month?" (e.g., travel history, exposures)       No, not completely sure.  Protocols used: Cough - Acute Productive-A-AH

## 2024-01-30 NOTE — Assessment & Plan Note (Signed)
 Lab Results  Component Value Date   VITAMINB12 824 11/07/2023   Stable, cont oral replacement - b12 1000 mcg qd

## 2024-01-30 NOTE — Patient Instructions (Addendum)
 Please take all new medication as prescribed - the antibiotic, and cough medicine as needed  Please continue all other medications as before, and refills have been done if requested.  Please have the pharmacy call with any other refills you may need.  Please keep your appointments with your specialists as you may have planned

## 2024-01-30 NOTE — Assessment & Plan Note (Signed)
Mild to mod, for antibx course doxycycline 100 bid, cough med prn,,  to f/u any worsening symptoms or concerns  

## 2024-01-30 NOTE — Assessment & Plan Note (Signed)
 Mild, for otc allegra prn,,  to f/u any worsening symptoms or concerns

## 2024-01-30 NOTE — Assessment & Plan Note (Signed)
 Last vitamin D Lab Results  Component Value Date   VD25OH 60.74 11/07/2023   Stable, cont oral replacement

## 2024-01-30 NOTE — Assessment & Plan Note (Signed)
 BP Readings from Last 3 Encounters:  01/30/24 128/76  11/07/23 124/74  03/07/23 130/74   Stable, pt to continue medical treatment lisinopril  20 qd

## 2024-01-30 NOTE — Progress Notes (Signed)
 Patient ID: Martin Lozano, male   DOB: 1961-06-23, 63 y.o.   MRN: [id number]        Chief Complaint: follow up sinusitis, htn, dm, low vit d and b12       HPI:  Martin Lozano is a 63 y.o. male  Here with 2-3 days acute onset fever, facial pain, pressure, headache, general weakness and malaise, and greenish d/c, with mild ST and cough, but pt denies chest pain, wheezing, increased sob or doe, orthopnea, PND, increased LE swelling, palpitations, dizziness or syncope.   Pt denies polydipsia, polyuria, or new focal neuro s/s.    Pt denies recent wt loss, night sweats, loss of appetite, or other constitutional symptoms  Does have several wks ongoing nasal allergy symptoms with clearish congestion, itch and sneezing, without fever, pain, ST, cough, swelling or wheezing.        Wt Readings from Last 3 Encounters:  01/30/24 (!) 344 lb (156 kg)  11/07/23 (!) 348 lb (157.9 kg)  10/10/23 (!) 340 lb (154.2 kg)   BP Readings from Last 3 Encounters:  01/30/24 128/76  11/07/23 124/74  03/07/23 130/74         Past Medical History:  Diagnosis Date   ALLERGIC RHINITIS 09/06/2007   ASTHMATIC BRONCHITIS, ACUTE 11/16/2007   HEMORRHOIDS 07/03/2007   HYPERLIPIDEMIA 09/06/2007   on meds   HYPERTENSION 07/03/2007   on meds   KNEE SPRAIN, LEFT 07/30/2008   OBESITY, MORBID 07/03/2007   Other tenosynovitis of hand and wrist 06/11/2010   OTITIS MEDIA, ACUTE, LEFT 11/10/2009   Type II or unspecified type diabetes mellitus without mention of complication, uncontrolled 09/25/2012   on meds   Umbilical hernia    Wheezing 09/09/2009   Past Surgical History:  Procedure Laterality Date   EXTRACORPOREAL SHOCK WAVE LITHOTRIPSY Left 07/28/2020   Procedure: LEFT EXTRACORPOREAL SHOCK WAVE LITHOTRIPSY (ESWL);  Surgeon: Christina Coyer, MD;  Location: [facility];  Service: Urology;  Laterality: Left;   WISDOM TOOTH EXTRACTION      reports that he has never smoked. He has never used smokeless  tobacco. He reports that he does not currently use alcohol. He reports that he does not use drugs. family history includes Colon polyps (age of onset: 5) in his brother. No Known Allergies Current Outpatient Medications on File Prior to Visit  Medication Sig Dispense Refill   albuterol  (VENTOLIN  HFA) 108 (90 Base) MCG/ACT inhaler Inhale 2 puffs into the lungs every 6 (six) hours as needed for wheezing or shortness of breath. 8 g 0   aspirin  81 MG tablet Take 81 mg by mouth daily.     Cholecalciferol (THERA-D 2000) 50 MCG (2000 UT) TABS 1 tab by mouth once daily 30 tablet 99   [facility]S VITAMIN B12 1000 MCG tablet TAKE 1 TABLET BY MOUTH EVERY DAY 100 tablet 1   glipiZIDE  (GLUCOTROL  XL) 10 MG 24 hr tablet TAKE 1 TABLET (10 MG TOTAL) BY MOUTH DAILY WITH BREAKFAST. 90 tablet 3   ibuprofen  (ADVIL ) 800 MG tablet Take 1 tablet (800 mg total) by mouth every 8 (eight) hours as needed. 40 tablet 1   lisinopril  (ZESTRIL ) 20 MG tablet TAKE 1 TABLET BY MOUTH EVERY DAY 90 tablet 1   meloxicam  (MOBIC ) 15 MG tablet TAKE 1 TABLET BY MOUTH EVERY DAY AS NEEDED FOR PAIN 30 tablet 5   metFORMIN  (GLUCOPHAGE ) 500 MG tablet TAKE 2 TABLETS BY MOUTH IN THE MORNING AND 2 TABLETS IN THE EVENING 360 tablet 3  oxyCODONE-acetaminophen  (PERCOCET/ROXICET) 5-325 MG tablet Take 1 tablet by mouth every 6 (six) hours as needed.     pioglitazone  (ACTOS ) 30 MG tablet TAKE 1 TABLET BY MOUTH EVERY DAY 90 tablet 3   rosuvastatin  (CRESTOR ) 20 MG tablet Take 1 tablet (20 mg total) by mouth daily. 90 tablet 3   tamsulosin  (FLOMAX ) 0.4 MG CAPS capsule Take by mouth.     tirzepatide (MOUNJARO) 2.5 MG/0.5ML Pen Inject 2.5 mg into the skin once a week. E11.9 2 mL 11   No current facility-administered medications on file prior to visit.        ROS:  All others reviewed and negative.  Objective        PE:  BP 128/76 (BP Location: Right Arm, Patient Position: Sitting, Cuff Size: Normal)   Pulse (!) 105   Temp 99.3 F (37.4 C) (Oral)   Ht 5'  11" (1.803 m)   Wt (!) 344 lb (156 kg)   SpO2 98%   BMI 47.98 kg/m                 Constitutional: Pt appears mild ill               HENT: Head: NCAT.                Right Ear: External ear normal.                 Left Ear: External ear normal. Bilat tm's with mild erythema.  Max sinus areas mild tender.  Pharynx with mild erythema, no exudate                Eyes: . Pupils are equal, round, and reactive to light. Conjunctivae and EOM are normal               Nose: without d/c or deformity               Neck: Neck supple. Gross normal ROM               Cardiovascular: Normal rate and regular rhythm.                 Pulmonary/Chest: Effort normal and breath sounds without rales or wheezing               Neurological: Pt is alert. At baseline orientation, motor grossly intact               Skin: Skin is warm. No rashes, no other new lesions, LE edema - none               Psychiatric: Pt behavior is normal without agitation   Micro: none  Cardiac tracings I have personally interpreted today:  none  Pertinent Radiological findings (summarize): none   Lab Results  Component Value Date   WBC 8.4 11/07/2023   HGB 13.7 11/07/2023   HCT 41.8 11/07/2023   PLT 226.0 11/07/2023   GLUCOSE 89 11/07/2023   CHOL 121 11/07/2023   TRIG 139.0 11/07/2023   HDL 38.00 (L) 11/07/2023   LDLDIRECT 88.0 11/02/2018   LDLCALC 55 11/07/2023   ALT 15 11/07/2023   AST 15 11/07/2023   NA 138 11/07/2023   K 4.5 11/07/2023   CL 103 11/07/2023   CREATININE 1.10 11/07/2023   BUN 17 11/07/2023   CO2 26 11/07/2023   TSH 2.11 11/07/2023   PSA 0.41 11/07/2023   HGBA1C 5.8 11/07/2023   MICROALBUR <0.7 11/07/2023   Assessment/Plan:  Martin Lozano is a 63 y.o. White or Caucasian [1] male with  has a past medical history of ALLERGIC RHINITIS (09/06/2007), ASTHMATIC BRONCHITIS, ACUTE (11/16/2007), HEMORRHOIDS (07/03/2007), HYPERLIPIDEMIA (09/06/2007), HYPERTENSION (07/03/2007), KNEE SPRAIN, LEFT (07/30/2008),  OBESITY, MORBID (07/03/2007), Other tenosynovitis of hand and wrist (06/11/2010), OTITIS MEDIA, ACUTE, LEFT (11/10/2009), Type II or unspecified type diabetes mellitus without mention of complication, uncontrolled (09/25/2012), Umbilical hernia, and Wheezing (09/09/2009).  Essential hypertension BP Readings from Last 3 Encounters:  01/30/24 128/76  11/07/23 124/74  03/07/23 130/74   Stable, pt to continue medical treatment lisinopril  20 qd   Diabetes ([facility]) Lab Results  Component Value Date   HGBA1C 5.8 11/07/2023   Stable, pt to continue current medical treatment glipizide10 every day, metfomin 1000 bid, actos  30, mounjaro 2.5 mg weekly   Vitamin D  deficiency Last vitamin D  Lab Results  Component Value Date   VD25OH 60.74 11/07/2023   Stable, cont oral replacement   Vitamin B12 deficiency Lab Results  Component Value Date   VITAMINB12 824 11/07/2023   Stable, cont oral replacement - b12 1000 mcg qd   Sinusitis Mild to mod, for antibx course doxycycline  100 bid, cough med prn,,  to f/u any worsening symptoms or concerns  Allergic rhinitis Mild, for otc allegra prn,,  to f/u any worsening symptoms or concerns  Followup: Return if symptoms worsen or fail to improve.  Rosalia Colonel, MD 01/30/2024 5:02 PM [facility]  Primary Care - [facility]

## 2024-01-30 NOTE — Assessment & Plan Note (Signed)
 Lab Results  Component Value Date   HGBA1C 5.8 11/07/2023   Stable, pt to continue current medical treatment glipizide10 every day, metfomin 1000 bid, actos  30, mounjaro 2.5 mg weekly

## 2024-02-03 ENCOUNTER — Telehealth: Payer: Self-pay | Admitting: Internal Medicine

## 2024-02-03 MED ORDER — PREDNISONE 10 MG PO TABS: ORAL_TABLET | ORAL | 0 refills | Status: DC

## 2024-02-03 NOTE — Telephone Encounter (Signed)
 Copied from CRM [id number]. Topic: Clinical - Medical Advice >> Feb 03, 2024 10:47 AM [place] J wrote: Reason for CRM: Patient is still having the same symptoms has he did when he came in Monday. The antibiotic doxycycline  (VIBRA -TABS) 100 MG tablet is not working and the patient would like to know what he can do.

## 2024-02-03 NOTE — Telephone Encounter (Signed)
 See below

## 2024-02-03 NOTE — Telephone Encounter (Signed)
 Ok to add prednisone  taper - I will send    thanks

## 2024-03-14 ENCOUNTER — Other Ambulatory Visit: Payer: Self-pay

## 2024-03-14 ENCOUNTER — Other Ambulatory Visit: Payer: Self-pay | Admitting: Internal Medicine

## 2024-03-16 ENCOUNTER — Other Ambulatory Visit: Payer: Self-pay | Admitting: Internal Medicine

## 2024-06-18 ENCOUNTER — Ambulatory Visit: Admitting: Internal Medicine

## 2024-06-18 ENCOUNTER — Encounter: Payer: Self-pay | Admitting: Internal Medicine

## 2024-06-18 VITALS — BP 148/82 | HR 58 | Temp 98.2°F | Ht 71.0 in | Wt 359.0 lb

## 2024-06-18 DIAGNOSIS — Z7984 Long term (current) use of oral hypoglycemic drugs: Secondary | ICD-10-CM

## 2024-06-18 DIAGNOSIS — R062 Wheezing: Secondary | ICD-10-CM

## 2024-06-18 DIAGNOSIS — R058 Other specified cough: Secondary | ICD-10-CM

## 2024-06-18 DIAGNOSIS — E538 Deficiency of other specified B group vitamins: Secondary | ICD-10-CM

## 2024-06-18 DIAGNOSIS — E559 Vitamin D deficiency, unspecified: Secondary | ICD-10-CM

## 2024-06-18 DIAGNOSIS — I10 Essential (primary) hypertension: Secondary | ICD-10-CM | POA: Diagnosis not present

## 2024-06-18 DIAGNOSIS — E1165 Type 2 diabetes mellitus with hyperglycemia: Secondary | ICD-10-CM | POA: Diagnosis not present

## 2024-06-18 LAB — POCT INFLUENZA A/B
Influenza A, POC: NEGATIVE
Influenza B, POC: NEGATIVE

## 2024-06-18 LAB — POC COVID19 BINAXNOW: SARS Coronavirus 2 Ag: NEGATIVE

## 2024-06-18 MED ORDER — HYDROCODONE BIT-HOMATROP MBR 5-1.5 MG/5ML PO SOLN: 5.0000 mL | Freq: Four times a day (QID) | ORAL | 0 refills | Status: AC | PRN

## 2024-06-18 MED ORDER — PREDNISONE 10 MG PO TABS: ORAL_TABLET | ORAL | 0 refills | Status: AC

## 2024-06-18 MED ORDER — AZITHROMYCIN 250 MG PO TABS: ORAL_TABLET | ORAL | 1 refills | Status: AC

## 2024-06-18 MED ORDER — ALBUTEROL SULFATE HFA 108 (90 BASE) MCG/ACT IN AERS: 2.0000 | INHALATION_SPRAY | Freq: Four times a day (QID) | RESPIRATORY_TRACT | 0 refills | Status: AC | PRN

## 2024-06-18 NOTE — Patient Instructions (Signed)
 Your COVID and Flu testing is negative  Please take all new medication as prescribed  - the antibiotic, cough medicine, prednisone , and inhaler as needed  Please continue all other medications as before, and refills have been done if requested.  Please have the pharmacy call with any other refills you may need.  Please continue your efforts at being more active, low cholesterol diet, and weight control.  Please keep your appointments with your specialists as you may have planned

## 2024-06-18 NOTE — Assessment & Plan Note (Signed)
 Mild to mod, c/w  bronchitis vs pna, declines cxr,  for antibx course zpack, cough med prn,  to f/u any worsening symptoms or concerns

## 2024-06-18 NOTE — Assessment & Plan Note (Signed)
 BP Readings from Last 3 Encounters:  06/18/24 (!) 148/82  01/30/24 128/76  11/07/23 124/74   Uncontrolled but likely reactive, pt to continue medical treatment lisinopril  20 every day, decliens other change

## 2024-06-18 NOTE — Assessment & Plan Note (Signed)
 Mild to mod, for prednisone taper, inhaler prn,  to f/u any worsening symptoms or concerns

## 2024-06-18 NOTE — Assessment & Plan Note (Signed)
 Last vitamin D Lab Results  Component Value Date   VD25OH 60.74 11/07/2023   Stable, cont oral replacement

## 2024-06-18 NOTE — Assessment & Plan Note (Signed)
 Lab Results  Component Value Date   VITAMINB12 824 11/07/2023   Stable, cont oral replacement - b12 1000 mcg qd

## 2024-06-18 NOTE — Progress Notes (Signed)
 Patient ID: Martin Lozano, male   DOB: Sep 18, 1961, 63 y.o.   MRN: [id number]        Chief Complaint: follow up prod cough, wheezing with sob, dm, htn, low b12 and D       HPI:  Martin Lozano is a 63 y.o. male Here with acute onset mild to mod 2-3 days ST, HA, general weakness and malaise, with prod cough greenish sputum, but Pt denies chest pain, increased sob or doe, wheezing, orthopnea, PND, increased LE swelling, palpitations, dizziness or syncope except for mild wheezing sob starting this am on waking up.   Pt denies polydipsia, polyuria, or new focal neuro s/s.    Pt denies fever, wt loss, night sweats, loss of appetite, or other constitutional symptoms        Wt Readings from Last 3 Encounters:  06/18/24 (!) 359 lb (162.8 kg)  01/30/24 (!) 344 lb (156 kg)  11/07/23 (!) 348 lb (157.9 kg)   BP Readings from Last 3 Encounters:  06/18/24 (!) 148/82  01/30/24 128/76  11/07/23 124/74         Past Medical History:  Diagnosis Date   ALLERGIC RHINITIS 09/06/2007   ASTHMATIC BRONCHITIS, ACUTE 11/16/2007   HEMORRHOIDS 07/03/2007   HYPERLIPIDEMIA 09/06/2007   on meds   HYPERTENSION 07/03/2007   on meds   KNEE SPRAIN, LEFT 07/30/2008   OBESITY, MORBID 07/03/2007   Other tenosynovitis of hand and wrist 06/11/2010   OTITIS MEDIA, ACUTE, LEFT 11/10/2009   Type II or unspecified type diabetes mellitus without mention of complication, uncontrolled 09/25/2012   on meds   Umbilical hernia    Wheezing 09/09/2009   Past Surgical History:  Procedure Laterality Date   EXTRACORPOREAL SHOCK WAVE LITHOTRIPSY Left 07/28/2020   Procedure: LEFT EXTRACORPOREAL SHOCK WAVE LITHOTRIPSY (ESWL);  Surgeon: Nieves Cough, MD;  Location: [facility];  Service: Urology;  Laterality: Left;   WISDOM TOOTH EXTRACTION      reports that he has never smoked. He has never used smokeless tobacco. He reports that he does not currently use alcohol. He reports that he does not use drugs. family  history includes Colon polyps (age of onset: 12) in his brother. No Known Allergies Current Outpatient Medications on File Prior to Visit  Medication Sig Dispense Refill   aspirin  81 MG tablet Take 81 mg by mouth daily.     Cholecalciferol (THERA-D 2000) 50 MCG (2000 UT) TABS 1 tab by mouth once daily 30 tablet 99   [facility] VITAMIN B12 1000 MCG tablet TAKE 1 TABLET BY MOUTH EVERY DAY 100 tablet 1   doxycycline  (VIBRA -TABS) 100 MG tablet Take 1 tablet (100 mg total) by mouth 2 (two) times daily. 20 tablet 0   glipiZIDE  (GLUCOTROL  XL) 10 MG 24 hr tablet TAKE 1 TABLET (10 MG TOTAL) BY MOUTH DAILY WITH BREAKFAST. 90 tablet 3   ibuprofen  (ADVIL ) 800 MG tablet Take 1 tablet (800 mg total) by mouth every 8 (eight) hours as needed. 40 tablet 1   lisinopril  (ZESTRIL ) 20 MG tablet TAKE 1 TABLET BY MOUTH EVERY DAY 90 tablet 1   meloxicam  (MOBIC ) 15 MG tablet TAKE 1 TABLET BY MOUTH EVERY DAY AS NEEDED FOR PAIN 30 tablet 5   metFORMIN  (GLUCOPHAGE ) 500 MG tablet TAKE 2 TABLETS BY MOUTH IN THE MORNING AND 2 TABLETS IN THE EVENING 360 tablet 3   oxyCODONE-acetaminophen  (PERCOCET/ROXICET) 5-325 MG tablet Take 1 tablet by mouth every 6 (six) hours as needed.  pioglitazone  (ACTOS ) 30 MG tablet TAKE 1 TABLET BY MOUTH EVERY DAY 90 tablet 3   rosuvastatin  (CRESTOR ) 20 MG tablet TAKE 1 TABLET BY MOUTH EVERY DAY 90 tablet 3   tamsulosin  (FLOMAX ) 0.4 MG CAPS capsule Take by mouth.     tirzepatide  (MOUNJARO ) 2.5 MG/0.5ML Pen Inject 2.5 mg into the skin once a week. E11.9 2 mL 11   No current facility-administered medications on file prior to visit.        ROS:  All others reviewed and negative.  Objective        PE:  BP (!) 148/82   Pulse (!) 58   Temp 98.2 F (36.8 C)   Ht 5' 11 (1.803 m)   Wt (!) 359 lb (162.8 kg)   SpO2 99%   BMI 50.07 kg/m                 Constitutional: Pt appears mild ill               HENT: Head: NCAT.                Right Ear: External ear normal.                 Left Ear:  External ear normal. Bilat tm's with mild erythema.  Max sinus areas mild tender.  Pharynx with mild erythema, no exudate               Eyes: . Pupils are equal, round, and reactive to light. Conjunctivae and EOM are normal               Nose: without d/c or deformity               Neck: Neck supple. Gross normal ROM               Cardiovascular: Normal rate and regular rhythm.                 Pulmonary/Chest: Effort normal and breath sounds decreased without rales but with few mild wheezing.                               Neurological: Pt is alert. At baseline orientation, motor grossly intact               Skin: Skin is warm. No rashes, no other new lesions, LE edema - none               Psychiatric: Pt behavior is normal without agitation   Micro: none  Cardiac tracings I have personally interpreted today:  none  Pertinent Radiological findings (summarize): none   Lab Results  Component Value Date   WBC 8.4 11/07/2023   HGB 13.7 11/07/2023   HCT 41.8 11/07/2023   PLT 226.0 11/07/2023   GLUCOSE 89 11/07/2023   CHOL 121 11/07/2023   TRIG 139.0 11/07/2023   HDL 38.00 (L) 11/07/2023   LDLDIRECT 88.0 11/02/2018   LDLCALC 55 11/07/2023   ALT 15 11/07/2023   AST 15 11/07/2023   NA 138 11/07/2023   K 4.5 11/07/2023   CL 103 11/07/2023   CREATININE 1.10 11/07/2023   BUN 17 11/07/2023   CO2 26 11/07/2023   TSH 2.11 11/07/2023   PSA 0.41 11/07/2023   HGBA1C 5.8 11/07/2023   POCT - Flu - neg  COVID - neg  Assessment/Plan:  Martin Lozano is a 62 y.o. White or Caucasian [1] male with  has a past medical history of ALLERGIC RHINITIS (09/06/2007), ASTHMATIC BRONCHITIS, ACUTE (11/16/2007), HEMORRHOIDS (07/03/2007), HYPERLIPIDEMIA (09/06/2007), HYPERTENSION (07/03/2007), KNEE SPRAIN, LEFT (07/30/2008), OBESITY, MORBID (07/03/2007), Other tenosynovitis of hand and wrist (06/11/2010), OTITIS MEDIA, ACUTE, LEFT (11/10/2009), Type II or unspecified type diabetes mellitus  without mention of complication, uncontrolled (09/25/2012), Umbilical hernia, and Wheezing (09/09/2009).  Diabetes (HCC) Lab Results  Component Value Date   HGBA1C 5.8 11/07/2023   Stable, pt to continue current medical treatment glucotrol  xl 10 every day, metformin  500 mg - 2 bid, actos  30 mg, mounjaro  2.5 mg weekly   Essential hypertension BP Readings from Last 3 Encounters:  06/18/24 (!) 148/82  01/30/24 128/76  11/07/23 124/74   Uncontrolled but likely reactive, pt to continue medical treatment lisinopril  20 every day, decliens other change   Productive cough Mild to mod, c/w bronchitis vs pna, declines cxr,  for antibx course zpack, cough med prn,  to f/u any worsening symptoms or concerns  Vitamin B12 deficiency Lab Results  Component Value Date   VITAMINB12 824 11/07/2023   Stable, cont oral replacement - b12 1000 mcg qd   Vitamin D  deficiency Last vitamin D  Lab Results  Component Value Date   VD25OH 60.74 11/07/2023   Stable, cont oral replacement   Wheezing Mild to mod, for prednisone  taper, inhaler prn,  to f/u any worsening symptoms or concerns  Followup: Return if symptoms worsen or fail to improve.  Lynwood Rush, MD 06/18/2024 7:23 PM [facility]

## 2024-06-18 NOTE — Assessment & Plan Note (Addendum)
 Lab Results  Component Value Date   HGBA1C 5.8 11/07/2023   Stable, pt to continue current medical treatment glucotrol  xl 10 every day, metformin  500 mg - 2 bid, actos  30 mg, mounjaro  2.5 mg weekly

## 2024-07-06 ENCOUNTER — Encounter: Payer: Self-pay | Admitting: Internal Medicine

## 2024-07-06 ENCOUNTER — Other Ambulatory Visit: Payer: Self-pay

## 2024-07-06 LAB — OPHTHALMOLOGY REPORT-SCANNED

## 2024-07-06 MED ORDER — IBUPROFEN 800 MG PO TABS: 800.0000 mg | ORAL_TABLET | Freq: Three times a day (TID) | ORAL | 1 refills | Status: AC | PRN

## 2024-09-09 ENCOUNTER — Other Ambulatory Visit: Payer: Self-pay | Admitting: Internal Medicine

## 2024-09-18 ENCOUNTER — Ambulatory Visit: Admitting: Internal Medicine

## 2024-09-18 ENCOUNTER — Encounter: Payer: Self-pay | Admitting: Internal Medicine

## 2024-09-18 VITALS — BP 130/78 | HR 77 | Temp 98.8°F | Ht 71.0 in | Wt 360.0 lb

## 2024-09-18 DIAGNOSIS — E538 Deficiency of other specified B group vitamins: Secondary | ICD-10-CM | POA: Diagnosis not present

## 2024-09-18 DIAGNOSIS — E559 Vitamin D deficiency, unspecified: Secondary | ICD-10-CM

## 2024-09-18 DIAGNOSIS — J329 Chronic sinusitis, unspecified: Secondary | ICD-10-CM

## 2024-09-18 DIAGNOSIS — Z7984 Long term (current) use of oral hypoglycemic drugs: Secondary | ICD-10-CM

## 2024-09-18 DIAGNOSIS — E1165 Type 2 diabetes mellitus with hyperglycemia: Secondary | ICD-10-CM | POA: Diagnosis not present

## 2024-09-18 DIAGNOSIS — Z7985 Long-term (current) use of injectable non-insulin antidiabetic drugs: Secondary | ICD-10-CM

## 2024-09-18 DIAGNOSIS — I10 Essential (primary) hypertension: Secondary | ICD-10-CM | POA: Diagnosis not present

## 2024-09-18 MED ORDER — AZITHROMYCIN 250 MG PO TABS: ORAL_TABLET | ORAL | 1 refills | Status: AC

## 2024-09-18 MED ORDER — HYDROCODONE BIT-HOMATROP MBR 5-1.5 MG/5ML PO SOLN: 5.0000 mL | Freq: Four times a day (QID) | ORAL | 0 refills | Status: AC | PRN

## 2024-09-18 NOTE — Progress Notes (Signed)
 Patient ID: Martin Lozano, male   DOB: Jul 19, 1961, 63 y.o.   MRN: [id number]        Chief Complaint: follow up sinusitis, dm , htn, low b12 and D,        HPI:  Martin Lozano is a 63 y.o. male  Here with 2-3 days acute onset fever, facial pain, pressure, headache, general weakness and malaise, and greenish d/c, with mild ST and cough, but pt denies chest pain, wheezing, increased sob or doe, orthopnea, PND, increased LE swelling, palpitations, dizziness or syncope.   Pt denies polydipsia, polyuria, or new focal neuro s/s.    Pt denies fever, wt loss, night sweats, loss of appetite, or other constitutional symptoms  Has contd to gain wt.  BP has been controlled at home per pt       Wt Readings from Last 3 Encounters:  09/18/24 (!) 360 lb (163.3 kg)  06/18/24 (!) 359 lb (162.8 kg)  01/30/24 (!) 344 lb (156 kg)   BP Readings from Last 3 Encounters:  09/18/24 130/78  06/18/24 (!) 148/82  01/30/24 128/76         Past Medical History:  Diagnosis Date   ALLERGIC RHINITIS 09/06/2007   ASTHMATIC BRONCHITIS, ACUTE 11/16/2007   HEMORRHOIDS 07/03/2007   HYPERLIPIDEMIA 09/06/2007   on meds   HYPERTENSION 07/03/2007   on meds   KNEE SPRAIN, LEFT 07/30/2008   OBESITY, MORBID 07/03/2007   Other tenosynovitis of hand and wrist 06/11/2010   OTITIS MEDIA, ACUTE, LEFT 11/10/2009   Type II or unspecified type diabetes mellitus without mention of complication, uncontrolled 09/25/2012   on meds   Umbilical hernia    Wheezing 09/09/2009   Past Surgical History:  Procedure Laterality Date   EXTRACORPOREAL SHOCK WAVE LITHOTRIPSY Left 07/28/2020   Procedure: LEFT EXTRACORPOREAL SHOCK WAVE LITHOTRIPSY (ESWL);  Surgeon: Nieves Cough, MD;  Location: [facility];  Service: Urology;  Laterality: Left;   WISDOM TOOTH EXTRACTION      reports that he has never smoked. He has never used smokeless tobacco. He reports that he does not currently use alcohol. He reports that he does not use  drugs. family history includes Colon polyps (age of onset: 55) in his brother. Allergies[1] Medications Ordered Prior to Encounter[2]      ROS:  All others reviewed and negative.  Objective        PE:  BP 130/78 (BP Location: Right Arm, Patient Position: Sitting, Cuff Size: Normal)   Pulse 77   Temp 98.8 F (37.1 C) (Oral)   Ht 5' 11 (1.803 m)   Wt (!) 360 lb (163.3 kg)   SpO2 100%   BMI 50.21 kg/m                 Constitutional: Pt appears mild ill               HENT: Head: NCAT.                Right Ear: External ear normal.                 Left Ear: External ear normal. Bilat tm's with mild erythema.  Max sinus areas mild tender.  Pharynx with mild erythema, no exudate               Eyes: . Pupils are equal, round, and reactive to light. Conjunctivae and EOM are normal  Nose: without d/c or deformity               Neck: Neck supple. Gross normal ROM               Cardiovascular: Normal rate and regular rhythm.                 Pulmonary/Chest: Effort normal and breath sounds without rales or wheezing.                               Neurological: Pt is alert. At baseline orientation, motor grossly intact               Skin: Skin is warm. No rashes, no other new lesions, LE edema - none               Psychiatric: Pt behavior is normal without agitation   Micro: none  Cardiac tracings I have personally interpreted today:  none  Pertinent Radiological findings (summarize): none   Lab Results  Component Value Date   WBC 8.4 11/07/2023   HGB 13.7 11/07/2023   HCT 41.8 11/07/2023   PLT 226.0 11/07/2023   GLUCOSE 89 11/07/2023   CHOL 121 11/07/2023   TRIG 139.0 11/07/2023   HDL 38.00 (L) 11/07/2023   LDLDIRECT 88.0 11/02/2018   LDLCALC 55 11/07/2023   ALT 15 11/07/2023   AST 15 11/07/2023   NA 138 11/07/2023   K 4.5 11/07/2023   CL 103 11/07/2023   CREATININE 1.10 11/07/2023   BUN 17 11/07/2023   CO2 26 11/07/2023   TSH 2.11 11/07/2023   PSA 0.41  11/07/2023   HGBA1C 5.8 11/07/2023   Assessment/Plan:  Martin Lozano is a 63 y.o. White or Caucasian [1] male with  has a past medical history of ALLERGIC RHINITIS (09/06/2007), ASTHMATIC BRONCHITIS, ACUTE (11/16/2007), HEMORRHOIDS (07/03/2007), HYPERLIPIDEMIA (09/06/2007), HYPERTENSION (07/03/2007), KNEE SPRAIN, LEFT (07/30/2008), OBESITY, MORBID (07/03/2007), Other tenosynovitis of hand and wrist (06/11/2010), OTITIS MEDIA, ACUTE, LEFT (11/10/2009), Type II or unspecified type diabetes mellitus without mention of complication, uncontrolled (09/25/2012), Umbilical hernia, and Wheezing (09/09/2009).  Vitamin D  deficiency Last vitamin D  Lab Results  Component Value Date   VD25OH 60.74 11/07/2023   Stable, cont oral replacement   Vitamin B12 deficiency Lab Results  Component Value Date   VITAMINB12 824 11/07/2023   Stable, cont oral replacement - b12 1000 mcg qd   Sinusitis Mild to mod, for antibx course zpack, cough med prn,  to f/u any worsening symptoms or concerns  Essential hypertension BP Readings from Last 3 Encounters:  09/18/24 130/78  06/18/24 (!) 148/82  01/30/24 128/76   Stable, pt to continue medical treatment lisinopril  20 mg qd   Diabetes (HCC) Lab Results  Component Value Date   HGBA1C 5.8 11/07/2023   With worsening wt gain, pt to continue current medical treatment glucotrol  xl 10 every day, metformin  1000 bid, actos  30 every day, mounjaro  2.5 mg every day - declines other change for now  Followup: Return if symptoms worsen or fail to improve.  Martin Rush, MD 09/18/2024 6:14 PM [facility]     [1] No Known Allergies [2]  Current Outpatient Medications on File Prior to Visit  Medication Sig Dispense Refill   albuterol  (VENTOLIN  HFA) 108 (90 Base) MCG/ACT inhaler Inhale 2 puffs into the lungs every 6 (six) hours as needed for wheezing or shortness  of breath. 8 g 0   aspirin  81 MG  tablet Take 81 mg by mouth daily.     Cholecalciferol (THERA-D 2000) 50 MCG (2000 UT) TABS 1 tab by mouth once daily 30 tablet 99   [facility] VITAMIN B12 1000 MCG tablet TAKE 1 TABLET BY MOUTH EVERY DAY 100 tablet 1   doxycycline  (VIBRA -TABS) 100 MG tablet Take 1 tablet (100 mg total) by mouth 2 (two) times daily. 20 tablet 0   glipiZIDE  (GLUCOTROL  XL) 10 MG 24 hr tablet TAKE 1 TABLET (10 MG TOTAL) BY MOUTH DAILY WITH BREAKFAST. 90 tablet 3   ibuprofen  (ADVIL ) 800 MG tablet Take 1 tablet (800 mg total) by mouth every 8 (eight) hours as needed. 40 tablet 1   lisinopril  (ZESTRIL ) 20 MG tablet TAKE 1 TABLET BY MOUTH EVERY DAY 90 tablet 1   meloxicam  (MOBIC ) 15 MG tablet TAKE 1 TABLET BY MOUTH EVERY DAY AS NEEDED FOR PAIN 30 tablet 5   metFORMIN  (GLUCOPHAGE ) 500 MG tablet TAKE 2 TABLETS BY MOUTH IN THE MORNING AND 2 TABLETS IN THE EVENING 360 tablet 3   oxyCODONE-acetaminophen  (PERCOCET/ROXICET) 5-325 MG tablet Take 1 tablet by mouth every 6 (six) hours as needed.     pioglitazone  (ACTOS ) 30 MG tablet TAKE 1 TABLET BY MOUTH EVERY DAY 90 tablet 3   predniSONE  (DELTASONE ) 10 MG tablet 3 tabs by mouth per day for 3 days,2tabs per day for 3 days,1tab per day for 3 days 18 tablet 0   rosuvastatin  (CRESTOR ) 20 MG tablet TAKE 1 TABLET BY MOUTH EVERY DAY 90 tablet 3   tamsulosin  (FLOMAX ) 0.4 MG CAPS capsule Take by mouth.     tirzepatide  (MOUNJARO ) 2.5 MG/0.5ML Pen Inject 2.5 mg into the skin once a week. E11.9 2 mL 11   No current facility-administered medications on file prior to visit.

## 2024-09-18 NOTE — Assessment & Plan Note (Signed)
 Lab Results  Component Value Date   VITAMINB12 824 11/07/2023   Stable, cont oral replacement - b12 1000 mcg qd

## 2024-09-18 NOTE — Assessment & Plan Note (Signed)
Mild to mod, for antibx course zpack, cough med prn,  to f/u any worsening symptoms or concerns 

## 2024-09-18 NOTE — Assessment & Plan Note (Addendum)
 Lab Results  Component Value Date   HGBA1C 5.8 11/07/2023   With worsening wt gain, pt to continue current medical treatment glucotrol  xl 10 every day, metformin  1000 bid, actos  30 every day, mounjaro  2.5 mg every day - declines other change for now

## 2024-09-18 NOTE — Assessment & Plan Note (Signed)
 Last vitamin D Lab Results  Component Value Date   VD25OH 60.74 11/07/2023   Stable, cont oral replacement

## 2024-09-18 NOTE — Assessment & Plan Note (Signed)
 BP Readings from Last 3 Encounters:  09/18/24 130/78  06/18/24 (!) 148/82  01/30/24 128/76   Stable, pt to continue medical treatment lisinopril  20 mg qd

## 2024-09-18 NOTE — Patient Instructions (Signed)
 Please take all new medication as prescribed - the antibiotic, and cough medicine as needed  Please continue all other medications as before, and refills have been done if requested.  Please have the pharmacy call with any other refills you may need.  Please keep your appointments with your specialists as you may have planned

## 2024-09-19 ENCOUNTER — Other Ambulatory Visit: Payer: Self-pay

## 2024-09-19 ENCOUNTER — Other Ambulatory Visit: Payer: Self-pay | Admitting: Internal Medicine

## 2024-09-21 ENCOUNTER — Other Ambulatory Visit: Payer: Self-pay | Admitting: Internal Medicine

## 2024-09-21 ENCOUNTER — Other Ambulatory Visit: Payer: Self-pay

## 2024-09-27 ENCOUNTER — Other Ambulatory Visit: Payer: Self-pay | Admitting: Internal Medicine

## 2024-09-29 ENCOUNTER — Other Ambulatory Visit: Payer: Self-pay | Admitting: Internal Medicine

## 2024-10-01 ENCOUNTER — Other Ambulatory Visit: Payer: Self-pay
